# Patient Record
Sex: Male | Born: 1950 | Race: White | Hispanic: No | Smoking: Never smoker
Health system: Southern US, Community
[De-identification: ages and names within clinical notes are randomized; demographics above are authoritative.]

## PROBLEM LIST (undated history)

## (undated) DIAGNOSIS — E119 Type 2 diabetes mellitus without complications: Secondary | ICD-10-CM

## (undated) DIAGNOSIS — F419 Anxiety disorder, unspecified: Secondary | ICD-10-CM

## (undated) HISTORY — DX: Type 2 diabetes mellitus without complications: E11.9

## (undated) HISTORY — DX: Anxiety disorder, unspecified: F41.9

---

## 2000-09-24 ENCOUNTER — Encounter: Payer: Self-pay | Admitting: Nephrology

## 2000-09-24 ENCOUNTER — Encounter: Admission: RE | Admit: 2000-09-24 | Discharge: 2000-09-24 | Payer: Self-pay | Admitting: Nephrology

## 2000-10-01 ENCOUNTER — Encounter: Payer: Self-pay | Admitting: Nephrology

## 2000-10-01 ENCOUNTER — Ambulatory Visit (HOSPITAL_COMMUNITY): Admission: RE | Admit: 2000-10-01 | Discharge: 2000-10-01 | Payer: Self-pay | Admitting: Nephrology

## 2001-08-14 ENCOUNTER — Encounter (HOSPITAL_BASED_OUTPATIENT_CLINIC_OR_DEPARTMENT_OTHER): Payer: Self-pay | Admitting: General Surgery

## 2001-08-19 ENCOUNTER — Ambulatory Visit (HOSPITAL_COMMUNITY): Admission: RE | Admit: 2001-08-19 | Discharge: 2001-08-19 | Payer: Self-pay | Admitting: General Surgery

## 2001-09-13 ENCOUNTER — Encounter: Admission: RE | Admit: 2001-09-13 | Discharge: 2001-09-13 | Payer: Self-pay | Admitting: Nephrology

## 2001-09-13 ENCOUNTER — Encounter: Payer: Self-pay | Admitting: Nephrology

## 2001-10-11 ENCOUNTER — Encounter: Admission: RE | Admit: 2001-10-11 | Discharge: 2001-10-11 | Payer: Self-pay | Admitting: Nephrology

## 2001-10-11 ENCOUNTER — Encounter: Payer: Self-pay | Admitting: Nephrology

## 2011-02-23 ENCOUNTER — Other Ambulatory Visit (HOSPITAL_COMMUNITY): Payer: Self-pay | Admitting: Urology

## 2011-02-23 DIAGNOSIS — N281 Cyst of kidney, acquired: Secondary | ICD-10-CM

## 2011-02-25 ENCOUNTER — Ambulatory Visit (HOSPITAL_COMMUNITY)
Admission: RE | Admit: 2011-02-25 | Discharge: 2011-02-25 | Disposition: A | Discharge status: Home / Self Care | Payer: BC Managed Care – PPO | Source: Ambulatory Visit | Attending: Urology | Admitting: Urology

## 2011-02-25 DIAGNOSIS — K8020 Calculus of gallbladder without cholecystitis without obstruction: Secondary | ICD-10-CM | POA: Insufficient documentation

## 2011-02-25 DIAGNOSIS — Q619 Cystic kidney disease, unspecified: Secondary | ICD-10-CM | POA: Insufficient documentation

## 2011-02-25 DIAGNOSIS — N281 Cyst of kidney, acquired: Secondary | ICD-10-CM

## 2011-02-25 DIAGNOSIS — K7689 Other specified diseases of liver: Secondary | ICD-10-CM | POA: Insufficient documentation

## 2011-02-25 MED ORDER — GADOBENATE DIMEGLUMINE 529 MG/ML IV SOLN
20.0000 mL | Freq: Once | INTRAVENOUS | Status: AC | PRN
  Administered 2011-02-25: 20 mL via INTRAVENOUS

## 2014-03-24 DIAGNOSIS — E785 Hyperlipidemia, unspecified: Secondary | ICD-10-CM | POA: Diagnosis not present

## 2014-03-24 DIAGNOSIS — E119 Type 2 diabetes mellitus without complications: Secondary | ICD-10-CM | POA: Diagnosis not present

## 2014-03-26 DIAGNOSIS — I10 Essential (primary) hypertension: Secondary | ICD-10-CM | POA: Diagnosis not present

## 2014-03-26 DIAGNOSIS — E119 Type 2 diabetes mellitus without complications: Secondary | ICD-10-CM | POA: Diagnosis not present

## 2014-03-26 DIAGNOSIS — E785 Hyperlipidemia, unspecified: Secondary | ICD-10-CM | POA: Diagnosis not present

## 2014-06-23 DIAGNOSIS — Z79899 Other long term (current) drug therapy: Secondary | ICD-10-CM | POA: Diagnosis not present

## 2014-06-23 DIAGNOSIS — E119 Type 2 diabetes mellitus without complications: Secondary | ICD-10-CM | POA: Diagnosis not present

## 2014-06-23 DIAGNOSIS — E785 Hyperlipidemia, unspecified: Secondary | ICD-10-CM | POA: Diagnosis not present

## 2014-06-25 DIAGNOSIS — E119 Type 2 diabetes mellitus without complications: Secondary | ICD-10-CM | POA: Diagnosis not present

## 2014-06-25 DIAGNOSIS — Z23 Encounter for immunization: Secondary | ICD-10-CM | POA: Diagnosis not present

## 2014-06-25 DIAGNOSIS — Z79899 Other long term (current) drug therapy: Secondary | ICD-10-CM | POA: Diagnosis not present

## 2014-06-25 DIAGNOSIS — E782 Mixed hyperlipidemia: Secondary | ICD-10-CM | POA: Diagnosis not present

## 2014-06-25 DIAGNOSIS — I10 Essential (primary) hypertension: Secondary | ICD-10-CM | POA: Diagnosis not present

## 2014-09-03 DIAGNOSIS — Z23 Encounter for immunization: Secondary | ICD-10-CM | POA: Diagnosis not present

## 2014-09-03 DIAGNOSIS — K219 Gastro-esophageal reflux disease without esophagitis: Secondary | ICD-10-CM | POA: Diagnosis not present

## 2014-09-03 DIAGNOSIS — Z125 Encounter for screening for malignant neoplasm of prostate: Secondary | ICD-10-CM | POA: Diagnosis not present

## 2014-09-03 DIAGNOSIS — Z0000 Encounter for general adult medical examination without abnormal findings: Secondary | ICD-10-CM | POA: Diagnosis not present

## 2014-09-03 DIAGNOSIS — Z1211 Encounter for screening for malignant neoplasm of colon: Secondary | ICD-10-CM | POA: Diagnosis not present

## 2014-09-03 DIAGNOSIS — F419 Anxiety disorder, unspecified: Secondary | ICD-10-CM | POA: Diagnosis not present

## 2014-09-14 DIAGNOSIS — Z1211 Encounter for screening for malignant neoplasm of colon: Secondary | ICD-10-CM | POA: Diagnosis not present

## 2014-09-29 DIAGNOSIS — E782 Mixed hyperlipidemia: Secondary | ICD-10-CM | POA: Diagnosis not present

## 2014-09-29 DIAGNOSIS — E119 Type 2 diabetes mellitus without complications: Secondary | ICD-10-CM | POA: Diagnosis not present

## 2014-10-01 DIAGNOSIS — Z79899 Other long term (current) drug therapy: Secondary | ICD-10-CM | POA: Diagnosis not present

## 2014-10-01 DIAGNOSIS — I10 Essential (primary) hypertension: Secondary | ICD-10-CM | POA: Diagnosis not present

## 2014-10-01 DIAGNOSIS — E119 Type 2 diabetes mellitus without complications: Secondary | ICD-10-CM | POA: Diagnosis not present

## 2014-10-01 DIAGNOSIS — E782 Mixed hyperlipidemia: Secondary | ICD-10-CM | POA: Diagnosis not present

## 2014-12-29 DIAGNOSIS — E782 Mixed hyperlipidemia: Secondary | ICD-10-CM | POA: Diagnosis not present

## 2014-12-29 DIAGNOSIS — E119 Type 2 diabetes mellitus without complications: Secondary | ICD-10-CM | POA: Diagnosis not present

## 2015-01-01 DIAGNOSIS — Z79899 Other long term (current) drug therapy: Secondary | ICD-10-CM | POA: Diagnosis not present

## 2015-01-01 DIAGNOSIS — E782 Mixed hyperlipidemia: Secondary | ICD-10-CM | POA: Diagnosis not present

## 2015-01-01 DIAGNOSIS — I10 Essential (primary) hypertension: Secondary | ICD-10-CM | POA: Diagnosis not present

## 2015-01-01 DIAGNOSIS — E119 Type 2 diabetes mellitus without complications: Secondary | ICD-10-CM | POA: Diagnosis not present

## 2015-03-30 DIAGNOSIS — E782 Mixed hyperlipidemia: Secondary | ICD-10-CM | POA: Diagnosis not present

## 2015-03-30 DIAGNOSIS — E119 Type 2 diabetes mellitus without complications: Secondary | ICD-10-CM | POA: Diagnosis not present

## 2015-04-02 DIAGNOSIS — Z79899 Other long term (current) drug therapy: Secondary | ICD-10-CM | POA: Diagnosis not present

## 2015-04-02 DIAGNOSIS — E119 Type 2 diabetes mellitus without complications: Secondary | ICD-10-CM | POA: Diagnosis not present

## 2015-04-02 DIAGNOSIS — I10 Essential (primary) hypertension: Secondary | ICD-10-CM | POA: Diagnosis not present

## 2015-04-02 DIAGNOSIS — E782 Mixed hyperlipidemia: Secondary | ICD-10-CM | POA: Diagnosis not present

## 2015-06-30 DIAGNOSIS — E782 Mixed hyperlipidemia: Secondary | ICD-10-CM | POA: Diagnosis not present

## 2015-06-30 DIAGNOSIS — E119 Type 2 diabetes mellitus without complications: Secondary | ICD-10-CM | POA: Diagnosis not present

## 2015-07-02 DIAGNOSIS — Z23 Encounter for immunization: Secondary | ICD-10-CM | POA: Diagnosis not present

## 2015-07-02 DIAGNOSIS — I10 Essential (primary) hypertension: Secondary | ICD-10-CM | POA: Diagnosis not present

## 2015-07-02 DIAGNOSIS — E119 Type 2 diabetes mellitus without complications: Secondary | ICD-10-CM | POA: Diagnosis not present

## 2015-07-02 DIAGNOSIS — E782 Mixed hyperlipidemia: Secondary | ICD-10-CM | POA: Diagnosis not present

## 2015-07-02 DIAGNOSIS — Z79899 Other long term (current) drug therapy: Secondary | ICD-10-CM | POA: Diagnosis not present

## 2015-09-15 DIAGNOSIS — Z1211 Encounter for screening for malignant neoplasm of colon: Secondary | ICD-10-CM | POA: Diagnosis not present

## 2015-09-15 DIAGNOSIS — Z0000 Encounter for general adult medical examination without abnormal findings: Secondary | ICD-10-CM | POA: Diagnosis not present

## 2015-09-15 DIAGNOSIS — K219 Gastro-esophageal reflux disease without esophagitis: Secondary | ICD-10-CM | POA: Diagnosis not present

## 2015-09-15 DIAGNOSIS — Z125 Encounter for screening for malignant neoplasm of prostate: Secondary | ICD-10-CM | POA: Diagnosis not present

## 2015-09-15 DIAGNOSIS — Z6837 Body mass index (BMI) 37.0-37.9, adult: Secondary | ICD-10-CM | POA: Diagnosis not present

## 2015-09-15 DIAGNOSIS — R109 Unspecified abdominal pain: Secondary | ICD-10-CM | POA: Diagnosis not present

## 2015-09-15 DIAGNOSIS — E669 Obesity, unspecified: Secondary | ICD-10-CM | POA: Diagnosis not present

## 2015-09-15 DIAGNOSIS — F419 Anxiety disorder, unspecified: Secondary | ICD-10-CM | POA: Diagnosis not present

## 2015-09-23 DIAGNOSIS — Z1211 Encounter for screening for malignant neoplasm of colon: Secondary | ICD-10-CM | POA: Diagnosis not present

## 2015-09-28 DIAGNOSIS — E782 Mixed hyperlipidemia: Secondary | ICD-10-CM | POA: Diagnosis not present

## 2015-09-28 DIAGNOSIS — E119 Type 2 diabetes mellitus without complications: Secondary | ICD-10-CM | POA: Diagnosis not present

## 2015-09-30 DIAGNOSIS — I10 Essential (primary) hypertension: Secondary | ICD-10-CM | POA: Diagnosis not present

## 2015-09-30 DIAGNOSIS — E782 Mixed hyperlipidemia: Secondary | ICD-10-CM | POA: Diagnosis not present

## 2015-09-30 DIAGNOSIS — E119 Type 2 diabetes mellitus without complications: Secondary | ICD-10-CM | POA: Diagnosis not present

## 2015-09-30 DIAGNOSIS — Z79899 Other long term (current) drug therapy: Secondary | ICD-10-CM | POA: Diagnosis not present

## 2015-12-28 DIAGNOSIS — Z79899 Other long term (current) drug therapy: Secondary | ICD-10-CM | POA: Diagnosis not present

## 2015-12-28 DIAGNOSIS — E119 Type 2 diabetes mellitus without complications: Secondary | ICD-10-CM | POA: Diagnosis not present

## 2015-12-28 DIAGNOSIS — E782 Mixed hyperlipidemia: Secondary | ICD-10-CM | POA: Diagnosis not present

## 2015-12-30 DIAGNOSIS — E782 Mixed hyperlipidemia: Secondary | ICD-10-CM | POA: Diagnosis not present

## 2015-12-30 DIAGNOSIS — E119 Type 2 diabetes mellitus without complications: Secondary | ICD-10-CM | POA: Diagnosis not present

## 2015-12-30 DIAGNOSIS — I10 Essential (primary) hypertension: Secondary | ICD-10-CM | POA: Diagnosis not present

## 2015-12-30 DIAGNOSIS — Z79899 Other long term (current) drug therapy: Secondary | ICD-10-CM | POA: Diagnosis not present

## 2016-02-03 ENCOUNTER — Ambulatory Visit (INDEPENDENT_AMBULATORY_CARE_PROVIDER_SITE_OTHER): Payer: Medicare Other | Admitting: Podiatry

## 2016-02-03 ENCOUNTER — Encounter: Payer: Self-pay | Admitting: Podiatry

## 2016-02-03 VITALS — BP 135/82 | HR 74 | Resp 12

## 2016-02-03 DIAGNOSIS — L603 Nail dystrophy: Secondary | ICD-10-CM

## 2016-02-03 DIAGNOSIS — B351 Tinea unguium: Secondary | ICD-10-CM | POA: Diagnosis not present

## 2016-02-03 NOTE — Progress Notes (Signed)
   Subjective:    Patient ID: Joe FischerDavid H [name], male    DOB: June 23, 1951, 65 y.o.   MRN: [id number]  HPI: He presents today as a 65 year old white male with a five-year history of diabetes concerned about the discoloration of the hallux nail right. He states that this started approximately 2 years ago with self-inflicted injury to both nail plates bilateral hallux resulting in the nails falling off. He states the left one grew back normal the right one grew back abnormal. He states that it does not hurt however he is concerned about the discoloration. He has tried topicals which seemed to work better on the left nail that has done nothing for the right now. These topicals were over-the-counter.  Review of Systems  Skin: Positive for color change.       Objective:   Physical Exam: Vital signs are stable he is alert and oriented 3. He is in good spirits. Well-nourished. No acute distress. Pulses are strongly palpable. Neurologic sensorium is intact. Deep tendon reflexes are intact. Muscle strength is normal bilateral. Orthopedic evaluation of his resolved joints distal to the ankle level for range of motion without crepitation. Cutaneous evaluation demonstrates supple well-hydrated cutis no erythema edema cellulitis drainage or odor. Hallux nail plate right does demonstrate distal onycholysis with thickening of the nail however the nail itself is irregular there is no surrounding soft tissue involvement such as fungus yeast or mold. Diabetic radiographs were deferred today.        Assessment & Plan:  Assessment: Nail dystrophy hallux right. Diabetes mellitus.  Plan: We took a sample of the skin and nail to be sent for pathologic evaluation I will follow-up with him once his results have returned.

## 2016-02-04 NOTE — Addendum Note (Signed)
Addended by: Alphia Kava'CONNELL, VALERY D on: 02/04/2016 09:22 AM   Modules accepted: Orders

## 2016-03-02 ENCOUNTER — Ambulatory Visit (INDEPENDENT_AMBULATORY_CARE_PROVIDER_SITE_OTHER): Payer: Medicare Other | Admitting: Podiatry

## 2016-03-02 ENCOUNTER — Encounter: Payer: Self-pay | Admitting: Podiatry

## 2016-03-02 DIAGNOSIS — L603 Nail dystrophy: Secondary | ICD-10-CM | POA: Diagnosis not present

## 2016-03-02 NOTE — Patient Instructions (Signed)

## 2016-03-02 NOTE — Progress Notes (Signed)
He presents today for his pathology reports regarding his hallux nail plate right foot.  Objective: Vital signs are stable he is alert and oriented 3. Pathology report does demonstrate mold hallux right.  Assessment: Onychomycosis hallux right.  Plan: At this point he will see do nothing and will consider oral treatment later.

## 2016-03-10 ENCOUNTER — Encounter: Payer: Self-pay | Admitting: Podiatry

## 2016-04-19 DIAGNOSIS — E782 Mixed hyperlipidemia: Secondary | ICD-10-CM | POA: Diagnosis not present

## 2016-04-19 DIAGNOSIS — E119 Type 2 diabetes mellitus without complications: Secondary | ICD-10-CM | POA: Diagnosis not present

## 2016-04-21 DIAGNOSIS — E119 Type 2 diabetes mellitus without complications: Secondary | ICD-10-CM | POA: Diagnosis not present

## 2016-04-21 DIAGNOSIS — I10 Essential (primary) hypertension: Secondary | ICD-10-CM | POA: Diagnosis not present

## 2016-04-21 DIAGNOSIS — Z79899 Other long term (current) drug therapy: Secondary | ICD-10-CM | POA: Diagnosis not present

## 2016-04-21 DIAGNOSIS — E782 Mixed hyperlipidemia: Secondary | ICD-10-CM | POA: Diagnosis not present

## 2016-05-20 DIAGNOSIS — M7711 Lateral epicondylitis, right elbow: Secondary | ICD-10-CM | POA: Diagnosis not present

## 2016-07-25 DIAGNOSIS — E782 Mixed hyperlipidemia: Secondary | ICD-10-CM | POA: Diagnosis not present

## 2016-07-25 DIAGNOSIS — E119 Type 2 diabetes mellitus without complications: Secondary | ICD-10-CM | POA: Diagnosis not present

## 2016-07-27 DIAGNOSIS — Z23 Encounter for immunization: Secondary | ICD-10-CM | POA: Diagnosis not present

## 2016-07-27 DIAGNOSIS — I10 Essential (primary) hypertension: Secondary | ICD-10-CM | POA: Diagnosis not present

## 2016-07-27 DIAGNOSIS — Z79899 Other long term (current) drug therapy: Secondary | ICD-10-CM | POA: Diagnosis not present

## 2016-07-27 DIAGNOSIS — E119 Type 2 diabetes mellitus without complications: Secondary | ICD-10-CM | POA: Diagnosis not present

## 2016-07-27 DIAGNOSIS — E782 Mixed hyperlipidemia: Secondary | ICD-10-CM | POA: Diagnosis not present

## 2016-09-22 DIAGNOSIS — F419 Anxiety disorder, unspecified: Secondary | ICD-10-CM | POA: Diagnosis not present

## 2016-09-22 DIAGNOSIS — Z23 Encounter for immunization: Secondary | ICD-10-CM | POA: Diagnosis not present

## 2016-09-22 DIAGNOSIS — K219 Gastro-esophageal reflux disease without esophagitis: Secondary | ICD-10-CM | POA: Diagnosis not present

## 2016-09-22 DIAGNOSIS — Z0000 Encounter for general adult medical examination without abnormal findings: Secondary | ICD-10-CM | POA: Diagnosis not present

## 2016-09-22 DIAGNOSIS — M2550 Pain in unspecified joint: Secondary | ICD-10-CM | POA: Diagnosis not present

## 2016-09-22 DIAGNOSIS — E669 Obesity, unspecified: Secondary | ICD-10-CM | POA: Diagnosis not present

## 2016-09-22 DIAGNOSIS — Z125 Encounter for screening for malignant neoplasm of prostate: Secondary | ICD-10-CM | POA: Diagnosis not present

## 2016-09-27 DIAGNOSIS — Z1211 Encounter for screening for malignant neoplasm of colon: Secondary | ICD-10-CM | POA: Diagnosis not present

## 2016-09-27 DIAGNOSIS — K5730 Diverticulosis of large intestine without perforation or abscess without bleeding: Secondary | ICD-10-CM | POA: Diagnosis not present

## 2016-10-31 DIAGNOSIS — Z79899 Other long term (current) drug therapy: Secondary | ICD-10-CM | POA: Diagnosis not present

## 2016-10-31 DIAGNOSIS — E782 Mixed hyperlipidemia: Secondary | ICD-10-CM | POA: Diagnosis not present

## 2016-10-31 DIAGNOSIS — E119 Type 2 diabetes mellitus without complications: Secondary | ICD-10-CM | POA: Diagnosis not present

## 2016-11-02 DIAGNOSIS — Z79899 Other long term (current) drug therapy: Secondary | ICD-10-CM | POA: Insufficient documentation

## 2016-11-02 DIAGNOSIS — E119 Type 2 diabetes mellitus without complications: Secondary | ICD-10-CM | POA: Insufficient documentation

## 2016-11-02 DIAGNOSIS — I10 Essential (primary) hypertension: Secondary | ICD-10-CM | POA: Insufficient documentation

## 2016-11-02 DIAGNOSIS — E782 Mixed hyperlipidemia: Secondary | ICD-10-CM | POA: Diagnosis not present

## 2016-11-02 DIAGNOSIS — E669 Obesity, unspecified: Secondary | ICD-10-CM | POA: Diagnosis not present

## 2016-11-02 DIAGNOSIS — E1165 Type 2 diabetes mellitus with hyperglycemia: Secondary | ICD-10-CM | POA: Insufficient documentation

## 2016-11-02 DIAGNOSIS — Z794 Long term (current) use of insulin: Secondary | ICD-10-CM | POA: Diagnosis not present

## 2016-11-02 DIAGNOSIS — Z6839 Body mass index (BMI) 39.0-39.9, adult: Secondary | ICD-10-CM | POA: Diagnosis not present

## 2017-02-06 DIAGNOSIS — E119 Type 2 diabetes mellitus without complications: Secondary | ICD-10-CM | POA: Diagnosis not present

## 2017-02-06 DIAGNOSIS — E782 Mixed hyperlipidemia: Secondary | ICD-10-CM | POA: Diagnosis not present

## 2017-02-08 DIAGNOSIS — E782 Mixed hyperlipidemia: Secondary | ICD-10-CM | POA: Diagnosis not present

## 2017-02-08 DIAGNOSIS — I10 Essential (primary) hypertension: Secondary | ICD-10-CM | POA: Diagnosis not present

## 2017-02-08 DIAGNOSIS — Z79899 Other long term (current) drug therapy: Secondary | ICD-10-CM | POA: Diagnosis not present

## 2017-02-08 DIAGNOSIS — Z6838 Body mass index (BMI) 38.0-38.9, adult: Secondary | ICD-10-CM | POA: Diagnosis not present

## 2017-02-08 DIAGNOSIS — E669 Obesity, unspecified: Secondary | ICD-10-CM | POA: Diagnosis not present

## 2017-02-08 DIAGNOSIS — E119 Type 2 diabetes mellitus without complications: Secondary | ICD-10-CM | POA: Diagnosis not present

## 2017-05-22 DIAGNOSIS — E782 Mixed hyperlipidemia: Secondary | ICD-10-CM | POA: Diagnosis not present

## 2017-05-22 DIAGNOSIS — E119 Type 2 diabetes mellitus without complications: Secondary | ICD-10-CM | POA: Diagnosis not present

## 2017-05-25 DIAGNOSIS — Z79899 Other long term (current) drug therapy: Secondary | ICD-10-CM | POA: Diagnosis not present

## 2017-05-25 DIAGNOSIS — Z6835 Body mass index (BMI) 35.0-35.9, adult: Secondary | ICD-10-CM | POA: Diagnosis not present

## 2017-05-25 DIAGNOSIS — I10 Essential (primary) hypertension: Secondary | ICD-10-CM | POA: Diagnosis not present

## 2017-05-25 DIAGNOSIS — E782 Mixed hyperlipidemia: Secondary | ICD-10-CM | POA: Diagnosis not present

## 2017-05-25 DIAGNOSIS — E119 Type 2 diabetes mellitus without complications: Secondary | ICD-10-CM | POA: Diagnosis not present

## 2017-05-25 DIAGNOSIS — E669 Obesity, unspecified: Secondary | ICD-10-CM | POA: Diagnosis not present

## 2017-05-25 DIAGNOSIS — Z23 Encounter for immunization: Secondary | ICD-10-CM | POA: Diagnosis not present

## 2017-08-23 DIAGNOSIS — E119 Type 2 diabetes mellitus without complications: Secondary | ICD-10-CM | POA: Diagnosis not present

## 2017-08-23 DIAGNOSIS — E782 Mixed hyperlipidemia: Secondary | ICD-10-CM | POA: Diagnosis not present

## 2017-08-31 DIAGNOSIS — E782 Mixed hyperlipidemia: Secondary | ICD-10-CM | POA: Diagnosis not present

## 2017-08-31 DIAGNOSIS — I10 Essential (primary) hypertension: Secondary | ICD-10-CM | POA: Diagnosis not present

## 2017-08-31 DIAGNOSIS — Z79899 Other long term (current) drug therapy: Secondary | ICD-10-CM | POA: Diagnosis not present

## 2017-08-31 DIAGNOSIS — E119 Type 2 diabetes mellitus without complications: Secondary | ICD-10-CM | POA: Diagnosis not present

## 2017-10-12 DIAGNOSIS — Z0000 Encounter for general adult medical examination without abnormal findings: Secondary | ICD-10-CM | POA: Diagnosis not present

## 2017-10-12 DIAGNOSIS — K219 Gastro-esophageal reflux disease without esophagitis: Secondary | ICD-10-CM | POA: Diagnosis not present

## 2017-10-12 DIAGNOSIS — Z1159 Encounter for screening for other viral diseases: Secondary | ICD-10-CM | POA: Diagnosis not present

## 2017-10-12 DIAGNOSIS — E669 Obesity, unspecified: Secondary | ICD-10-CM | POA: Diagnosis not present

## 2017-10-12 DIAGNOSIS — F419 Anxiety disorder, unspecified: Secondary | ICD-10-CM | POA: Diagnosis not present

## 2017-10-12 DIAGNOSIS — Z125 Encounter for screening for malignant neoplasm of prostate: Secondary | ICD-10-CM | POA: Diagnosis not present

## 2017-10-12 DIAGNOSIS — M2550 Pain in unspecified joint: Secondary | ICD-10-CM | POA: Diagnosis not present

## 2017-11-26 DIAGNOSIS — E782 Mixed hyperlipidemia: Secondary | ICD-10-CM | POA: Diagnosis not present

## 2017-11-26 DIAGNOSIS — E119 Type 2 diabetes mellitus without complications: Secondary | ICD-10-CM | POA: Diagnosis not present

## 2017-11-30 DIAGNOSIS — Z79899 Other long term (current) drug therapy: Secondary | ICD-10-CM | POA: Diagnosis not present

## 2017-11-30 DIAGNOSIS — E119 Type 2 diabetes mellitus without complications: Secondary | ICD-10-CM | POA: Diagnosis not present

## 2017-11-30 DIAGNOSIS — E669 Obesity, unspecified: Secondary | ICD-10-CM | POA: Diagnosis not present

## 2017-11-30 DIAGNOSIS — Z7984 Long term (current) use of oral hypoglycemic drugs: Secondary | ICD-10-CM | POA: Diagnosis not present

## 2017-11-30 DIAGNOSIS — I10 Essential (primary) hypertension: Secondary | ICD-10-CM | POA: Diagnosis not present

## 2017-11-30 DIAGNOSIS — E782 Mixed hyperlipidemia: Secondary | ICD-10-CM | POA: Diagnosis not present

## 2017-11-30 DIAGNOSIS — Z6835 Body mass index (BMI) 35.0-35.9, adult: Secondary | ICD-10-CM | POA: Diagnosis not present

## 2018-02-18 DIAGNOSIS — E119 Type 2 diabetes mellitus without complications: Secondary | ICD-10-CM | POA: Diagnosis not present

## 2018-02-18 DIAGNOSIS — E782 Mixed hyperlipidemia: Secondary | ICD-10-CM | POA: Diagnosis not present

## 2018-02-18 DIAGNOSIS — Z79899 Other long term (current) drug therapy: Secondary | ICD-10-CM | POA: Diagnosis not present

## 2018-02-21 DIAGNOSIS — Z7984 Long term (current) use of oral hypoglycemic drugs: Secondary | ICD-10-CM | POA: Diagnosis not present

## 2018-02-21 DIAGNOSIS — I10 Essential (primary) hypertension: Secondary | ICD-10-CM | POA: Diagnosis not present

## 2018-02-21 DIAGNOSIS — E669 Obesity, unspecified: Secondary | ICD-10-CM | POA: Diagnosis not present

## 2018-02-21 DIAGNOSIS — E119 Type 2 diabetes mellitus without complications: Secondary | ICD-10-CM | POA: Diagnosis not present

## 2018-02-21 DIAGNOSIS — E782 Mixed hyperlipidemia: Secondary | ICD-10-CM | POA: Diagnosis not present

## 2018-02-21 DIAGNOSIS — Z79899 Other long term (current) drug therapy: Secondary | ICD-10-CM | POA: Diagnosis not present

## 2018-02-21 DIAGNOSIS — Z6835 Body mass index (BMI) 35.0-35.9, adult: Secondary | ICD-10-CM | POA: Diagnosis not present

## 2018-05-24 DIAGNOSIS — E119 Type 2 diabetes mellitus without complications: Secondary | ICD-10-CM | POA: Diagnosis not present

## 2018-05-24 DIAGNOSIS — E782 Mixed hyperlipidemia: Secondary | ICD-10-CM | POA: Diagnosis not present

## 2018-05-28 DIAGNOSIS — Z23 Encounter for immunization: Secondary | ICD-10-CM | POA: Diagnosis not present

## 2018-05-28 DIAGNOSIS — I10 Essential (primary) hypertension: Secondary | ICD-10-CM | POA: Diagnosis not present

## 2018-05-28 DIAGNOSIS — E782 Mixed hyperlipidemia: Secondary | ICD-10-CM | POA: Diagnosis not present

## 2018-05-28 DIAGNOSIS — Z79899 Other long term (current) drug therapy: Secondary | ICD-10-CM | POA: Diagnosis not present

## 2018-05-28 DIAGNOSIS — E119 Type 2 diabetes mellitus without complications: Secondary | ICD-10-CM | POA: Diagnosis not present

## 2018-08-29 DIAGNOSIS — E119 Type 2 diabetes mellitus without complications: Secondary | ICD-10-CM | POA: Diagnosis not present

## 2018-08-29 DIAGNOSIS — E782 Mixed hyperlipidemia: Secondary | ICD-10-CM | POA: Diagnosis not present

## 2018-09-03 DIAGNOSIS — Z79899 Other long term (current) drug therapy: Secondary | ICD-10-CM | POA: Diagnosis not present

## 2018-09-03 DIAGNOSIS — E119 Type 2 diabetes mellitus without complications: Secondary | ICD-10-CM | POA: Diagnosis not present

## 2018-09-03 DIAGNOSIS — E782 Mixed hyperlipidemia: Secondary | ICD-10-CM | POA: Diagnosis not present

## 2018-09-03 DIAGNOSIS — I10 Essential (primary) hypertension: Secondary | ICD-10-CM | POA: Diagnosis not present

## 2018-10-01 ENCOUNTER — Ambulatory Visit
Admission: RE | Admit: 2018-10-01 | Discharge: 2018-10-01 | Disposition: A | Discharge status: Home / Self Care | Payer: Medicare Other | Source: Ambulatory Visit | Attending: Family Medicine | Admitting: Family Medicine

## 2018-10-01 ENCOUNTER — Other Ambulatory Visit: Payer: Self-pay | Admitting: Family Medicine

## 2018-10-01 DIAGNOSIS — R1013 Epigastric pain: Secondary | ICD-10-CM

## 2018-10-01 DIAGNOSIS — K8020 Calculus of gallbladder without cholecystitis without obstruction: Secondary | ICD-10-CM | POA: Diagnosis not present

## 2018-11-12 DIAGNOSIS — F419 Anxiety disorder, unspecified: Secondary | ICD-10-CM | POA: Diagnosis not present

## 2018-11-12 DIAGNOSIS — M2550 Pain in unspecified joint: Secondary | ICD-10-CM | POA: Diagnosis not present

## 2018-11-12 DIAGNOSIS — E669 Obesity, unspecified: Secondary | ICD-10-CM | POA: Diagnosis not present

## 2018-11-12 DIAGNOSIS — Z0000 Encounter for general adult medical examination without abnormal findings: Secondary | ICD-10-CM | POA: Diagnosis not present

## 2018-11-12 DIAGNOSIS — K219 Gastro-esophageal reflux disease without esophagitis: Secondary | ICD-10-CM | POA: Diagnosis not present

## 2019-02-18 DIAGNOSIS — E782 Mixed hyperlipidemia: Secondary | ICD-10-CM | POA: Diagnosis not present

## 2019-02-18 DIAGNOSIS — E119 Type 2 diabetes mellitus without complications: Secondary | ICD-10-CM | POA: Diagnosis not present

## 2019-02-24 DIAGNOSIS — I10 Essential (primary) hypertension: Secondary | ICD-10-CM | POA: Diagnosis not present

## 2019-02-24 DIAGNOSIS — E782 Mixed hyperlipidemia: Secondary | ICD-10-CM | POA: Diagnosis not present

## 2019-02-24 DIAGNOSIS — E119 Type 2 diabetes mellitus without complications: Secondary | ICD-10-CM | POA: Diagnosis not present

## 2019-02-24 DIAGNOSIS — Z79899 Other long term (current) drug therapy: Secondary | ICD-10-CM | POA: Diagnosis not present

## 2019-05-29 DIAGNOSIS — E119 Type 2 diabetes mellitus without complications: Secondary | ICD-10-CM | POA: Diagnosis not present

## 2019-05-29 DIAGNOSIS — Z79899 Other long term (current) drug therapy: Secondary | ICD-10-CM | POA: Diagnosis not present

## 2019-05-29 DIAGNOSIS — E782 Mixed hyperlipidemia: Secondary | ICD-10-CM | POA: Diagnosis not present

## 2019-06-02 DIAGNOSIS — I10 Essential (primary) hypertension: Secondary | ICD-10-CM | POA: Diagnosis not present

## 2019-06-02 DIAGNOSIS — Z23 Encounter for immunization: Secondary | ICD-10-CM | POA: Diagnosis not present

## 2019-06-02 DIAGNOSIS — E119 Type 2 diabetes mellitus without complications: Secondary | ICD-10-CM | POA: Diagnosis not present

## 2019-06-02 DIAGNOSIS — Z7984 Long term (current) use of oral hypoglycemic drugs: Secondary | ICD-10-CM | POA: Diagnosis not present

## 2019-06-02 DIAGNOSIS — E782 Mixed hyperlipidemia: Secondary | ICD-10-CM | POA: Diagnosis not present

## 2019-07-29 DIAGNOSIS — E119 Type 2 diabetes mellitus without complications: Secondary | ICD-10-CM | POA: Diagnosis not present

## 2019-09-02 DIAGNOSIS — E119 Type 2 diabetes mellitus without complications: Secondary | ICD-10-CM | POA: Diagnosis not present

## 2019-09-02 DIAGNOSIS — E782 Mixed hyperlipidemia: Secondary | ICD-10-CM | POA: Diagnosis not present

## 2019-09-04 DIAGNOSIS — I10 Essential (primary) hypertension: Secondary | ICD-10-CM | POA: Diagnosis not present

## 2019-09-04 DIAGNOSIS — E782 Mixed hyperlipidemia: Secondary | ICD-10-CM | POA: Diagnosis not present

## 2019-09-04 DIAGNOSIS — E119 Type 2 diabetes mellitus without complications: Secondary | ICD-10-CM | POA: Diagnosis not present

## 2019-09-04 DIAGNOSIS — K760 Fatty (change of) liver, not elsewhere classified: Secondary | ICD-10-CM | POA: Insufficient documentation

## 2019-09-04 DIAGNOSIS — Z79899 Other long term (current) drug therapy: Secondary | ICD-10-CM | POA: Diagnosis not present

## 2019-11-19 DIAGNOSIS — F419 Anxiety disorder, unspecified: Secondary | ICD-10-CM | POA: Diagnosis not present

## 2019-11-19 DIAGNOSIS — Z0000 Encounter for general adult medical examination without abnormal findings: Secondary | ICD-10-CM | POA: Diagnosis not present

## 2019-11-19 DIAGNOSIS — K219 Gastro-esophageal reflux disease without esophagitis: Secondary | ICD-10-CM | POA: Diagnosis not present

## 2019-11-19 DIAGNOSIS — E669 Obesity, unspecified: Secondary | ICD-10-CM | POA: Diagnosis not present

## 2019-11-19 DIAGNOSIS — Z125 Encounter for screening for malignant neoplasm of prostate: Secondary | ICD-10-CM | POA: Diagnosis not present

## 2019-11-19 DIAGNOSIS — M2550 Pain in unspecified joint: Secondary | ICD-10-CM | POA: Diagnosis not present

## 2019-12-02 DIAGNOSIS — E119 Type 2 diabetes mellitus without complications: Secondary | ICD-10-CM | POA: Diagnosis not present

## 2019-12-02 DIAGNOSIS — E782 Mixed hyperlipidemia: Secondary | ICD-10-CM | POA: Diagnosis not present

## 2019-12-04 DIAGNOSIS — I10 Essential (primary) hypertension: Secondary | ICD-10-CM | POA: Diagnosis not present

## 2019-12-04 DIAGNOSIS — E782 Mixed hyperlipidemia: Secondary | ICD-10-CM | POA: Diagnosis not present

## 2019-12-04 DIAGNOSIS — Z7984 Long term (current) use of oral hypoglycemic drugs: Secondary | ICD-10-CM | POA: Diagnosis not present

## 2019-12-04 DIAGNOSIS — E119 Type 2 diabetes mellitus without complications: Secondary | ICD-10-CM | POA: Diagnosis not present

## 2019-12-04 DIAGNOSIS — K760 Fatty (change of) liver, not elsewhere classified: Secondary | ICD-10-CM | POA: Diagnosis not present

## 2020-03-04 DIAGNOSIS — Z6837 Body mass index (BMI) 37.0-37.9, adult: Secondary | ICD-10-CM | POA: Diagnosis not present

## 2020-03-04 DIAGNOSIS — Z7984 Long term (current) use of oral hypoglycemic drugs: Secondary | ICD-10-CM | POA: Diagnosis not present

## 2020-03-04 DIAGNOSIS — I10 Essential (primary) hypertension: Secondary | ICD-10-CM | POA: Diagnosis not present

## 2020-03-04 DIAGNOSIS — E119 Type 2 diabetes mellitus without complications: Secondary | ICD-10-CM | POA: Diagnosis not present

## 2020-03-04 DIAGNOSIS — E782 Mixed hyperlipidemia: Secondary | ICD-10-CM | POA: Diagnosis not present

## 2020-03-04 DIAGNOSIS — E669 Obesity, unspecified: Secondary | ICD-10-CM | POA: Diagnosis not present

## 2020-04-22 IMAGING — CR DG ABDOMEN 2V
2 series · 2 of 2 positions shown · non-contrast
Comparison: MRI abdomen 02/25/2011

CLINICAL DATA: Epigastric pain for 4 days. Diarrhea. History of
diabetes.

EXAM:
ABDOMEN - 2 VIEW

[w abdomen upright *]
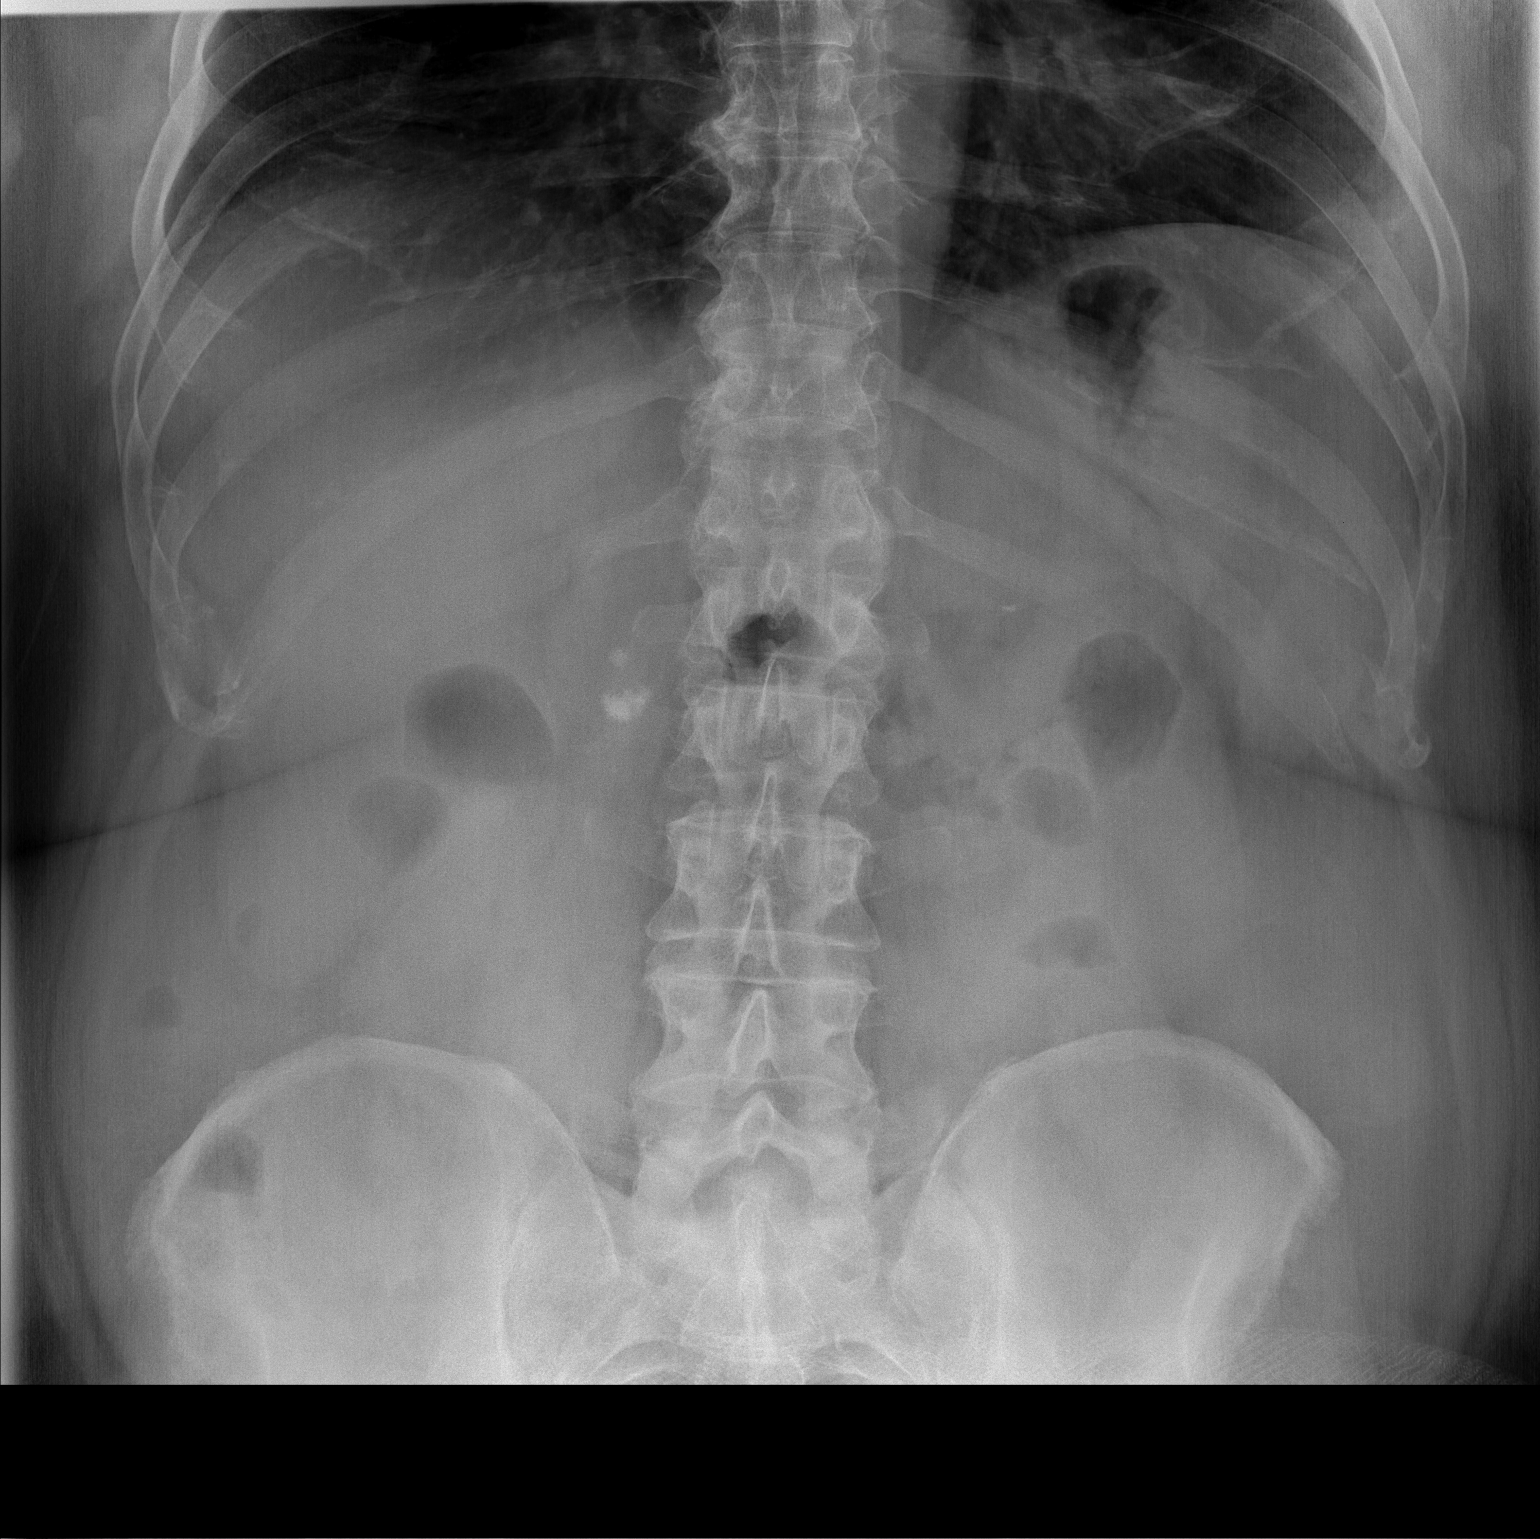

[t abdomen supine]
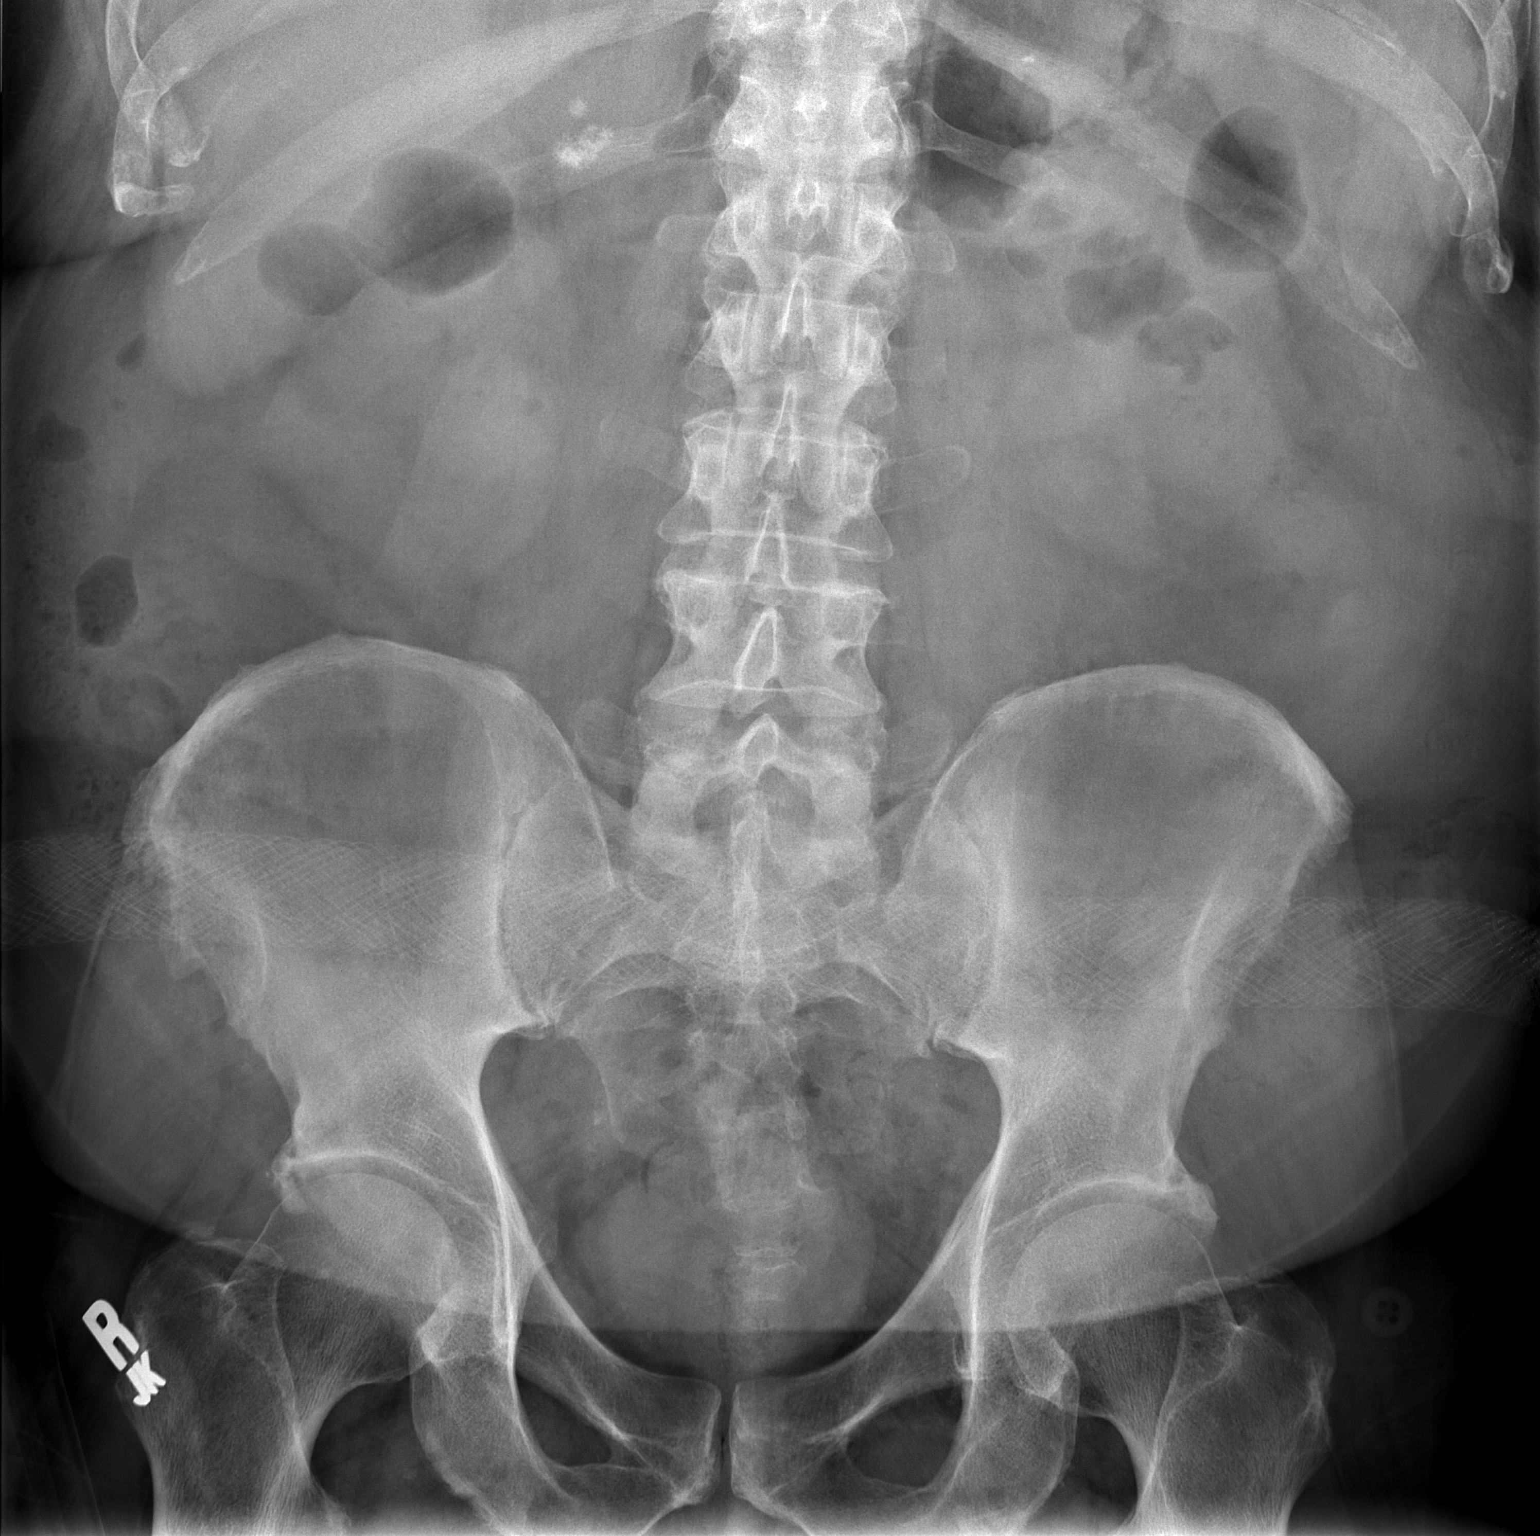

[2 of 2 positions shown; findings below may reference images not displayed]

FINDINGS: Scattered gas and stool throughout the colon. No small or large
bowel distention. No free intra-abdominal air. No abnormal air-fluid
levels. Calcifications in the right upper quadrant likely
representing gallstones. Mild degenerative changes in the spine and
hips. Soft tissue contours appear intact.
IMPRESSION: Nonobstructive bowel gas pattern.  Cholelithiasis.

## 2020-05-22 DIAGNOSIS — Z23 Encounter for immunization: Secondary | ICD-10-CM | POA: Diagnosis not present

## 2020-06-07 DIAGNOSIS — Z79899 Other long term (current) drug therapy: Secondary | ICD-10-CM | POA: Diagnosis not present

## 2020-06-07 DIAGNOSIS — I10 Essential (primary) hypertension: Secondary | ICD-10-CM | POA: Diagnosis not present

## 2020-06-07 DIAGNOSIS — E782 Mixed hyperlipidemia: Secondary | ICD-10-CM | POA: Diagnosis not present

## 2020-06-07 DIAGNOSIS — K760 Fatty (change of) liver, not elsewhere classified: Secondary | ICD-10-CM | POA: Diagnosis not present

## 2020-06-07 DIAGNOSIS — E119 Type 2 diabetes mellitus without complications: Secondary | ICD-10-CM | POA: Diagnosis not present

## 2020-06-09 DIAGNOSIS — E782 Mixed hyperlipidemia: Secondary | ICD-10-CM | POA: Diagnosis not present

## 2020-06-09 DIAGNOSIS — K760 Fatty (change of) liver, not elsewhere classified: Secondary | ICD-10-CM | POA: Diagnosis not present

## 2020-06-09 DIAGNOSIS — E669 Obesity, unspecified: Secondary | ICD-10-CM | POA: Diagnosis not present

## 2020-06-09 DIAGNOSIS — Z7984 Long term (current) use of oral hypoglycemic drugs: Secondary | ICD-10-CM | POA: Diagnosis not present

## 2020-06-09 DIAGNOSIS — I10 Essential (primary) hypertension: Secondary | ICD-10-CM | POA: Diagnosis not present

## 2020-06-09 DIAGNOSIS — E1169 Type 2 diabetes mellitus with other specified complication: Secondary | ICD-10-CM | POA: Diagnosis not present

## 2020-08-03 DIAGNOSIS — E119 Type 2 diabetes mellitus without complications: Secondary | ICD-10-CM | POA: Diagnosis not present

## 2020-09-14 DIAGNOSIS — E782 Mixed hyperlipidemia: Secondary | ICD-10-CM | POA: Diagnosis not present

## 2020-09-14 DIAGNOSIS — E119 Type 2 diabetes mellitus without complications: Secondary | ICD-10-CM | POA: Diagnosis not present

## 2020-09-17 DIAGNOSIS — E119 Type 2 diabetes mellitus without complications: Secondary | ICD-10-CM | POA: Diagnosis not present

## 2020-09-17 DIAGNOSIS — E782 Mixed hyperlipidemia: Secondary | ICD-10-CM | POA: Diagnosis not present

## 2020-09-17 DIAGNOSIS — I10 Essential (primary) hypertension: Secondary | ICD-10-CM | POA: Diagnosis not present

## 2020-09-17 DIAGNOSIS — Z7984 Long term (current) use of oral hypoglycemic drugs: Secondary | ICD-10-CM | POA: Diagnosis not present

## 2020-09-17 DIAGNOSIS — K760 Fatty (change of) liver, not elsewhere classified: Secondary | ICD-10-CM | POA: Diagnosis not present

## 2020-11-19 DIAGNOSIS — I10 Essential (primary) hypertension: Secondary | ICD-10-CM | POA: Diagnosis not present

## 2020-11-19 DIAGNOSIS — M2550 Pain in unspecified joint: Secondary | ICD-10-CM | POA: Diagnosis not present

## 2020-11-19 DIAGNOSIS — F419 Anxiety disorder, unspecified: Secondary | ICD-10-CM | POA: Diagnosis not present

## 2020-11-19 DIAGNOSIS — K219 Gastro-esophageal reflux disease without esophagitis: Secondary | ICD-10-CM | POA: Diagnosis not present

## 2020-11-19 DIAGNOSIS — Z0000 Encounter for general adult medical examination without abnormal findings: Secondary | ICD-10-CM | POA: Diagnosis not present

## 2020-12-20 DIAGNOSIS — E119 Type 2 diabetes mellitus without complications: Secondary | ICD-10-CM | POA: Diagnosis not present

## 2020-12-20 DIAGNOSIS — I10 Essential (primary) hypertension: Secondary | ICD-10-CM | POA: Diagnosis not present

## 2020-12-20 DIAGNOSIS — K760 Fatty (change of) liver, not elsewhere classified: Secondary | ICD-10-CM | POA: Diagnosis not present

## 2020-12-20 DIAGNOSIS — E782 Mixed hyperlipidemia: Secondary | ICD-10-CM | POA: Diagnosis not present

## 2020-12-22 DIAGNOSIS — K760 Fatty (change of) liver, not elsewhere classified: Secondary | ICD-10-CM | POA: Diagnosis not present

## 2020-12-22 DIAGNOSIS — E119 Type 2 diabetes mellitus without complications: Secondary | ICD-10-CM | POA: Diagnosis not present

## 2020-12-22 DIAGNOSIS — I10 Essential (primary) hypertension: Secondary | ICD-10-CM | POA: Diagnosis not present

## 2020-12-22 DIAGNOSIS — E782 Mixed hyperlipidemia: Secondary | ICD-10-CM | POA: Diagnosis not present

## 2020-12-22 DIAGNOSIS — Z7984 Long term (current) use of oral hypoglycemic drugs: Secondary | ICD-10-CM | POA: Diagnosis not present

## 2021-04-21 DIAGNOSIS — I10 Essential (primary) hypertension: Secondary | ICD-10-CM | POA: Diagnosis not present

## 2021-04-21 DIAGNOSIS — E119 Type 2 diabetes mellitus without complications: Secondary | ICD-10-CM | POA: Diagnosis not present

## 2021-04-21 DIAGNOSIS — E782 Mixed hyperlipidemia: Secondary | ICD-10-CM | POA: Diagnosis not present

## 2021-04-21 DIAGNOSIS — K760 Fatty (change of) liver, not elsewhere classified: Secondary | ICD-10-CM | POA: Diagnosis not present

## 2021-04-26 DIAGNOSIS — E1169 Type 2 diabetes mellitus with other specified complication: Secondary | ICD-10-CM | POA: Diagnosis not present

## 2021-04-26 DIAGNOSIS — E669 Obesity, unspecified: Secondary | ICD-10-CM | POA: Diagnosis not present

## 2021-04-26 DIAGNOSIS — E785 Hyperlipidemia, unspecified: Secondary | ICD-10-CM | POA: Diagnosis not present

## 2021-04-26 DIAGNOSIS — Z7984 Long term (current) use of oral hypoglycemic drugs: Secondary | ICD-10-CM | POA: Diagnosis not present

## 2021-04-26 DIAGNOSIS — Z6837 Body mass index (BMI) 37.0-37.9, adult: Secondary | ICD-10-CM | POA: Diagnosis not present

## 2021-04-26 DIAGNOSIS — K760 Fatty (change of) liver, not elsewhere classified: Secondary | ICD-10-CM | POA: Diagnosis not present

## 2021-04-26 DIAGNOSIS — I10 Essential (primary) hypertension: Secondary | ICD-10-CM | POA: Diagnosis not present

## 2021-05-10 DIAGNOSIS — Z23 Encounter for immunization: Secondary | ICD-10-CM | POA: Diagnosis not present

## 2021-07-26 DIAGNOSIS — M545 Low back pain, unspecified: Secondary | ICD-10-CM | POA: Diagnosis not present

## 2021-08-17 DIAGNOSIS — E119 Type 2 diabetes mellitus without complications: Secondary | ICD-10-CM | POA: Diagnosis not present

## 2021-09-07 DIAGNOSIS — Z79899 Other long term (current) drug therapy: Secondary | ICD-10-CM | POA: Diagnosis not present

## 2021-09-07 DIAGNOSIS — K760 Fatty (change of) liver, not elsewhere classified: Secondary | ICD-10-CM | POA: Diagnosis not present

## 2021-09-07 DIAGNOSIS — E1169 Type 2 diabetes mellitus with other specified complication: Secondary | ICD-10-CM | POA: Diagnosis not present

## 2021-09-07 DIAGNOSIS — E785 Hyperlipidemia, unspecified: Secondary | ICD-10-CM | POA: Diagnosis not present

## 2021-09-09 DIAGNOSIS — E669 Obesity, unspecified: Secondary | ICD-10-CM | POA: Diagnosis not present

## 2021-09-09 DIAGNOSIS — E1169 Type 2 diabetes mellitus with other specified complication: Secondary | ICD-10-CM | POA: Diagnosis not present

## 2021-09-09 DIAGNOSIS — Z7985 Long-term (current) use of injectable non-insulin antidiabetic drugs: Secondary | ICD-10-CM | POA: Diagnosis not present

## 2021-09-09 DIAGNOSIS — Z6837 Body mass index (BMI) 37.0-37.9, adult: Secondary | ICD-10-CM | POA: Diagnosis not present

## 2021-09-09 DIAGNOSIS — Z87891 Personal history of nicotine dependence: Secondary | ICD-10-CM | POA: Diagnosis not present

## 2021-09-09 DIAGNOSIS — I152 Hypertension secondary to endocrine disorders: Secondary | ICD-10-CM | POA: Diagnosis not present

## 2021-09-09 DIAGNOSIS — K760 Fatty (change of) liver, not elsewhere classified: Secondary | ICD-10-CM | POA: Diagnosis not present

## 2021-09-09 DIAGNOSIS — E785 Hyperlipidemia, unspecified: Secondary | ICD-10-CM | POA: Diagnosis not present

## 2021-09-09 DIAGNOSIS — Z7984 Long term (current) use of oral hypoglycemic drugs: Secondary | ICD-10-CM | POA: Diagnosis not present

## 2021-09-09 DIAGNOSIS — E1159 Type 2 diabetes mellitus with other circulatory complications: Secondary | ICD-10-CM | POA: Diagnosis not present

## 2021-09-16 ENCOUNTER — Other Ambulatory Visit: Payer: Self-pay | Admitting: Internal Medicine

## 2021-09-16 DIAGNOSIS — E1169 Type 2 diabetes mellitus with other specified complication: Secondary | ICD-10-CM

## 2021-09-16 DIAGNOSIS — K760 Fatty (change of) liver, not elsewhere classified: Secondary | ICD-10-CM

## 2021-09-29 ENCOUNTER — Other Ambulatory Visit: Payer: PRIVATE HEALTH INSURANCE

## 2021-10-05 ENCOUNTER — Other Ambulatory Visit: Payer: Self-pay

## 2021-10-05 ENCOUNTER — Ambulatory Visit
Admission: RE | Admit: 2021-10-05 | Discharge: 2021-10-05 | Disposition: A | Discharge status: Home / Self Care | Payer: Medicare Other | Source: Ambulatory Visit | Attending: Internal Medicine | Admitting: Internal Medicine

## 2021-10-05 DIAGNOSIS — E1169 Type 2 diabetes mellitus with other specified complication: Secondary | ICD-10-CM

## 2021-10-05 DIAGNOSIS — K760 Fatty (change of) liver, not elsewhere classified: Secondary | ICD-10-CM

## 2021-10-05 DIAGNOSIS — K7689 Other specified diseases of liver: Secondary | ICD-10-CM | POA: Diagnosis not present

## 2021-11-15 DIAGNOSIS — Z7985 Long-term (current) use of injectable non-insulin antidiabetic drugs: Secondary | ICD-10-CM | POA: Diagnosis not present

## 2021-11-15 DIAGNOSIS — E1169 Type 2 diabetes mellitus with other specified complication: Secondary | ICD-10-CM | POA: Diagnosis not present

## 2021-11-15 DIAGNOSIS — Z87891 Personal history of nicotine dependence: Secondary | ICD-10-CM | POA: Diagnosis not present

## 2021-11-15 DIAGNOSIS — K760 Fatty (change of) liver, not elsewhere classified: Secondary | ICD-10-CM | POA: Diagnosis not present

## 2021-11-15 DIAGNOSIS — I10 Essential (primary) hypertension: Secondary | ICD-10-CM | POA: Diagnosis not present

## 2021-11-15 DIAGNOSIS — E785 Hyperlipidemia, unspecified: Secondary | ICD-10-CM | POA: Diagnosis not present

## 2021-11-15 DIAGNOSIS — Z7984 Long term (current) use of oral hypoglycemic drugs: Secondary | ICD-10-CM | POA: Diagnosis not present

## 2021-11-15 DIAGNOSIS — E119 Type 2 diabetes mellitus without complications: Secondary | ICD-10-CM | POA: Diagnosis not present

## 2021-11-15 DIAGNOSIS — E669 Obesity, unspecified: Secondary | ICD-10-CM | POA: Diagnosis not present

## 2021-11-15 DIAGNOSIS — F419 Anxiety disorder, unspecified: Secondary | ICD-10-CM | POA: Diagnosis not present

## 2021-11-15 DIAGNOSIS — K219 Gastro-esophageal reflux disease without esophagitis: Secondary | ICD-10-CM | POA: Diagnosis not present

## 2021-11-15 DIAGNOSIS — K7460 Unspecified cirrhosis of liver: Secondary | ICD-10-CM | POA: Diagnosis not present

## 2021-11-15 DIAGNOSIS — R7989 Other specified abnormal findings of blood chemistry: Secondary | ICD-10-CM | POA: Diagnosis not present

## 2021-11-15 DIAGNOSIS — Z6836 Body mass index (BMI) 36.0-36.9, adult: Secondary | ICD-10-CM | POA: Diagnosis not present

## 2021-11-15 DIAGNOSIS — Z7289 Other problems related to lifestyle: Secondary | ICD-10-CM | POA: Diagnosis not present

## 2021-11-15 DIAGNOSIS — Z1159 Encounter for screening for other viral diseases: Secondary | ICD-10-CM | POA: Diagnosis not present

## 2021-11-24 DIAGNOSIS — Z23 Encounter for immunization: Secondary | ICD-10-CM | POA: Diagnosis not present

## 2021-11-24 DIAGNOSIS — I10 Essential (primary) hypertension: Secondary | ICD-10-CM | POA: Diagnosis not present

## 2021-11-24 DIAGNOSIS — M2550 Pain in unspecified joint: Secondary | ICD-10-CM | POA: Diagnosis not present

## 2021-11-24 DIAGNOSIS — Z0000 Encounter for general adult medical examination without abnormal findings: Secondary | ICD-10-CM | POA: Diagnosis not present

## 2021-11-24 DIAGNOSIS — F419 Anxiety disorder, unspecified: Secondary | ICD-10-CM | POA: Diagnosis not present

## 2021-11-24 DIAGNOSIS — K219 Gastro-esophageal reflux disease without esophagitis: Secondary | ICD-10-CM | POA: Diagnosis not present

## 2021-12-20 DIAGNOSIS — I152 Hypertension secondary to endocrine disorders: Secondary | ICD-10-CM | POA: Diagnosis not present

## 2021-12-20 DIAGNOSIS — Z7984 Long term (current) use of oral hypoglycemic drugs: Secondary | ICD-10-CM | POA: Diagnosis not present

## 2021-12-20 DIAGNOSIS — E1169 Type 2 diabetes mellitus with other specified complication: Secondary | ICD-10-CM | POA: Diagnosis not present

## 2021-12-20 DIAGNOSIS — E785 Hyperlipidemia, unspecified: Secondary | ICD-10-CM | POA: Diagnosis not present

## 2021-12-20 DIAGNOSIS — Z7985 Long-term (current) use of injectable non-insulin antidiabetic drugs: Secondary | ICD-10-CM | POA: Diagnosis not present

## 2021-12-20 DIAGNOSIS — Z6836 Body mass index (BMI) 36.0-36.9, adult: Secondary | ICD-10-CM | POA: Diagnosis not present

## 2021-12-20 DIAGNOSIS — K760 Fatty (change of) liver, not elsewhere classified: Secondary | ICD-10-CM | POA: Diagnosis not present

## 2021-12-20 DIAGNOSIS — E1159 Type 2 diabetes mellitus with other circulatory complications: Secondary | ICD-10-CM | POA: Diagnosis not present

## 2021-12-20 DIAGNOSIS — E669 Obesity, unspecified: Secondary | ICD-10-CM | POA: Diagnosis not present

## 2022-01-25 DIAGNOSIS — Z23 Encounter for immunization: Secondary | ICD-10-CM | POA: Diagnosis not present

## 2022-02-21 DIAGNOSIS — E785 Hyperlipidemia, unspecified: Secondary | ICD-10-CM | POA: Diagnosis not present

## 2022-02-21 DIAGNOSIS — K7460 Unspecified cirrhosis of liver: Secondary | ICD-10-CM | POA: Diagnosis not present

## 2022-02-21 DIAGNOSIS — Z79899 Other long term (current) drug therapy: Secondary | ICD-10-CM | POA: Diagnosis not present

## 2022-02-21 DIAGNOSIS — Z7984 Long term (current) use of oral hypoglycemic drugs: Secondary | ICD-10-CM | POA: Diagnosis not present

## 2022-02-21 DIAGNOSIS — E119 Type 2 diabetes mellitus without complications: Secondary | ICD-10-CM | POA: Diagnosis not present

## 2022-02-21 DIAGNOSIS — K219 Gastro-esophageal reflux disease without esophagitis: Secondary | ICD-10-CM | POA: Diagnosis not present

## 2022-02-21 DIAGNOSIS — F419 Anxiety disorder, unspecified: Secondary | ICD-10-CM | POA: Diagnosis not present

## 2022-02-21 DIAGNOSIS — I10 Essential (primary) hypertension: Secondary | ICD-10-CM | POA: Diagnosis not present

## 2022-04-19 DIAGNOSIS — K7460 Unspecified cirrhosis of liver: Secondary | ICD-10-CM | POA: Diagnosis not present

## 2022-05-23 DIAGNOSIS — E785 Hyperlipidemia, unspecified: Secondary | ICD-10-CM | POA: Diagnosis not present

## 2022-05-23 DIAGNOSIS — I152 Hypertension secondary to endocrine disorders: Secondary | ICD-10-CM | POA: Diagnosis not present

## 2022-05-23 DIAGNOSIS — K760 Fatty (change of) liver, not elsewhere classified: Secondary | ICD-10-CM | POA: Diagnosis not present

## 2022-05-23 DIAGNOSIS — E1169 Type 2 diabetes mellitus with other specified complication: Secondary | ICD-10-CM | POA: Diagnosis not present

## 2022-05-23 DIAGNOSIS — E1159 Type 2 diabetes mellitus with other circulatory complications: Secondary | ICD-10-CM | POA: Diagnosis not present

## 2022-05-23 DIAGNOSIS — E669 Obesity, unspecified: Secondary | ICD-10-CM | POA: Diagnosis not present

## 2022-05-26 DIAGNOSIS — Z7985 Long-term (current) use of injectable non-insulin antidiabetic drugs: Secondary | ICD-10-CM | POA: Diagnosis not present

## 2022-05-26 DIAGNOSIS — E1159 Type 2 diabetes mellitus with other circulatory complications: Secondary | ICD-10-CM | POA: Diagnosis not present

## 2022-05-26 DIAGNOSIS — Z6836 Body mass index (BMI) 36.0-36.9, adult: Secondary | ICD-10-CM | POA: Diagnosis not present

## 2022-05-26 DIAGNOSIS — E669 Obesity, unspecified: Secondary | ICD-10-CM | POA: Diagnosis not present

## 2022-05-26 DIAGNOSIS — E785 Hyperlipidemia, unspecified: Secondary | ICD-10-CM | POA: Diagnosis not present

## 2022-05-26 DIAGNOSIS — Z7984 Long term (current) use of oral hypoglycemic drugs: Secondary | ICD-10-CM | POA: Diagnosis not present

## 2022-05-26 DIAGNOSIS — E1169 Type 2 diabetes mellitus with other specified complication: Secondary | ICD-10-CM | POA: Diagnosis not present

## 2022-05-26 DIAGNOSIS — K760 Fatty (change of) liver, not elsewhere classified: Secondary | ICD-10-CM | POA: Diagnosis not present

## 2022-05-26 DIAGNOSIS — I152 Hypertension secondary to endocrine disorders: Secondary | ICD-10-CM | POA: Diagnosis not present

## 2022-05-31 DIAGNOSIS — Z23 Encounter for immunization: Secondary | ICD-10-CM | POA: Diagnosis not present

## 2022-06-01 DIAGNOSIS — Z23 Encounter for immunization: Secondary | ICD-10-CM | POA: Diagnosis not present

## 2022-06-15 DIAGNOSIS — I10 Essential (primary) hypertension: Secondary | ICD-10-CM | POA: Diagnosis not present

## 2022-06-15 DIAGNOSIS — K760 Fatty (change of) liver, not elsewhere classified: Secondary | ICD-10-CM | POA: Diagnosis not present

## 2022-06-15 DIAGNOSIS — Z87891 Personal history of nicotine dependence: Secondary | ICD-10-CM | POA: Diagnosis not present

## 2022-06-15 DIAGNOSIS — E118 Type 2 diabetes mellitus with unspecified complications: Secondary | ICD-10-CM | POA: Diagnosis not present

## 2022-06-15 DIAGNOSIS — K7460 Unspecified cirrhosis of liver: Secondary | ICD-10-CM | POA: Diagnosis not present

## 2022-06-15 DIAGNOSIS — I8500 Esophageal varices without bleeding: Secondary | ICD-10-CM | POA: Diagnosis not present

## 2022-06-15 DIAGNOSIS — K3189 Other diseases of stomach and duodenum: Secondary | ICD-10-CM | POA: Diagnosis not present

## 2022-06-15 DIAGNOSIS — K2270 Barrett's esophagus without dysplasia: Secondary | ICD-10-CM | POA: Diagnosis not present

## 2022-06-15 DIAGNOSIS — Z7984 Long term (current) use of oral hypoglycemic drugs: Secondary | ICD-10-CM | POA: Diagnosis not present

## 2022-06-15 DIAGNOSIS — K766 Portal hypertension: Secondary | ICD-10-CM | POA: Diagnosis not present

## 2022-06-15 DIAGNOSIS — I8510 Secondary esophageal varices without bleeding: Secondary | ICD-10-CM | POA: Diagnosis not present

## 2022-08-23 DIAGNOSIS — E119 Type 2 diabetes mellitus without complications: Secondary | ICD-10-CM | POA: Diagnosis not present

## 2022-09-27 DIAGNOSIS — E669 Obesity, unspecified: Secondary | ICD-10-CM | POA: Diagnosis not present

## 2022-09-27 DIAGNOSIS — Z6836 Body mass index (BMI) 36.0-36.9, adult: Secondary | ICD-10-CM | POA: Diagnosis not present

## 2022-09-27 DIAGNOSIS — K760 Fatty (change of) liver, not elsewhere classified: Secondary | ICD-10-CM | POA: Diagnosis not present

## 2022-09-27 DIAGNOSIS — E785 Hyperlipidemia, unspecified: Secondary | ICD-10-CM | POA: Diagnosis not present

## 2022-09-27 DIAGNOSIS — E1169 Type 2 diabetes mellitus with other specified complication: Secondary | ICD-10-CM | POA: Diagnosis not present

## 2022-09-27 DIAGNOSIS — E1159 Type 2 diabetes mellitus with other circulatory complications: Secondary | ICD-10-CM | POA: Diagnosis not present

## 2022-09-27 DIAGNOSIS — E118 Type 2 diabetes mellitus with unspecified complications: Secondary | ICD-10-CM | POA: Diagnosis not present

## 2022-09-27 DIAGNOSIS — I152 Hypertension secondary to endocrine disorders: Secondary | ICD-10-CM | POA: Diagnosis not present

## 2022-09-29 DIAGNOSIS — E785 Hyperlipidemia, unspecified: Secondary | ICD-10-CM | POA: Diagnosis not present

## 2022-09-29 DIAGNOSIS — Z6837 Body mass index (BMI) 37.0-37.9, adult: Secondary | ICD-10-CM | POA: Diagnosis not present

## 2022-09-29 DIAGNOSIS — Z7985 Long-term (current) use of injectable non-insulin antidiabetic drugs: Secondary | ICD-10-CM | POA: Diagnosis not present

## 2022-09-29 DIAGNOSIS — E1159 Type 2 diabetes mellitus with other circulatory complications: Secondary | ICD-10-CM | POA: Diagnosis not present

## 2022-09-29 DIAGNOSIS — E1169 Type 2 diabetes mellitus with other specified complication: Secondary | ICD-10-CM | POA: Diagnosis not present

## 2022-09-29 DIAGNOSIS — E1142 Type 2 diabetes mellitus with diabetic polyneuropathy: Secondary | ICD-10-CM | POA: Diagnosis not present

## 2022-09-29 DIAGNOSIS — K760 Fatty (change of) liver, not elsewhere classified: Secondary | ICD-10-CM | POA: Diagnosis not present

## 2022-09-29 DIAGNOSIS — I152 Hypertension secondary to endocrine disorders: Secondary | ICD-10-CM | POA: Diagnosis not present

## 2022-09-29 DIAGNOSIS — Z7984 Long term (current) use of oral hypoglycemic drugs: Secondary | ICD-10-CM | POA: Diagnosis not present

## 2022-10-02 DIAGNOSIS — Z6837 Body mass index (BMI) 37.0-37.9, adult: Secondary | ICD-10-CM | POA: Insufficient documentation

## 2022-10-03 DIAGNOSIS — K7460 Unspecified cirrhosis of liver: Secondary | ICD-10-CM | POA: Insufficient documentation

## 2022-10-09 ENCOUNTER — Ambulatory Visit (INDEPENDENT_AMBULATORY_CARE_PROVIDER_SITE_OTHER): Payer: Medicare Other | Admitting: Podiatry

## 2022-10-09 ENCOUNTER — Ambulatory Visit (INDEPENDENT_AMBULATORY_CARE_PROVIDER_SITE_OTHER): Payer: Medicare Other

## 2022-10-09 VITALS — BP 144/81 | HR 88 | Temp 98.0°F

## 2022-10-09 DIAGNOSIS — M2042 Other hammer toe(s) (acquired), left foot: Secondary | ICD-10-CM

## 2022-10-09 DIAGNOSIS — E1149 Type 2 diabetes mellitus with other diabetic neurological complication: Secondary | ICD-10-CM | POA: Diagnosis not present

## 2022-10-09 DIAGNOSIS — E1140 Type 2 diabetes mellitus with diabetic neuropathy, unspecified: Secondary | ICD-10-CM

## 2022-10-09 DIAGNOSIS — M216X9 Other acquired deformities of unspecified foot: Secondary | ICD-10-CM

## 2022-10-09 NOTE — Progress Notes (Signed)
Patient presents to the office today for diabetic shoe and insole measuring.  Patient was measured with brannock device to determine size and width for 1 pair of extra depth shoes and foam casted for 3 pair of insoles.   ABN signed.   Documentation of medical necessity will be sent to patient's treating diabetic doctor to verify and sign.   Patient's diabetic provider: Lanney Gins  [id number]   Shoes and insoles will be ordered at that time and patient will be notified for an appointment for fitting when they arrive.   Brannock measurement: 15 W  Patient shoe selection-   1st   Shoe choice:   [id number] NEW BALANCE  Shoe size ordered: 9 W

## 2022-10-09 NOTE — Progress Notes (Signed)
Subjective:   Patient ID: Joe [name], male   DOB: 72 y.o.   MRN: [id number]   HPI Patient presents stating that he does have diabetes tries to keep it under good control with moderate obesity and does need diabetic shoes he said digital deformities and has nail disease that he is concerned about.  Patient does not smoke tries to be active   Review of Systems  All other systems reviewed and are negative.       Objective:  Physical Exam Vitals and nursing note reviewed.  Constitutional:      Appearance: He is well-developed.  Pulmonary:     Effort: Pulmonary effort is normal.  Musculoskeletal:        General: Normal range of motion.  Skin:    General: Skin is warm.  Neurological:     Mental Status: He is alert.     Vascular status was found to be intact neurological mild diminishment sharp dull vibratory with patient noted to have moderate cavus foot structure bilateral.  Pronation was digital deformity fifth digit left over right rotation to regrow distal lateral irritation slight redness noted     Assessment:  Long-term diabetic with digital deformity and a preulcerative area of redness with moderate neurological deficit     Plan:  H&P diabetic education rendered discussed daily inspections of his feet and at this point went ahead and we will get him casted for diabetic shoes to get approval for the use to try to prevent him from future problems.  Educated him on this all questions answered today

## 2022-11-15 ENCOUNTER — Ambulatory Visit: Payer: Medicare Other | Admitting: Podiatry

## 2022-11-15 DIAGNOSIS — M2042 Other hammer toe(s) (acquired), left foot: Secondary | ICD-10-CM | POA: Diagnosis not present

## 2022-11-15 DIAGNOSIS — E1140 Type 2 diabetes mellitus with diabetic neuropathy, unspecified: Secondary | ICD-10-CM

## 2022-11-15 DIAGNOSIS — M216X9 Other acquired deformities of unspecified foot: Secondary | ICD-10-CM

## 2022-11-15 DIAGNOSIS — M216X1 Other acquired deformities of right foot: Secondary | ICD-10-CM | POA: Diagnosis not present

## 2022-11-27 DIAGNOSIS — I10 Essential (primary) hypertension: Secondary | ICD-10-CM | POA: Diagnosis not present

## 2022-11-27 DIAGNOSIS — M2550 Pain in unspecified joint: Secondary | ICD-10-CM | POA: Diagnosis not present

## 2022-11-27 DIAGNOSIS — Z0000 Encounter for general adult medical examination without abnormal findings: Secondary | ICD-10-CM | POA: Diagnosis not present

## 2022-11-27 DIAGNOSIS — F419 Anxiety disorder, unspecified: Secondary | ICD-10-CM | POA: Diagnosis not present

## 2022-11-27 DIAGNOSIS — K219 Gastro-esophageal reflux disease without esophagitis: Secondary | ICD-10-CM | POA: Diagnosis not present

## 2023-01-31 DIAGNOSIS — E1169 Type 2 diabetes mellitus with other specified complication: Secondary | ICD-10-CM | POA: Diagnosis not present

## 2023-01-31 DIAGNOSIS — E785 Hyperlipidemia, unspecified: Secondary | ICD-10-CM | POA: Diagnosis not present

## 2023-01-31 DIAGNOSIS — E118 Type 2 diabetes mellitus with unspecified complications: Secondary | ICD-10-CM | POA: Diagnosis not present

## 2023-01-31 DIAGNOSIS — E1159 Type 2 diabetes mellitus with other circulatory complications: Secondary | ICD-10-CM | POA: Diagnosis not present

## 2023-01-31 DIAGNOSIS — K760 Fatty (change of) liver, not elsewhere classified: Secondary | ICD-10-CM | POA: Diagnosis not present

## 2023-01-31 DIAGNOSIS — E669 Obesity, unspecified: Secondary | ICD-10-CM | POA: Diagnosis not present

## 2023-01-31 DIAGNOSIS — I152 Hypertension secondary to endocrine disorders: Secondary | ICD-10-CM | POA: Diagnosis not present

## 2023-01-31 DIAGNOSIS — E1165 Type 2 diabetes mellitus with hyperglycemia: Secondary | ICD-10-CM | POA: Diagnosis not present

## 2023-01-31 DIAGNOSIS — E1142 Type 2 diabetes mellitus with diabetic polyneuropathy: Secondary | ICD-10-CM | POA: Diagnosis not present

## 2023-02-02 DIAGNOSIS — E669 Obesity, unspecified: Secondary | ICD-10-CM | POA: Diagnosis not present

## 2023-02-02 DIAGNOSIS — E1159 Type 2 diabetes mellitus with other circulatory complications: Secondary | ICD-10-CM | POA: Diagnosis not present

## 2023-02-02 DIAGNOSIS — E1169 Type 2 diabetes mellitus with other specified complication: Secondary | ICD-10-CM | POA: Diagnosis not present

## 2023-02-02 DIAGNOSIS — K7581 Nonalcoholic steatohepatitis (NASH): Secondary | ICD-10-CM | POA: Diagnosis not present

## 2023-02-02 DIAGNOSIS — K7460 Unspecified cirrhosis of liver: Secondary | ICD-10-CM | POA: Diagnosis not present

## 2023-02-02 DIAGNOSIS — I152 Hypertension secondary to endocrine disorders: Secondary | ICD-10-CM | POA: Diagnosis not present

## 2023-02-02 DIAGNOSIS — E785 Hyperlipidemia, unspecified: Secondary | ICD-10-CM | POA: Diagnosis not present

## 2023-02-02 DIAGNOSIS — E1165 Type 2 diabetes mellitus with hyperglycemia: Secondary | ICD-10-CM | POA: Diagnosis not present

## 2023-02-02 DIAGNOSIS — E118 Type 2 diabetes mellitus with unspecified complications: Secondary | ICD-10-CM | POA: Diagnosis not present

## 2023-02-02 DIAGNOSIS — E1142 Type 2 diabetes mellitus with diabetic polyneuropathy: Secondary | ICD-10-CM | POA: Diagnosis not present

## 2023-02-02 DIAGNOSIS — Z6835 Body mass index (BMI) 35.0-35.9, adult: Secondary | ICD-10-CM | POA: Diagnosis not present

## 2023-02-04 DIAGNOSIS — E1142 Type 2 diabetes mellitus with diabetic polyneuropathy: Secondary | ICD-10-CM | POA: Insufficient documentation

## 2023-04-13 DIAGNOSIS — N529 Male erectile dysfunction, unspecified: Secondary | ICD-10-CM | POA: Diagnosis not present

## 2023-04-24 NOTE — Progress Notes (Signed)
Patient picked up diabetic shoes  Seen by casting, chart has documented instructions patient signed regarding break-in

## 2023-04-27 IMAGING — US US LIVER ELASTOGRAPHY
1 series · 12 of 25 positions shown · non-contrast
Comparison: None.

CLINICAL DATA: Steatosis

EXAM:
US LIVER ELASTOGRAPHY
TECHNIQUE: Sonography of the liver was performed. In addition, ultrasound
elastography evaluation of the liver was performed. A region of
interest was placed within the right lobe of the liver. Following
application of a compressive sonographic pulse, tissue
compressibility was assessed. Multiple assessments were performed at
the selected site. Median tissue compressibility was determined.
Previously, hepatic stiffness was assessed by shear wave velocity.
Based on recently published Society of Radiologists in Ultrasound
consensus article, reporting is now recommended to be performed in
the SI units of pressure (kiloPascals) representing hepatic
stiffness/elasticity. The obtained result is compared to the
published reference standards. (cACLD = compensated Advanced Chronic
Liver Disease)

[Series 1: us liver elastography · 0.22mm/px · 12 of 34 slices shown]
[im 2/34]
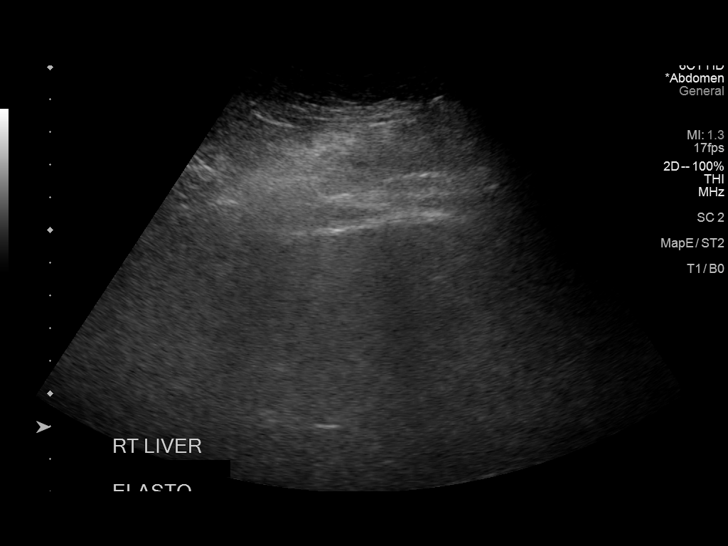
[im 5/34]
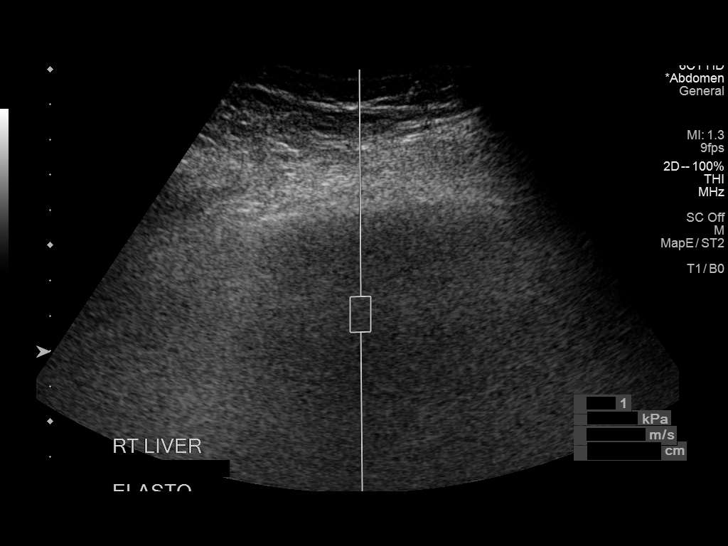
[im 7/34]
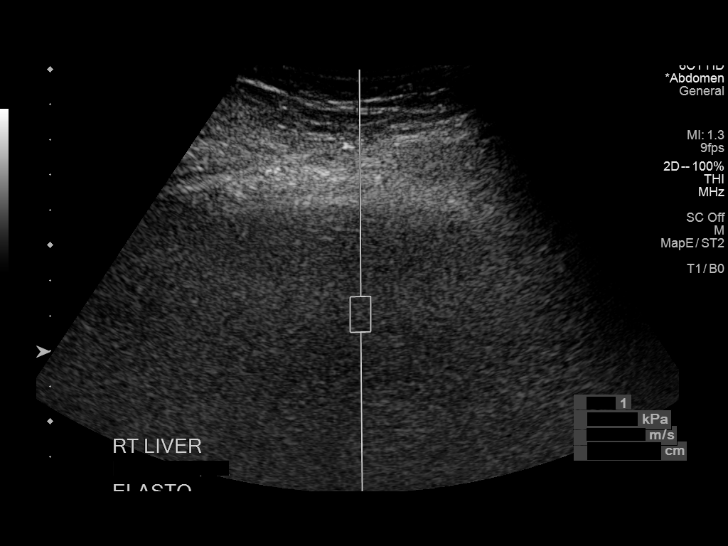
[im 10/34]
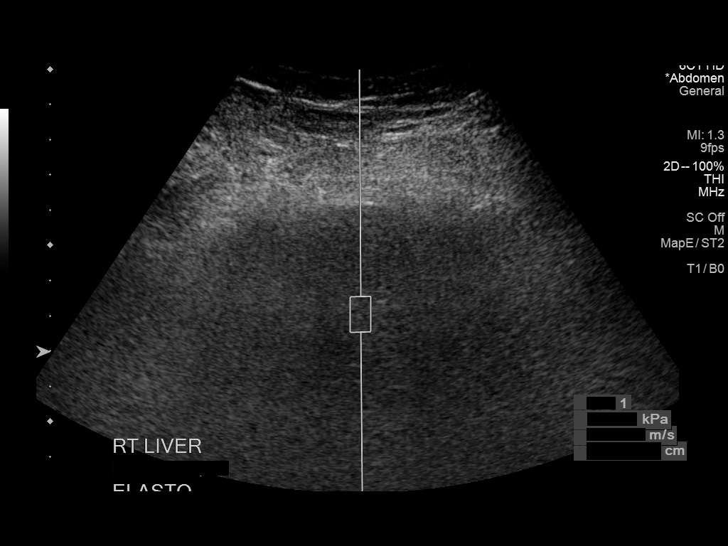
[im 13/34]
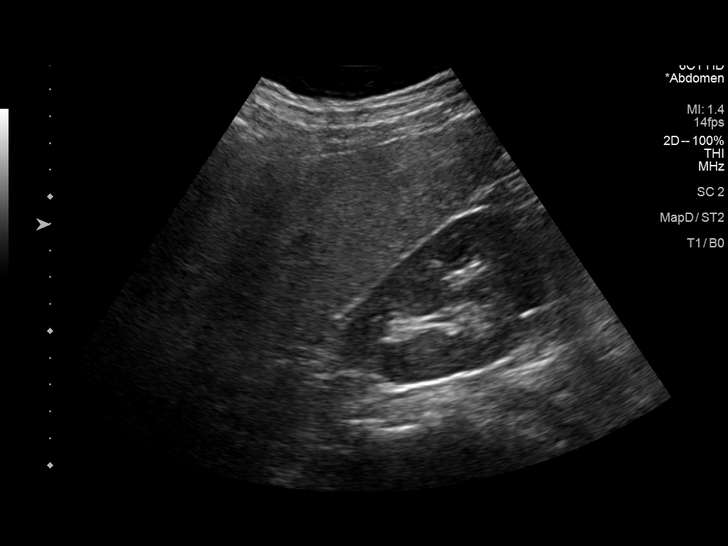
[im 16/34]
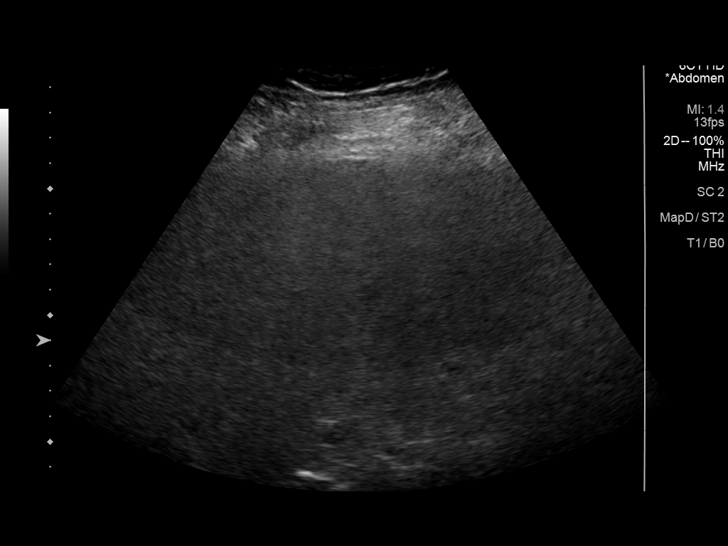
[im 18/34]
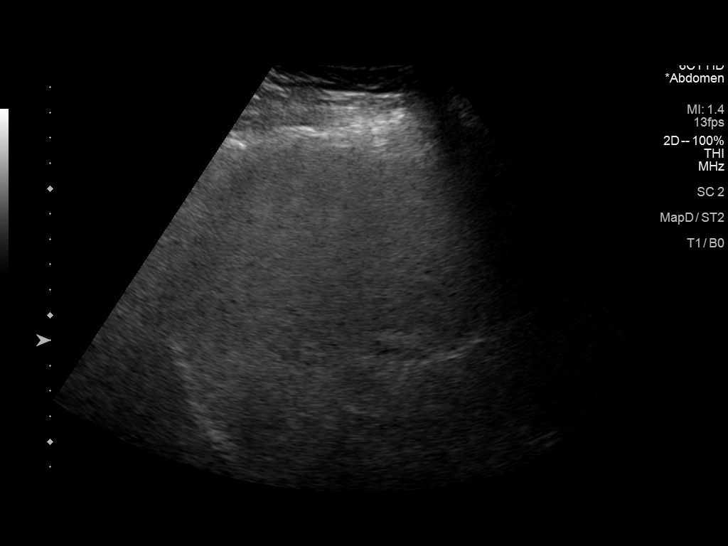
[im 21/34]
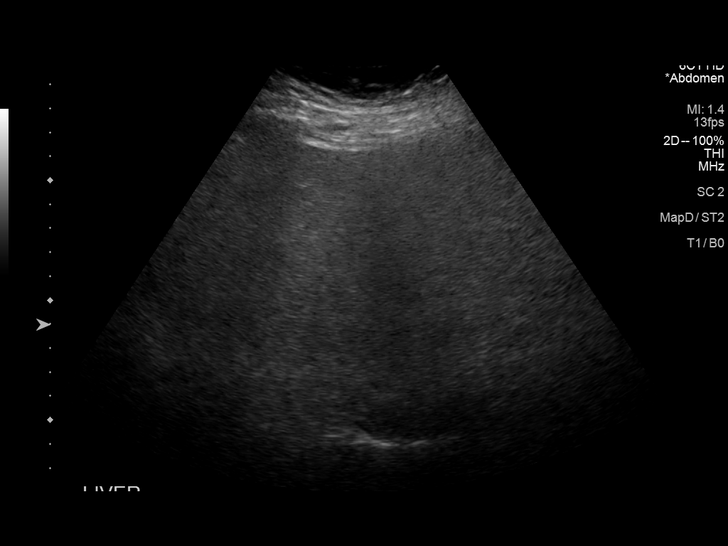
[im 24/34]
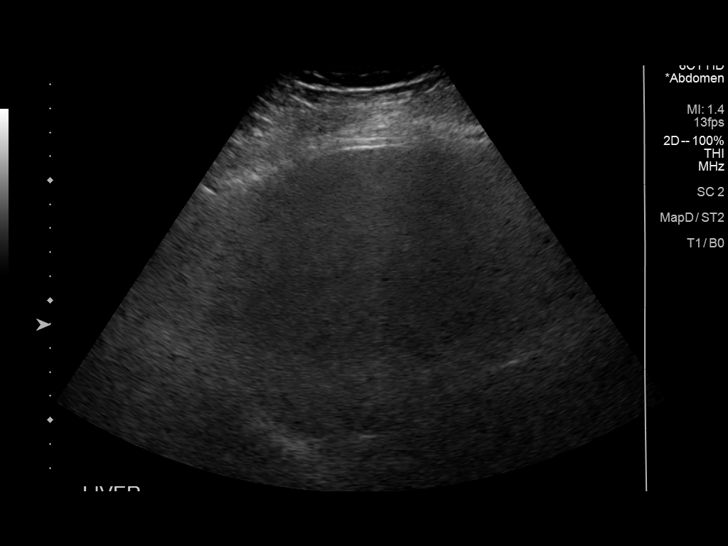
[im 27/34]
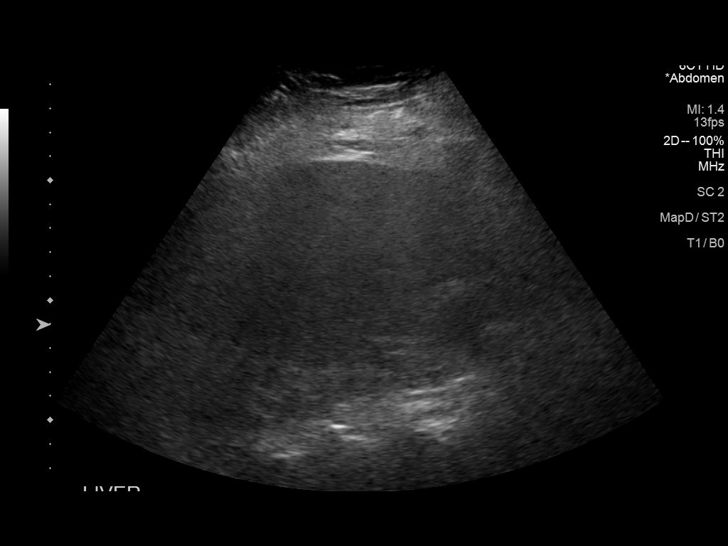
[im 29/34]
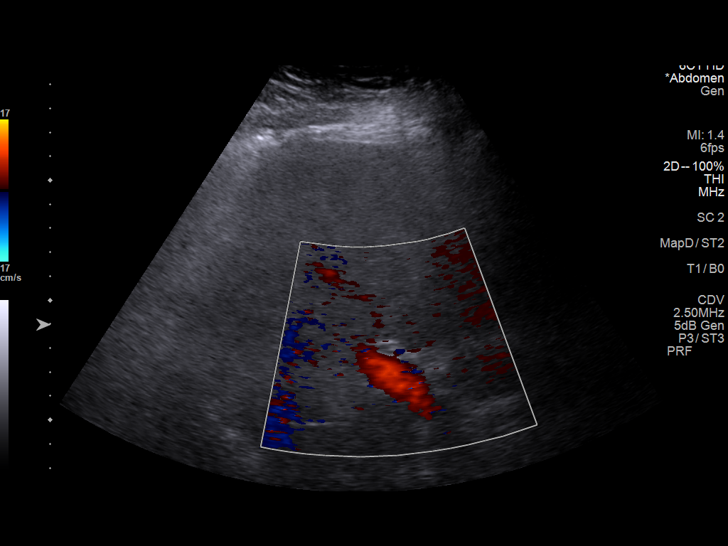
[im 32/34]
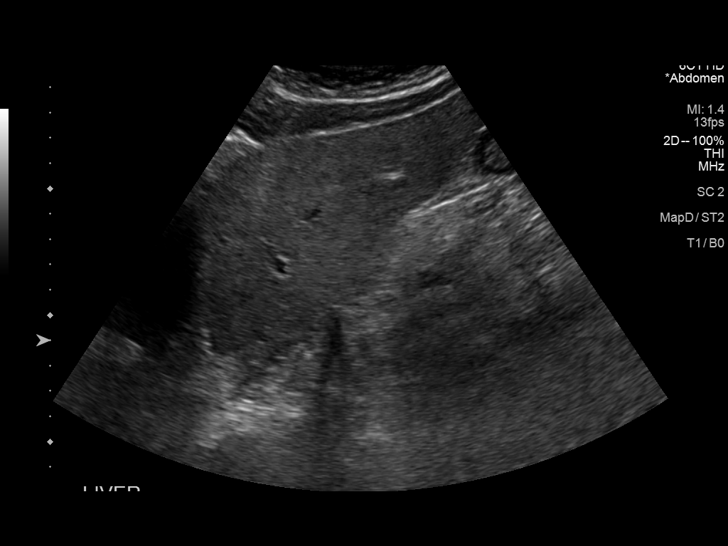

[12 of 25 positions shown; findings below may reference images not displayed]

FINDINGS: Liver: No focal lesion identified. Diffusely increased parenchymal
echogenicity with coarsened echotexture and decreased acoustic
penetration. Portal vein is patent on color Doppler imaging with
normal direction of blood flow towards the liver.

ULTRASOUND HEPATIC ELASTOGRAPHY

Device: Siemens Helix VTQ

Patient position: Oblique

Transducer: [id number]

Number of measurements: 10

Hepatic segment:  8

Median kPa:

IQR:

IQR/Median kPa ratio:

Data quality: IQR/Median kPa ratio of 0.3 or greater indicates
reduced accuracy

Diagnostic category: > or =17 kPa: highly suggestive of cACLD with
an increased probability of clinically significant portal
hypertension

The use of hepatic elastography is applicable to patients with viral
hepatitis and non-alcoholic fatty liver disease. At this time, there
is insufficient data for the referenced cut-off values and use in
other causes of liver disease, including alcoholic liver disease.
Patients, however, may be assessed by elastography and serve as
their own reference standard/baseline.

In patients with non-alcoholic liver disease, the values suggesting
compensated advanced chronic liver disease (cACLD) may be lower, and
patients may need additional testing with elasticity results of [DATE]
kPa.

Please note that abnormal hepatic elasticity and shear wave
velocities may also be identified in clinical settings other than
with hepatic fibrosis, such as: acute hepatitis, elevated right
heart and central venous pressures including use of beta blockers,
Julienne disease ([place]), infiltrative processes such as
mastocytosis/amyloidosis/infiltrative tumor/lymphoma, extrahepatic
cholestasis, with hyperemia in the post-prandial state, and with
liver transplantation. Correlation with patient history, laboratory
data, and clinical condition recommended.

Diagnostic Categories:

< or =5 kPa: high probability of being normal

< or =9 kPa: in the absence of other known clinical signs, rules [DATE] kPa and ?13 kPa: suggestive of cACLD, but needs further testing

>13 kPa: highly suggestive of cACLD

> or =17 kPa: highly suggestive of cACLD with an increased
probability of clinically significant portal hypertension
IMPRESSION: ULTRASOUND LIVER:

Increased parenchymal echogenicity with coarsened hepatic
echotexture, nonspecific but likely reflecting hepatic steatosis
with cirrhosis. No discrete hepatic lesion identified.

ULTRASOUND HEPATIC ELASTOGRAPHY:

Median kPa:

Diagnostic category: > or =17 kPa: highly suggestive of cACLD with
an increased probability of clinically significant portal
hypertension

## 2023-05-04 DIAGNOSIS — E1159 Type 2 diabetes mellitus with other circulatory complications: Secondary | ICD-10-CM | POA: Diagnosis not present

## 2023-05-04 DIAGNOSIS — E118 Type 2 diabetes mellitus with unspecified complications: Secondary | ICD-10-CM | POA: Diagnosis not present

## 2023-05-04 DIAGNOSIS — E1165 Type 2 diabetes mellitus with hyperglycemia: Secondary | ICD-10-CM | POA: Diagnosis not present

## 2023-05-04 DIAGNOSIS — E1142 Type 2 diabetes mellitus with diabetic polyneuropathy: Secondary | ICD-10-CM | POA: Diagnosis not present

## 2023-05-04 DIAGNOSIS — E1169 Type 2 diabetes mellitus with other specified complication: Secondary | ICD-10-CM | POA: Diagnosis not present

## 2023-05-04 DIAGNOSIS — K7460 Unspecified cirrhosis of liver: Secondary | ICD-10-CM | POA: Diagnosis not present

## 2023-05-04 DIAGNOSIS — I152 Hypertension secondary to endocrine disorders: Secondary | ICD-10-CM | POA: Diagnosis not present

## 2023-05-04 DIAGNOSIS — E785 Hyperlipidemia, unspecified: Secondary | ICD-10-CM | POA: Diagnosis not present

## 2023-05-04 DIAGNOSIS — E669 Obesity, unspecified: Secondary | ICD-10-CM | POA: Diagnosis not present

## 2023-05-04 DIAGNOSIS — K7581 Nonalcoholic steatohepatitis (NASH): Secondary | ICD-10-CM | POA: Diagnosis not present

## 2023-05-08 DIAGNOSIS — Z7984 Long term (current) use of oral hypoglycemic drugs: Secondary | ICD-10-CM | POA: Diagnosis not present

## 2023-05-08 DIAGNOSIS — E118 Type 2 diabetes mellitus with unspecified complications: Secondary | ICD-10-CM | POA: Diagnosis not present

## 2023-05-08 DIAGNOSIS — E1142 Type 2 diabetes mellitus with diabetic polyneuropathy: Secondary | ICD-10-CM | POA: Diagnosis not present

## 2023-05-08 DIAGNOSIS — E785 Hyperlipidemia, unspecified: Secondary | ICD-10-CM | POA: Diagnosis not present

## 2023-05-08 DIAGNOSIS — E669 Obesity, unspecified: Secondary | ICD-10-CM | POA: Diagnosis not present

## 2023-05-08 DIAGNOSIS — K7581 Nonalcoholic steatohepatitis (NASH): Secondary | ICD-10-CM | POA: Diagnosis not present

## 2023-05-08 DIAGNOSIS — Z6832 Body mass index (BMI) 32.0-32.9, adult: Secondary | ICD-10-CM | POA: Diagnosis not present

## 2023-05-08 DIAGNOSIS — Z7985 Long-term (current) use of injectable non-insulin antidiabetic drugs: Secondary | ICD-10-CM | POA: Diagnosis not present

## 2023-05-08 DIAGNOSIS — E1169 Type 2 diabetes mellitus with other specified complication: Secondary | ICD-10-CM | POA: Diagnosis not present

## 2023-05-08 DIAGNOSIS — I152 Hypertension secondary to endocrine disorders: Secondary | ICD-10-CM | POA: Diagnosis not present

## 2023-05-08 DIAGNOSIS — E1159 Type 2 diabetes mellitus with other circulatory complications: Secondary | ICD-10-CM | POA: Diagnosis not present

## 2023-06-15 ENCOUNTER — Encounter (HOSPITAL_BASED_OUTPATIENT_CLINIC_OR_DEPARTMENT_OTHER): Payer: Self-pay | Admitting: Emergency Medicine

## 2023-06-15 ENCOUNTER — Emergency Department (HOSPITAL_BASED_OUTPATIENT_CLINIC_OR_DEPARTMENT_OTHER): Payer: Medicare Other | Admitting: Radiology

## 2023-06-15 ENCOUNTER — Emergency Department (HOSPITAL_BASED_OUTPATIENT_CLINIC_OR_DEPARTMENT_OTHER)
Admission: EM | Admit: 2023-06-15 | Discharge: 2023-06-16 | Disposition: A | Discharge status: Home / Self Care | Payer: Medicare Other | Attending: Emergency Medicine | Admitting: Emergency Medicine

## 2023-06-15 DIAGNOSIS — Z7984 Long term (current) use of oral hypoglycemic drugs: Secondary | ICD-10-CM | POA: Insufficient documentation

## 2023-06-15 DIAGNOSIS — S61412A Laceration without foreign body of left hand, initial encounter: Secondary | ICD-10-CM | POA: Insufficient documentation

## 2023-06-15 DIAGNOSIS — E119 Type 2 diabetes mellitus without complications: Secondary | ICD-10-CM | POA: Diagnosis not present

## 2023-06-15 DIAGNOSIS — S6992XA Unspecified injury of left wrist, hand and finger(s), initial encounter: Secondary | ICD-10-CM | POA: Diagnosis not present

## 2023-06-15 DIAGNOSIS — S61012A Laceration without foreign body of left thumb without damage to nail, initial encounter: Secondary | ICD-10-CM | POA: Diagnosis not present

## 2023-06-15 DIAGNOSIS — W010XXA Fall on same level from slipping, tripping and stumbling without subsequent striking against object, initial encounter: Secondary | ICD-10-CM | POA: Diagnosis not present

## 2023-06-15 MED ORDER — LIDOCAINE-EPINEPHRINE (PF) 2 %-1:200000 IJ SOLN
10.0000 mL | Freq: Once | INTRAMUSCULAR | Status: AC
  Administered 2023-06-15: 10 mL via INTRADERMAL
  Filled 2023-06-15: qty 20

## 2023-06-15 NOTE — Discharge Instructions (Addendum)
You have 6 stitches on your left arm which need to be removed by a doctor in about 7 days.  You should keep the area clean and dry and change the dressing daily.  If you develop bleeding, worsening redness, severe swelling, severe pain or fever you should return to the ED.

## 2023-06-15 NOTE — ED Provider Notes (Signed)
[place] EMERGENCY DEPARTMENT AT [facility] Provider Note   CSN: [id number] Arrival date & time: 06/15/23  1909     History {Add pertinent medical, surgical, social history, OB history to HPI:1} Chief Complaint  Patient presents with   Fall   Hand Injury    Joe [name] is a 72 y.o. male.   Fall  Hand Injury 72 year old male history of diabetes presenting for fall.  Patient was walking today when he tripped and fell.  His left hand caught on a [employer].  He has 2 small lacerations over the volar aspect of the left thumb.  No numbness or tingling.  Not on anticoagulation per did not hit his head.  No headache or neck pain or chest pain or back pain or abdominal pain.  No dizziness prior to falling.  Last tetanus was about a year ago.     Home Medications Prior to Admission medications   Medication Sig Start Date End Date Taking? Authorizing Provider  BYDUREON 2 MG PEN  12/27/15   [provider]  diazepam (VALIUM) 2 MG tablet  12/13/15   [provider]  esomeprazole (NEXIUM) 40 MG capsule  11/26/15   [provider]  losartan (COZAAR) 100 MG tablet  12/28/15   [provider]  metFORMIN (GLUCOPHAGE-XR) 500 MG 24 hr tablet  11/26/15   [provider]  [facility] VERIO test strip  12/26/15   [provider]  rosuvastatin (CRESTOR) 10 MG tablet  02/01/16   [provider]      Allergies    Aspirin and Ibuprofen    Review of Systems   Review of Systems Review of systems completed and notable as per HPI.  ROS otherwise negative.   Physical Exam Updated Vital Signs BP (!) 136/96 (BP Location: Right Arm)   Pulse 60   Temp 98 F (36.7 C)   Resp 16   SpO2 95%  Physical Exam Vitals and nursing note reviewed.  Constitutional:      General: He is not in acute distress.    Appearance: He is well-developed.  HENT:     Head: Normocephalic and atraumatic.  Eyes:     Conjunctiva/sclera: Conjunctivae  normal.  Cardiovascular:     Rate and Rhythm: Normal rate and regular rhythm.     Pulses: Normal pulses.     Heart sounds: Normal heart sounds. No murmur heard. Pulmonary:     Effort: Pulmonary effort is normal. No respiratory distress.     Breath sounds: Normal breath sounds.  Abdominal:     Palpations: Abdomen is soft.     Tenderness: There is no abdominal tenderness.  Musculoskeletal:        General: No swelling.     Cervical back: Normal range of motion and neck supple. No rigidity or tenderness.     Comments: 2 small superficial lacerations over the volar aspect of the left thumb over the proximal distal phalanx.  Hemostatic.  Small skin flap.  No exposed bone.  Full pain with flexion and extension at MCP, PIP, DIP of all fingers.  Normal capillary refill.  2+ radial pulse.  Normal strength and sensation throughout.  Skin:    General: Skin is warm and dry.     Capillary Refill: Capillary refill takes less than 2 seconds.  Neurological:     General: No focal deficit present.     Mental Status: He is alert and oriented to person, place, and time. Mental status is at baseline.  Psychiatric:        Mood and Affect: Mood normal.     ED Results / Procedures / Treatments   Labs (all labs ordered are listed, but only abnormal results are displayed) Labs Reviewed - No data to display  EKG None  Radiology No results found.  Procedures Procedures  {Document cardiac monitor, telemetry assessment procedure when appropriate:1}  Medications Ordered in ED Medications - No data to display  ED Course/ Medical Decision Making/ A&P   {   Click here for ABCD2, HEART and other calculatorsREFRESH Note before signing :1}                              Medical Decision Making Amount and/or Complexity of Data Reviewed Radiology: ordered.   Medical Decision Making:   Joe [name] is a 72 y.o. male who presented to the ED today with laceration of left hand after mechanical fall.  Vital  signs reviewed.  No other signs of trauma.  2 small lacerations, no exposed bone or obvious foreign body.  Wound washed out.  Obtain x-ray to rule out fracture.  Tetanus is up-to-date.   {crccomplexity:27900} Reviewed and confirmed nursing documentation for past medical history, family history, social history.  Initial Study Results:   Laboratory  All laboratory results reviewed.  Labs notable for ***  ***EKG EKG was reviewed independently. Rate, rhythm, axis, intervals all examined and without medically relevant abnormality. ST segments without concerns for elevations.    Radiology:  All images reviewed independently. ***Agree with radiology report at this time.      Consults: Case discussed with ***.   Reassessment and Plan:   ***    Patient's presentation is most consistent with {EM COPA:27473}     {Document critical care time when appropriate:1} {Document review of labs and clinical decision tools ie heart score, Chads2Vasc2 etc:1}  {Document your independent review of radiology images, and any outside records:1} {Document your discussion with family members, caretakers, and with consultants:1} {Document social determinants of health affecting pt's care:1} {Document your decision making why or why not admission, treatments were needed:1} Final Clinical Impression(s) / ED Diagnoses Final diagnoses:  None    Rx / DC Orders ED Discharge Orders     None

## 2023-06-15 NOTE — ED Triage Notes (Signed)
Trip fall hit left hand/ thumb on trailer hitch Happened around 5pm

## 2023-06-22 DIAGNOSIS — S61012D Laceration without foreign body of left thumb without damage to nail, subsequent encounter: Secondary | ICD-10-CM | POA: Diagnosis not present

## 2023-06-27 DIAGNOSIS — S61112D Laceration without foreign body of left thumb with damage to nail, subsequent encounter: Secondary | ICD-10-CM | POA: Diagnosis not present

## 2023-06-27 DIAGNOSIS — Z4889 Encounter for other specified surgical aftercare: Secondary | ICD-10-CM | POA: Diagnosis not present

## 2023-10-23 DIAGNOSIS — K7460 Unspecified cirrhosis of liver: Secondary | ICD-10-CM | POA: Diagnosis not present

## 2023-10-23 DIAGNOSIS — K7581 Nonalcoholic steatohepatitis (NASH): Secondary | ICD-10-CM | POA: Diagnosis not present

## 2023-10-30 DIAGNOSIS — K7460 Unspecified cirrhosis of liver: Secondary | ICD-10-CM | POA: Diagnosis not present

## 2023-10-30 DIAGNOSIS — K7581 Nonalcoholic steatohepatitis (NASH): Secondary | ICD-10-CM | POA: Diagnosis not present

## 2023-10-30 DIAGNOSIS — Z1289 Encounter for screening for malignant neoplasm of other sites: Secondary | ICD-10-CM | POA: Diagnosis not present

## 2023-11-13 DIAGNOSIS — D225 Melanocytic nevi of trunk: Secondary | ICD-10-CM | POA: Diagnosis not present

## 2023-11-13 DIAGNOSIS — L578 Other skin changes due to chronic exposure to nonionizing radiation: Secondary | ICD-10-CM | POA: Diagnosis not present

## 2023-11-13 DIAGNOSIS — L820 Inflamed seborrheic keratosis: Secondary | ICD-10-CM | POA: Diagnosis not present

## 2023-11-13 DIAGNOSIS — L821 Other seborrheic keratosis: Secondary | ICD-10-CM | POA: Diagnosis not present

## 2023-11-13 DIAGNOSIS — D485 Neoplasm of uncertain behavior of skin: Secondary | ICD-10-CM | POA: Diagnosis not present

## 2023-11-13 DIAGNOSIS — D044 Carcinoma in situ of skin of scalp and neck: Secondary | ICD-10-CM | POA: Diagnosis not present

## 2023-11-27 DIAGNOSIS — E119 Type 2 diabetes mellitus without complications: Secondary | ICD-10-CM | POA: Diagnosis not present

## 2023-12-04 DIAGNOSIS — C4442 Squamous cell carcinoma of skin of scalp and neck: Secondary | ICD-10-CM | POA: Diagnosis not present

## 2023-12-06 DIAGNOSIS — Z13220 Encounter for screening for lipoid disorders: Secondary | ICD-10-CM | POA: Diagnosis not present

## 2023-12-06 DIAGNOSIS — E1169 Type 2 diabetes mellitus with other specified complication: Secondary | ICD-10-CM | POA: Diagnosis not present

## 2023-12-06 DIAGNOSIS — Z136 Encounter for screening for cardiovascular disorders: Secondary | ICD-10-CM | POA: Diagnosis not present

## 2023-12-10 DIAGNOSIS — K219 Gastro-esophageal reflux disease without esophagitis: Secondary | ICD-10-CM | POA: Diagnosis not present

## 2023-12-10 DIAGNOSIS — Z136 Encounter for screening for cardiovascular disorders: Secondary | ICD-10-CM | POA: Diagnosis not present

## 2023-12-10 DIAGNOSIS — Z23 Encounter for immunization: Secondary | ICD-10-CM | POA: Diagnosis not present

## 2023-12-10 DIAGNOSIS — N529 Male erectile dysfunction, unspecified: Secondary | ICD-10-CM | POA: Diagnosis not present

## 2023-12-10 DIAGNOSIS — K7581 Nonalcoholic steatohepatitis (NASH): Secondary | ICD-10-CM | POA: Diagnosis not present

## 2023-12-10 DIAGNOSIS — E785 Hyperlipidemia, unspecified: Secondary | ICD-10-CM | POA: Diagnosis not present

## 2023-12-10 DIAGNOSIS — I10 Essential (primary) hypertension: Secondary | ICD-10-CM | POA: Diagnosis not present

## 2023-12-10 DIAGNOSIS — Z0000 Encounter for general adult medical examination without abnormal findings: Secondary | ICD-10-CM | POA: Diagnosis not present

## 2023-12-10 DIAGNOSIS — E1169 Type 2 diabetes mellitus with other specified complication: Secondary | ICD-10-CM | POA: Diagnosis not present

## 2023-12-10 DIAGNOSIS — F419 Anxiety disorder, unspecified: Secondary | ICD-10-CM | POA: Diagnosis not present

## 2023-12-10 DIAGNOSIS — Z13220 Encounter for screening for lipoid disorders: Secondary | ICD-10-CM | POA: Diagnosis not present

## 2023-12-20 ENCOUNTER — Telehealth: Payer: Self-pay | Admitting: Podiatry

## 2023-12-20 NOTE — Telephone Encounter (Signed)
 Called patient to cancel shoe appointment. He has not been seen by us  since 11/15/22. Made an appt for him to see one of our doctors first so can get the referral over to [place].

## 2023-12-26 ENCOUNTER — Other Ambulatory Visit

## 2023-12-27 ENCOUNTER — Encounter: Payer: Self-pay | Admitting: Podiatry

## 2023-12-27 ENCOUNTER — Ambulatory Visit (INDEPENDENT_AMBULATORY_CARE_PROVIDER_SITE_OTHER): Admitting: Podiatry

## 2023-12-27 DIAGNOSIS — E1140 Type 2 diabetes mellitus with diabetic neuropathy, unspecified: Secondary | ICD-10-CM | POA: Diagnosis not present

## 2023-12-27 DIAGNOSIS — M2042 Other hammer toe(s) (acquired), left foot: Secondary | ICD-10-CM

## 2023-12-27 DIAGNOSIS — C4442 Squamous cell carcinoma of skin of scalp and neck: Secondary | ICD-10-CM | POA: Insufficient documentation

## 2023-12-27 DIAGNOSIS — M216X9 Other acquired deformities of unspecified foot: Secondary | ICD-10-CM

## 2023-12-27 NOTE — Progress Notes (Signed)
 Subjective:  Patient ID: Joe [name], male    DOB: [birth year],  MRN: [id number] HPI Chief Complaint  Patient presents with   Diabetes    Patient needs evaluation for diabetic shoes and insoles-gave him information for [facility]   He has had diabetic shoes in the past.  He states that they worked really well for him his A1c is under good control at this point.  He does have a history of diabetic peripheral neuropathy and also mild foot drop on the left side.  73 y.o. male presents with the above complaint.   ROS: Denies fever chills nausea vomit muscle aches pains calf pain back pain chest pain shortness of breath.  Past Medical History:  Diagnosis Date   Anxiety    Diabetes mellitus without complication (HCC)    No past surgical history on file.  Current Outpatient Medications:    carvedilol (COREG) 6.25 MG tablet, Take 6.25 mg by mouth., Disp: , Rfl:    famotidine (PEPCID) 20 MG tablet, Take 20 mg by mouth daily., Disp: , Rfl:    JARDIANCE 25 MG TABS tablet, TAKE 1 TABLET DAILY BEFORE BREAKFAST FOR DIABETES, Disp: , Rfl:    OZEMPIC, 2 MG/DOSE, 8 MG/3ML SOPN, INJECT 2 MG UNDER THE SKIN WEEKLY, Disp: , Rfl:    BYDUREON 2 MG PEN, , Disp: , Rfl:    diazepam (VALIUM) 2 MG tablet, , Disp: , Rfl:    esomeprazole (NEXIUM) 40 MG capsule, , Disp: , Rfl:    losartan (COZAAR) 100 MG tablet, , Disp: , Rfl:    metFORMIN (GLUCOPHAGE-XR) 500 MG 24 hr tablet, , Disp: , Rfl:    ONETOUCH VERIO test strip, , Disp: , Rfl:    rosuvastatin (CRESTOR) 10 MG tablet, , Disp: , Rfl:    sildenafil (VIAGRA) 100 MG tablet, Take 100 mg by mouth as needed., Disp: , Rfl:   Allergies  Allergen Reactions   Aspirin Hives    and boils   Ibuprofen Hives    and boils   Review of Systems Objective:  There were no vitals filed for this visit.  General: Well developed, nourished, in no acute distress, alert and oriented x3   Dermatological: Skin is warm, dry and supple bilateral. Nails x 10 are well  maintained; remaining integument appears unremarkable at this time. There are no open sores, no preulcerative lesions, no rash or signs of infection present.  Vascular: Dorsalis Pedis artery and Posterior Tibial artery pedal pulses are 2/4 bilateral with immedate capillary fill time. Pedal hair growth present. No varicosities and no lower extremity edema present bilateral.   Neruologic: Grossly intact via light touch bilateral. Vibratory intact via tuning fork bilateral. Protective threshold with Semmes Wienstein monofilament intact but slightly diminished to certain areas.  To all pedal sites bilateral. Patellar and Achilles deep tendon reflexes 2+ bilateral. No Babinski or clonus noted bilateral.   Musculoskeletal: No gross boney pedal deformities bilateral. No pain, crepitus, or limitation noted with foot and ankle range of motion bilateral. Muscular strength 5/5 in all groups tested bilateral.  Left anterior group particular tibialis anterior tendon is slightly erythematous.  Very tight gastrosoleus complex.  Mild flexible hammertoe deformities bilateral.  No preulcerative lesions.  Gait: Unassisted, Nonantalgic.    Radiographs:  None taken  Assessment & Plan:   Assessment: Hammertoe deformity.  Weak anterior extenders left.  Diabetic peripheral neuropathy.  Plan: Mobic at her request diabetic shoes at this point to help prevent any breakdown due to the digital deformities  and early neuropathy.  This should help with his weakness in his left foot and tight gastrosoleus complex.     [name] T. [place], [place]

## 2024-01-10 DIAGNOSIS — Z87891 Personal history of nicotine dependence: Secondary | ICD-10-CM | POA: Diagnosis not present

## 2024-01-10 DIAGNOSIS — Z136 Encounter for screening for cardiovascular disorders: Secondary | ICD-10-CM | POA: Diagnosis not present

## 2024-01-10 DIAGNOSIS — Z23 Encounter for immunization: Secondary | ICD-10-CM | POA: Diagnosis not present

## 2024-01-10 DIAGNOSIS — K7460 Unspecified cirrhosis of liver: Secondary | ICD-10-CM | POA: Diagnosis not present

## 2024-02-01 DIAGNOSIS — E669 Obesity, unspecified: Secondary | ICD-10-CM | POA: Diagnosis not present

## 2024-02-01 DIAGNOSIS — E1169 Type 2 diabetes mellitus with other specified complication: Secondary | ICD-10-CM | POA: Diagnosis not present

## 2024-02-06 DIAGNOSIS — R200 Anesthesia of skin: Secondary | ICD-10-CM | POA: Diagnosis not present

## 2024-02-06 DIAGNOSIS — E1159 Type 2 diabetes mellitus with other circulatory complications: Secondary | ICD-10-CM | POA: Diagnosis not present

## 2024-02-06 DIAGNOSIS — E1169 Type 2 diabetes mellitus with other specified complication: Secondary | ICD-10-CM | POA: Diagnosis not present

## 2024-02-06 DIAGNOSIS — E785 Hyperlipidemia, unspecified: Secondary | ICD-10-CM | POA: Diagnosis not present

## 2024-02-06 DIAGNOSIS — E1165 Type 2 diabetes mellitus with hyperglycemia: Secondary | ICD-10-CM | POA: Diagnosis not present

## 2024-02-06 DIAGNOSIS — I152 Hypertension secondary to endocrine disorders: Secondary | ICD-10-CM | POA: Diagnosis not present

## 2024-02-06 DIAGNOSIS — E7800 Pure hypercholesterolemia, unspecified: Secondary | ICD-10-CM | POA: Diagnosis not present

## 2024-02-06 DIAGNOSIS — R202 Paresthesia of skin: Secondary | ICD-10-CM | POA: Diagnosis not present

## 2024-02-06 DIAGNOSIS — R5383 Other fatigue: Secondary | ICD-10-CM | POA: Diagnosis not present

## 2024-03-04 DIAGNOSIS — M21372 Foot drop, left foot: Secondary | ICD-10-CM | POA: Diagnosis not present

## 2024-03-04 DIAGNOSIS — R0789 Other chest pain: Secondary | ICD-10-CM | POA: Diagnosis not present

## 2024-03-24 ENCOUNTER — Ambulatory Visit
Admission: RE | Admit: 2024-03-24 | Discharge: 2024-03-24 | Disposition: A | Discharge status: Home / Self Care | Source: Ambulatory Visit | Attending: Physician Assistant | Admitting: Physician Assistant

## 2024-03-24 ENCOUNTER — Other Ambulatory Visit: Payer: Self-pay | Admitting: Physician Assistant

## 2024-03-24 DIAGNOSIS — R918 Other nonspecific abnormal finding of lung field: Secondary | ICD-10-CM | POA: Diagnosis not present

## 2024-03-24 DIAGNOSIS — R079 Chest pain, unspecified: Secondary | ICD-10-CM

## 2024-03-24 DIAGNOSIS — I10 Essential (primary) hypertension: Secondary | ICD-10-CM | POA: Diagnosis not present

## 2024-03-24 DIAGNOSIS — K7581 Nonalcoholic steatohepatitis (NASH): Secondary | ICD-10-CM | POA: Diagnosis not present

## 2024-03-24 DIAGNOSIS — E1169 Type 2 diabetes mellitus with other specified complication: Secondary | ICD-10-CM | POA: Diagnosis not present

## 2024-03-24 DIAGNOSIS — K7460 Unspecified cirrhosis of liver: Secondary | ICD-10-CM | POA: Diagnosis not present

## 2024-03-24 DIAGNOSIS — K219 Gastro-esophageal reflux disease without esophagitis: Secondary | ICD-10-CM | POA: Diagnosis not present

## 2024-03-24 DIAGNOSIS — E785 Hyperlipidemia, unspecified: Secondary | ICD-10-CM | POA: Diagnosis not present

## 2024-03-26 ENCOUNTER — Ambulatory Visit
Admission: RE | Admit: 2024-03-26 | Discharge: 2024-03-26 | Disposition: A | Discharge status: Home / Self Care | Source: Ambulatory Visit | Attending: Physician Assistant | Admitting: Physician Assistant

## 2024-03-26 ENCOUNTER — Other Ambulatory Visit: Payer: Self-pay | Admitting: Physician Assistant

## 2024-03-26 DIAGNOSIS — R079 Chest pain, unspecified: Secondary | ICD-10-CM | POA: Diagnosis not present

## 2024-03-26 DIAGNOSIS — M898X8 Other specified disorders of bone, other site: Secondary | ICD-10-CM | POA: Diagnosis not present

## 2024-03-26 DIAGNOSIS — R9389 Abnormal findings on diagnostic imaging of other specified body structures: Secondary | ICD-10-CM

## 2024-03-26 MED ORDER — IOPAMIDOL (ISOVUE-300) INJECTION 61%
100.0000 mL | Freq: Once | INTRAVENOUS | Status: AC | PRN
  Administered 2024-03-26: 100 mL via INTRAVENOUS

## 2024-03-27 ENCOUNTER — Other Ambulatory Visit (HOSPITAL_COMMUNITY): Payer: Self-pay | Admitting: Family Medicine

## 2024-03-27 DIAGNOSIS — C7951 Secondary malignant neoplasm of bone: Secondary | ICD-10-CM

## 2024-03-28 ENCOUNTER — Encounter (HOSPITAL_COMMUNITY): Payer: Self-pay

## 2024-03-28 ENCOUNTER — Encounter: Payer: Self-pay | Admitting: Medical Oncology

## 2024-03-28 ENCOUNTER — Ambulatory Visit (HOSPITAL_COMMUNITY)
Admission: RE | Admit: 2024-03-28 | Discharge: 2024-03-28 | Disposition: A | Discharge status: Home / Self Care | Source: Ambulatory Visit | Attending: Family Medicine | Admitting: Family Medicine

## 2024-03-28 ENCOUNTER — Other Ambulatory Visit: Payer: Self-pay

## 2024-03-28 ENCOUNTER — Inpatient Hospital Stay (HOSPITAL_COMMUNITY)
Admission: EM | Admit: 2024-03-28 | Discharge: 2024-03-31 | DRG: 841 | Disposition: A | Discharge status: Home / Self Care | Attending: Family Medicine | Admitting: Family Medicine

## 2024-03-28 DIAGNOSIS — M47812 Spondylosis without myelopathy or radiculopathy, cervical region: Secondary | ICD-10-CM | POA: Diagnosis not present

## 2024-03-28 DIAGNOSIS — N281 Cyst of kidney, acquired: Secondary | ICD-10-CM | POA: Diagnosis not present

## 2024-03-28 DIAGNOSIS — Z604 Social exclusion and rejection: Secondary | ICD-10-CM | POA: Diagnosis not present

## 2024-03-28 DIAGNOSIS — C801 Malignant (primary) neoplasm, unspecified: Secondary | ICD-10-CM | POA: Diagnosis not present

## 2024-03-28 DIAGNOSIS — M21372 Foot drop, left foot: Secondary | ICD-10-CM | POA: Diagnosis present

## 2024-03-28 DIAGNOSIS — M898X9 Other specified disorders of bone, unspecified site: Secondary | ICD-10-CM | POA: Diagnosis not present

## 2024-03-28 DIAGNOSIS — Z66 Do not resuscitate: Secondary | ICD-10-CM | POA: Diagnosis present

## 2024-03-28 DIAGNOSIS — I10 Essential (primary) hypertension: Secondary | ICD-10-CM | POA: Diagnosis not present

## 2024-03-28 DIAGNOSIS — R634 Abnormal weight loss: Secondary | ICD-10-CM | POA: Diagnosis not present

## 2024-03-28 DIAGNOSIS — G893 Neoplasm related pain (acute) (chronic): Secondary | ICD-10-CM | POA: Diagnosis present

## 2024-03-28 DIAGNOSIS — K7460 Unspecified cirrhosis of liver: Secondary | ICD-10-CM | POA: Diagnosis not present

## 2024-03-28 DIAGNOSIS — C9030 Solitary plasmacytoma not having achieved remission: Principal | ICD-10-CM | POA: Diagnosis present

## 2024-03-28 DIAGNOSIS — Z79899 Other long term (current) drug therapy: Secondary | ICD-10-CM

## 2024-03-28 DIAGNOSIS — E869 Volume depletion, unspecified: Secondary | ICD-10-CM | POA: Diagnosis present

## 2024-03-28 DIAGNOSIS — E870 Hyperosmolality and hypernatremia: Secondary | ICD-10-CM | POA: Diagnosis not present

## 2024-03-28 DIAGNOSIS — K7581 Nonalcoholic steatohepatitis (NASH): Secondary | ICD-10-CM | POA: Diagnosis present

## 2024-03-28 DIAGNOSIS — M4802 Spinal stenosis, cervical region: Secondary | ICD-10-CM | POA: Diagnosis present

## 2024-03-28 DIAGNOSIS — E1140 Type 2 diabetes mellitus with diabetic neuropathy, unspecified: Secondary | ICD-10-CM | POA: Diagnosis present

## 2024-03-28 DIAGNOSIS — Z7984 Long term (current) use of oral hypoglycemic drugs: Secondary | ICD-10-CM

## 2024-03-28 DIAGNOSIS — N179 Acute kidney failure, unspecified: Secondary | ICD-10-CM | POA: Diagnosis present

## 2024-03-28 DIAGNOSIS — K8020 Calculus of gallbladder without cholecystitis without obstruction: Secondary | ICD-10-CM | POA: Diagnosis not present

## 2024-03-28 DIAGNOSIS — Z6825 Body mass index (BMI) 25.0-25.9, adult: Secondary | ICD-10-CM | POA: Diagnosis not present

## 2024-03-28 DIAGNOSIS — K219 Gastro-esophageal reflux disease without esophagitis: Secondary | ICD-10-CM | POA: Diagnosis not present

## 2024-03-28 DIAGNOSIS — E785 Hyperlipidemia, unspecified: Secondary | ICD-10-CM | POA: Diagnosis present

## 2024-03-28 DIAGNOSIS — C7951 Secondary malignant neoplasm of bone: Secondary | ICD-10-CM | POA: Diagnosis not present

## 2024-03-28 DIAGNOSIS — E119 Type 2 diabetes mellitus without complications: Secondary | ICD-10-CM | POA: Diagnosis not present

## 2024-03-28 DIAGNOSIS — D759 Disease of blood and blood-forming organs, unspecified: Secondary | ICD-10-CM | POA: Diagnosis not present

## 2024-03-28 DIAGNOSIS — F419 Anxiety disorder, unspecified: Secondary | ICD-10-CM | POA: Diagnosis present

## 2024-03-28 DIAGNOSIS — F39 Unspecified mood [affective] disorder: Secondary | ICD-10-CM | POA: Diagnosis present

## 2024-03-28 DIAGNOSIS — M899 Disorder of bone, unspecified: Secondary | ICD-10-CM | POA: Diagnosis not present

## 2024-03-28 DIAGNOSIS — R000 Tachycardia, unspecified: Secondary | ICD-10-CM | POA: Diagnosis not present

## 2024-03-28 LAB — CBC WITH DIFFERENTIAL/PLATELET
Abs Immature Granulocytes: 0.02 K/uL (ref 0.00–0.07)
Basophils Absolute: 0.1 K/uL (ref 0.0–0.1)
Basophils Relative: 1 %
Eosinophils Absolute: 0.2 K/uL (ref 0.0–0.5)
Eosinophils Relative: 2 %
HCT: 46.3 % (ref 39.0–52.0)
Hemoglobin: 14.7 g/dL (ref 13.0–17.0)
Immature Granulocytes: 0 %
Lymphocytes Relative: 30 %
Lymphs Abs: 2.9 K/uL (ref 0.7–4.0)
MCH: 33 pg (ref 26.0–34.0)
MCHC: 31.7 g/dL (ref 30.0–36.0)
MCV: 103.8 fL — ABNORMAL HIGH (ref 80.0–100.0)
Monocytes Absolute: 0.6 K/uL (ref 0.1–1.0)
Monocytes Relative: 6 %
Neutro Abs: 5.9 K/uL (ref 1.7–7.7)
Neutrophils Relative %: 61 %
Platelets: 186 K/uL (ref 150–400)
RBC: 4.46 MIL/uL (ref 4.22–5.81)
RDW: 12.8 % (ref 11.5–15.5)
WBC: 9.7 K/uL (ref 4.0–10.5)
nRBC: 0 % (ref 0.0–0.2)

## 2024-03-28 LAB — URINALYSIS, ROUTINE W REFLEX MICROSCOPIC
Bilirubin Urine: NEGATIVE
Glucose, UA: >=500 mg/dL — AB
Hgb urine dipstick: NEGATIVE
Ketones, ur: NEGATIVE mg/dL
Nitrite: NEGATIVE
Protein, ur: NEGATIVE mg/dL
Specific Gravity, Urine: 1.007 (ref 1.005–1.030)
pH: 7 (ref 5.0–8.0)

## 2024-03-28 LAB — COMPREHENSIVE METABOLIC PANEL WITH GFR
ALT: 13 U/L (ref 0–44)
AST: 27 U/L (ref 15–41)
Albumin: 3.5 g/dL (ref 3.5–5.0)
Alkaline Phosphatase: 125 U/L (ref 38–126)
Anion gap: 13 (ref 5–15)
BUN: 29 mg/dL — ABNORMAL HIGH (ref 8–23)
CO2: 28 mmol/L (ref 22–32)
Calcium: >15 mg/dL (ref 8.9–10.3)
Chloride: 100 mmol/L (ref 98–111)
Creatinine, Ser: 1.28 mg/dL — ABNORMAL HIGH (ref 0.61–1.24)
GFR, Estimated: 58 mL/min — ABNORMAL LOW (ref >60)
Glucose, Bld: 98 mg/dL (ref 70–99)
Potassium: 4 mmol/L (ref 3.5–5.1)
Sodium: 141 mmol/L (ref 135–145)
Total Bilirubin: 1 mg/dL (ref 0.0–1.2)
Total Protein: 8.3 g/dL — ABNORMAL HIGH (ref 6.5–8.1)

## 2024-03-28 LAB — PHOSPHORUS: Phosphorus: 3.6 mg/dL (ref 2.5–4.6)

## 2024-03-28 LAB — CALCIUM: Calcium: 15 mg/dL (ref 8.9–10.3)

## 2024-03-28 LAB — MRSA NEXT GEN BY PCR, NASAL: MRSA by PCR Next Gen: NOT DETECTED

## 2024-03-28 LAB — MAGNESIUM: Magnesium: 2.3 mg/dL (ref 1.7–2.4)

## 2024-03-28 LAB — LIPASE, BLOOD: Lipase: 23 U/L (ref 11–51)

## 2024-03-28 LAB — VITAMIN D 25 HYDROXY (VIT D DEFICIENCY, FRACTURES): Vit D, 25-Hydroxy: 62.77 ng/mL (ref 30–100)

## 2024-03-28 MED ORDER — ORAL CARE MOUTH RINSE: 15.0000 mL | OROMUCOSAL | Status: DC | PRN

## 2024-03-28 MED ORDER — SODIUM CHLORIDE 0.9 % IV BOLUS
1000.0000 mL | Freq: Once | INTRAVENOUS | Status: AC
  Administered 2024-03-28: 1000 mL via INTRAVENOUS

## 2024-03-28 MED ORDER — CALCITONIN (SALMON) 200 UNIT/ML IJ SOLN
280.0000 [IU] | Freq: Two times a day (BID) | INTRAMUSCULAR | Status: DC
  Filled 2024-03-28: qty 1.4

## 2024-03-28 MED ORDER — GADOBUTROL 1 MMOL/ML IV SOLN
7.0000 mL | Freq: Once | INTRAVENOUS | Status: AC | PRN
  Administered 2024-03-28: 7 mL via INTRAVENOUS

## 2024-03-28 MED ORDER — ALBUTEROL SULFATE (2.5 MG/3ML) 0.083% IN NEBU: 2.5000 mg | INHALATION_SOLUTION | RESPIRATORY_TRACT | Status: DC | PRN

## 2024-03-28 MED ORDER — ACETAMINOPHEN 500 MG PO TABS: 1000.0000 mg | ORAL_TABLET | Freq: Four times a day (QID) | ORAL | Status: DC | PRN

## 2024-03-28 MED ORDER — PANTOPRAZOLE SODIUM 40 MG PO TBEC
40.0000 mg | DELAYED_RELEASE_TABLET | Freq: Every day | ORAL | Status: DC
  Administered 2024-03-29 – 2024-03-31 (×3): 40 mg via ORAL
  Filled 2024-03-28 (×3): qty 1

## 2024-03-28 MED ORDER — ONDANSETRON HCL 4 MG/2ML IJ SOLN: 4.0000 mg | Freq: Four times a day (QID) | INTRAMUSCULAR | Status: DC | PRN

## 2024-03-28 MED ORDER — SODIUM CHLORIDE 0.9% FLUSH
3.0000 mL | Freq: Two times a day (BID) | INTRAVENOUS | Status: DC
  Administered 2024-03-28 – 2024-03-30 (×5): 3 mL via INTRAVENOUS

## 2024-03-28 MED ORDER — ZOLEDRONIC ACID 4 MG/100ML IV SOLN
4.0000 mg | Freq: Once | INTRAVENOUS | Status: AC
  Administered 2024-03-28: 4 mg via INTRAVENOUS
  Filled 2024-03-28: qty 100

## 2024-03-28 MED ORDER — OXYCODONE HCL 5 MG PO TABS
5.0000 mg | ORAL_TABLET | ORAL | Status: DC | PRN
  Administered 2024-03-30 – 2024-03-31 (×3): 5 mg via ORAL
  Filled 2024-03-28 (×3): qty 1

## 2024-03-28 MED ORDER — CHLORHEXIDINE GLUCONATE CLOTH 2 % EX PADS
6.0000 | MEDICATED_PAD | Freq: Every day | CUTANEOUS | Status: DC
  Administered 2024-03-28 – 2024-03-30 (×3): 6 via TOPICAL

## 2024-03-28 MED ORDER — ROSUVASTATIN CALCIUM 10 MG PO TABS
10.0000 mg | ORAL_TABLET | Freq: Every day | ORAL | Status: DC
  Administered 2024-03-29 – 2024-03-31 (×3): 10 mg via ORAL
  Filled 2024-03-28 (×3): qty 1

## 2024-03-28 MED ORDER — CALCITONIN (SALMON) 200 UNIT/ML IJ SOLN
280.0000 [IU] | Freq: Two times a day (BID) | INTRAMUSCULAR | Status: DC
  Administered 2024-03-28: 280 [IU] via INTRAMUSCULAR
  Filled 2024-03-28 (×2): qty 1.4

## 2024-03-28 MED ORDER — CARVEDILOL 6.25 MG PO TABS
6.2500 mg | ORAL_TABLET | Freq: Two times a day (BID) | ORAL | Status: DC
  Administered 2024-03-28 – 2024-03-31 (×6): 6.25 mg via ORAL
  Filled 2024-03-28 (×6): qty 1

## 2024-03-28 MED ORDER — OXYCODONE HCL 5 MG PO TABS: 2.5000 mg | ORAL_TABLET | ORAL | Status: DC | PRN

## 2024-03-28 MED ORDER — SODIUM CHLORIDE 0.9 % IV SOLN: INTRAVENOUS | Status: AC

## 2024-03-28 MED ORDER — HYDROMORPHONE HCL 1 MG/ML IJ SOLN
0.5000 mg | INTRAMUSCULAR | Status: DC | PRN
  Administered 2024-03-29 – 2024-03-30 (×2): 0.5 mg via INTRAVENOUS
  Filled 2024-03-28 (×2): qty 1

## 2024-03-28 MED ORDER — FAMOTIDINE 20 MG PO TABS
20.0000 mg | ORAL_TABLET | Freq: Every day | ORAL | Status: DC
  Administered 2024-03-29 – 2024-03-31 (×3): 20 mg via ORAL
  Filled 2024-03-28 (×3): qty 1

## 2024-03-28 MED ORDER — POLYETHYLENE GLYCOL 3350 17 G PO PACK: 17.0000 g | PACK | Freq: Every day | ORAL | Status: DC | PRN

## 2024-03-28 MED ORDER — MELATONIN 3 MG PO TABS
6.0000 mg | ORAL_TABLET | Freq: Every evening | ORAL | Status: DC | PRN
  Administered 2024-03-30: 6 mg via ORAL
  Filled 2024-03-28 (×2): qty 2

## 2024-03-28 MED ORDER — SODIUM CHLORIDE (PF) 0.9 % IJ SOLN
INTRAMUSCULAR | Status: AC
  Administered 2024-03-28: 10 mL
  Filled 2024-03-28: qty 10

## 2024-03-28 MED ORDER — DIAZEPAM 2 MG PO TABS
2.0000 mg | ORAL_TABLET | Freq: Two times a day (BID) | ORAL | Status: DC | PRN
  Administered 2024-03-29: 2 mg via ORAL
  Filled 2024-03-28: qty 1

## 2024-03-28 NOTE — H&P (Addendum)
 History and Physical    Joe [name] FMW:[id number] DOB: 1950/12/01 DOA: 03/28/2024  PCP: Merilee Rush   Patient coming from: Home   Chief Complaint:  Chief Complaint  Patient presents with   abnormal labs    HPI:  Joe [name] is a 73 y.o. male with hx of diabetes type 2, hypertension, hyperlipidemia, [name] cirrhosis, neuropathy, GERD, who was recently evaluated by primary care for complaints of chest pain and had chest imaging demonstrating lytic lesions which has been followed by CT and now MRI, referred to the emergency department for abnormal labs with severe range Hypercalcemia.  His recent history notable for about 5 to 6 weeks of bony pain in his lower ribs, back which is worse with movement.  Has had difficulty transferring and mobilizing due to the severity of pain.  Reports associated generalized weakness as well.  Otherwise reports recent onset of paresthesia in the first 4 digits of his hand bilaterally on the left it also extends into the forearm.  No focal weakness.  Denies any neck pain.  Denies any other site of bony pain.  No history of hypercalcemia in the past.  No recent confusion or mental status changes.  Remote history of kidney stones, none recently.  No abdominal pain or GI symptoms.  Does acknowledge significant weight loss about 80+ pounds over the past year, no lymphadenopathy, no night sweats.   Review of Systems:  ROS complete and negative except as marked above   No Known Allergies  Prior to Admission medications   Medication Sig Start Date End Date Taking? Authorizing Provider  carvedilol (COREG) 6.25 MG tablet Take 6.25 mg by mouth 2 (two) times daily with a meal. 10/19/22  Yes [provider]  diazepam (VALIUM) 2 MG tablet Take 2 mg by mouth in the morning and at bedtime. 12/13/15  Yes [provider]  esomeprazole (NEXIUM) 40 MG capsule Take 40 mg by mouth daily before breakfast. 11/26/15  Yes [provider]  famotidine  (PEPCID) 20 MG tablet Take 20 mg by mouth at bedtime. 11/02/23  Yes [provider]  FLONASE ALLERGY RELIEF 50 MCG/ACT nasal spray Place 1-2 sprays into both nostrils in the morning and at bedtime.   Yes [provider]  JARDIANCE 10 MG TABS tablet Take 10 mg by mouth daily.   Yes [provider]  losartan (COZAAR) 100 MG tablet Take 50 mg by mouth daily. 12/28/15  Yes [provider]  Multiple Vitamin (MULTIVITAMIN) tablet Take 1 tablet by mouth daily with breakfast.   Yes [provider]  OZEMPIC, 2 MG/DOSE, 8 MG/3ML SOPN Inject 2 mg into the skin every Saturday. 08/04/22  Yes [provider]  rosuvastatin (CRESTOR) 10 MG tablet Take 10 mg by mouth daily. 02/01/16  Yes [provider]  sildenafil (VIAGRA) 100 MG tablet Take 100 mg by mouth daily as needed (for E.D.).   Yes [provider]  [facility] VERIO test strip  12/26/15   [provider]    Past Medical History:  Diagnosis Date   Anxiety    Diabetes mellitus without complication (HCC)     History reviewed. No pertinent surgical history.   reports that he has never smoked. He does not have any smokeless tobacco history on file. He reports that he does not drink alcohol and does not use drugs.  History reviewed. No pertinent family history.   Physical Exam: Vitals:   03/28/24 1747 03/28/24 2121 03/28/24 2212  BP: 139/65 ROLLEN)  169/60 (!) 164/53  Pulse: (!) 56 65 68  Resp: 18 15 17   Temp: 97.8 F (36.6 C) 98.7 F (37.1 C)   TempSrc: Oral Oral   SpO2: 99% 98% 92%  Weight: 70.8 kg 72.7 kg   Height: 5' 7 (1.702 m)      Gen: Awake, alert, NAD   Neck no lymphadenopathy appreciated CV: Regular, normal S1, S2, no murmurs  Resp: Normal WOB, CTAB  Abd: Flat, normoactive, nontender MSK: Along the left lower ribs near the midclavicular line there is a palpable mass which is tender to palpation.  Lower extremities symmetric, no edema Skin: No rashes or lesions  to exposed skin  Neuro: Alert and interactive, strength is 5 out of 5 and equal in upper extremities, sensation is intact and equal to fine touch throughout.  Median, ulnar, radial distributions intact in the hand.  No sensory change with compression of the carpal tunnel Psych: euthymic, appropriate    Data review:   Labs reviewed, notable for:   Calcium greater than 15 Creatinine 1.28 (baseline 0.45-0.7) Macrocytosis without anemi  Micro:  Results for orders placed or performed during the hospital encounter of 03/28/24  MRSA Next Gen by PCR, Nasal     Status: None   Collection Time: 03/28/24  5:43 PM   Specimen: Nasal Mucosa; Nasal Swab  Result Value Ref Range Status   MRSA by PCR Next Gen NOT DETECTED NOT DETECTED Final    Comment: (NOTE) The GeneXpert MRSA Assay (FDA approved for NASAL specimens only), is one component of a comprehensive MRSA colonization surveillance program. It is not intended to diagnose MRSA infection nor to guide or monitor treatment for MRSA infections. Test performance is not FDA approved in patients less than 40 years old. Performed at [facility], [address], [place], [place] [address]     Imaging reviewed:  MR ABDOMEN WWO CONTRAST Result Date: 03/28/2024 CLINICAL DATA:  New diagnosis of multiple bone metastases. Screening for primary neoplastic process. EXAM: MRI ABDOMEN AND PELVIS WITHOUT AND WITH CONTRAST TECHNIQUE: Multiplanar multisequence MR imaging of the abdomen and pelvis was performed both before and after the administration of intravenous contrast. CONTRAST:  7mL GADAVIST GADOBUTROL 1 MMOL/ML IV SOLN COMPARISON:  None Available. FINDINGS: COMBINED FINDINGS FOR BOTH MR ABDOMEN AND PELVIS Lower chest: Unremarkable MR appearance to the lung bases. No pleural effusion. No pericardial effusion. Normal heart size. Hepatobiliary: The liver is normal in size and configuration. No intrahepatic or extrahepatic bile duct dilatation.  No choledocholithiasis. Small volume dependent gallstones noted without imaging signs of acute cholecystitis. Pancreas: No mass, inflammatory changes or other parenchymal abnormality identified. No main pancreatic duct dilation. Spleen:  Within normal limits in size and appearance. No focal mass. Adrenals/Urinary Tract: Unremarkable adrenal glands. No hydroureteronephrosis. Simple cyst noted in the right kidney lower pole measuring 1.5 x 1.6 cm. There is a smaller simple cyst in the left kidney upper pole, laterally. Stomach/Bowel: Visualized portions within the abdomen are unremarkable. No disproportionate dilation of bowel loops. Multiple colonic diverticula noted without diverticulitis. Vascular/Lymphatic: No pathologically enlarged lymph nodes identified. No abdominal aortic aneurysm demonstrated. No ascites. Reproductive: The prostate is not well evaluated on this exam. However, there is heterogeneous transitional zone, especially near the apex. Correlation with serum PSA is recommended to determine the need for dedicated pelvic MRI as per prostate protocol. Other:  None. Musculoskeletal: There are extensive mixed T2 hypointense and hyperintense lesions throughout the imaged bones compatible with metastases. There are several rib  lesions with associated soft tissue component, as seen on the coronal images, better evaluated on the recent chest CT scan. IMPRESSION: 1. Extensive osseous metastases. There are several rib lesions with associated soft tissue component, better evaluated on the recent chest CT scan. 2. No primary neoplastic process identified in the abdomen or pelvis. 3. Heterogeneous prostate transitional zone, especially near the apex. Correlation with serum PSA is recommended to determine the need for dedicated pelvic MRI as per prostate protocol. 4. Cholelithiasis without imaging signs of acute cholecystitis. 5. Multiple other nonacute observations, as described above. Electronically Signed   By:  Ree Molt M.D.   On: 03/28/2024 18:53   MR PELVIS W WO CONTRAST Result Date: 03/28/2024 CLINICAL DATA:  New diagnosis of multiple bone metastases. Screening for primary neoplastic process. EXAM: MRI ABDOMEN AND PELVIS WITHOUT AND WITH CONTRAST TECHNIQUE: Multiplanar multisequence MR imaging of the abdomen and pelvis was performed both before and after the administration of intravenous contrast. CONTRAST:  7mL GADAVIST GADOBUTROL 1 MMOL/ML IV SOLN COMPARISON:  None Available. FINDINGS: COMBINED FINDINGS FOR BOTH MR ABDOMEN AND PELVIS Lower chest: Unremarkable MR appearance to the lung bases. No pleural effusion. No pericardial effusion. Normal heart size. Hepatobiliary: The liver is normal in size and configuration. No intrahepatic or extrahepatic bile duct dilatation. No choledocholithiasis. Small volume dependent gallstones noted without imaging signs of acute cholecystitis. Pancreas: No mass, inflammatory changes or other parenchymal abnormality identified. No main pancreatic duct dilation. Spleen:  Within normal limits in size and appearance. No focal mass. Adrenals/Urinary Tract: Unremarkable adrenal glands. No hydroureteronephrosis. Simple cyst noted in the right kidney lower pole measuring 1.5 x 1.6 cm. There is a smaller simple cyst in the left kidney upper pole, laterally. Stomach/Bowel: Visualized portions within the abdomen are unremarkable. No disproportionate dilation of bowel loops. Multiple colonic diverticula noted without diverticulitis. Vascular/Lymphatic: No pathologically enlarged lymph nodes identified. No abdominal aortic aneurysm demonstrated. No ascites. Reproductive: The prostate is not well evaluated on this exam. However, there is heterogeneous transitional zone, especially near the apex. Correlation with serum PSA is recommended to determine the need for dedicated pelvic MRI as per prostate protocol. Other:  None. Musculoskeletal: There are extensive mixed T2 hypointense and  hyperintense lesions throughout the imaged bones compatible with metastases. There are several rib lesions with associated soft tissue component, as seen on the coronal images, better evaluated on the recent chest CT scan. IMPRESSION: 1. Extensive osseous metastases. There are several rib lesions with associated soft tissue component, better evaluated on the recent chest CT scan. 2. No primary neoplastic process identified in the abdomen or pelvis. 3. Heterogeneous prostate transitional zone, especially near the apex. Correlation with serum PSA is recommended to determine the need for dedicated pelvic MRI as per prostate protocol. 4. Cholelithiasis without imaging signs of acute cholecystitis. 5. Multiple other nonacute observations, as described above. Electronically Signed   By: Ree Molt M.D.   On: 03/28/2024 18:53    EKG: Personally reviewed First-degree AV block, no QT shortening  ED Course:  Treated with 1 L NS   Assessment/Plan:  73 y.o. male with hx diabetes type 2, hypertension, hyperlipidemia, Nash cirrhosis, neuropathy, GERD, who was recently evaluated by primary care for complaints of chest pain and had chest imaging demonstrating lytic lesions which has been followed by CT and now MRI, referred to the emergency department for abnormal labs with severe range Hypercalcemia of malignancy.   Severe range hypercalcemia of malignancy  No hx prior hyperCa, abrupt onset with  Ca > 15 on admission, verified on repeat. Relatively minimal symptoms, although does have associated AKI stage II. His imaging demonstrates diffuse lytic lesions, likely hyperCa of malignancy although unclear why such abrupt onset.  - S/p 1 L NS, continue 200 cc an hour for now - Calcitonin 4 u/kg twice daily - Zolendronate 4 mg x 1 - Follow BMP every 6 hours - PTH, PTHRP, vitamin D 25 OH and 1-25 OH, SPEP for evaluation  Mixed lytic lesions Reports symptomatic with bony chest wall pain x 5 to 6 weeks. Seen by  PCP who ordered CXR initially showing lesions on 7/28; there was question of pleural mass as well but not seen on F/u imaging. CT Chest 7/30 demonstrates extensive pulm lytic lesions throughout imaged bones compatible with metastases, also destruction and expansile lytic lesions with soft tissue and several ribs largest anterior lateral right fourth rib measuring up to 2 x 2.9 cm. MRI abdomen and pelvis demonstrating extensive osseous mets, several rib lesions with associated soft tissue component,  No primary neoplastic process in the abdomen or pelvis,  Heterogenous prostate near apex, recommend correlation with PSA and possible dedicated pelvic MRI with prostate protocol.  He was planned to see  Oncology on 8/4.  - Routine oncology consult in the morning, not contacted overnight - Ordered for IR image guided bone biopsy for tissue diagnosis  Acute kidney injury stage 2 Baseline creatinine approximately 0.45-0.7, elevated to 1.28 on admission.  Likely prerenal in setting of severe range hypercalcemia - IV fluids for above - Check PVR - Hold home losartan, Jardiance  Heterogenous prostate on imaging  -Will send for PSA   ? Bilateral carpal tunnel  ? Cervical radiculopathy  Reports symptoms in this distribution 1st 4 fingers with paresthesias, although extends to forearm on the left. No weakness or neck pain.  -Routine MRI C-spine to evaluate for compressive lesion given his osseous mets  Malignancy related pain -Pain control: Tylenol as needed mild, oxycodone 2.5/5 mg for moderate/severe, Dilaudid 0.5 mg IV every 4 hours as needed for breakthrough  Debility - PT OT consult  Chronic medical problems: Diabetes type 2: Would discontinue his Ozempic considering his significant weight loss related to malignancy.  Holding his Jardiance in the setting of volume depletion Hypertension: Continue osm Coreg.  Holding losartan Hyperlipidemia: Continues home rosuvastatin NASH cirrhosis compensated:  Outpatient hepatology follow-up Neuropathy: Noted, not on medication  GERD: Stable home PPI Mood disorder: Continue home diazepam twice daily as needed    Body mass index is 25.1 kg/m.    DVT prophylaxis:  SCDs Code Status:  DNR/DNI(Do NOT Intubate); confirmed with patient  Diet:  Diet Orders (From admission, onward)     Start     Ordered   03/29/24 0001  Diet NPO time specified  Diet effective midnight        03/28/24 2014   03/28/24 1959  Diet regular Room service appropriate? Yes; Fluid consistency: Thin  Diet effective now       Question Answer Comment  Room service appropriate? Yes   Fluid consistency: Thin      03/28/24 2004           Family Communication: None Consults:  None   Admission status:   Inpatient, Step Down Unit  Severity of Illness: The appropriate patient status for this patient is INPATIENT. Inpatient status is judged to be reasonable and necessary in order to provide the required intensity of service to ensure the patient's safety. The patient's presenting symptoms,  physical exam findings, and initial radiographic and laboratory data in the context of their chronic comorbidities is felt to place them at high risk for further clinical deterioration. Furthermore, it is not anticipated that the patient will be medically stable for discharge from the hospital within 2 midnights of admission.   * I certify that at the point of admission it is my clinical judgment that the patient will require inpatient hospital care spanning beyond 2 midnights from the point of admission due to high intensity of service, high risk for further deterioration and high frequency of surveillance required.*   Dorn Dawson, MD [facility]  How to contact the [facility] Attending or Consulting provider 7A - 7P or covering provider during after hours 7P -7A, for this patient.  Check the care team in [facility] and look for a) attending/consulting [facility] provider listed and b) the [facility]  team listed Log into [email or web address] and use [facility] universal password to access. If you do not have the password, please contact the hospital operator. Locate the [facility] provider you are looking for under [facility] and page to a number that you can be directly reached. If you still have difficulty reaching the provider, please page the [facility] (Director on Call) for the Hospitalists listed on amion for assistance.  03/28/2024, 10:57 PM

## 2024-03-28 NOTE — ED Provider Notes (Signed)
 [name] AT [facility] Provider Note   CSN: [id number] Arrival date & time: 03/28/24  1738     Patient presents with: abnormal labs   Joe [name] is a 73 y.o. male.  With past history significant for type 2 diabetes, hypertension, liver cirrhosis secondary to [name] presents to the emergency department with concerns of abnormal labs.  Patient reportedly had MR imaging that was being performed today for concerns of a lump in my chest.  He reports that he is also being evaluated for abnormal CT imaging of his back as there were spots on it.  He denies any history of hypercalcemia.  Denies any dysuria, hematuria, new or worsening pain in his back, abdominal pain, nausea, vomiting, or epigastric discomfort.  No reported concerns for dehydration as he states he has been eating and drinking at baseline.  He does report some increased constipation recently though but denies any vomiting or diarrhea.  HPI     Prior to Admission medications   Medication Sig Start Date End Date Taking? Authorizing Provider  carvedilol (COREG) 6.25 MG tablet Take 6.25 mg by mouth 2 (two) times daily with a meal. 10/19/22  Yes [provider]  diazepam (VALIUM) 2 MG tablet Take 2 mg by mouth in the morning and at bedtime. 12/13/15  Yes [provider]  esomeprazole (NEXIUM) 40 MG capsule Take 40 mg by mouth daily before breakfast. 11/26/15  Yes [provider]  famotidine (PEPCID) 20 MG tablet Take 20 mg by mouth at bedtime. 11/02/23  Yes [provider]  FLONASE ALLERGY RELIEF 50 MCG/ACT nasal spray Place 1-2 sprays into both nostrils in the morning and at bedtime.   Yes [provider]  JARDIANCE 10 MG TABS tablet Take 10 mg by mouth daily.   Yes [provider]  losartan (COZAAR) 100 MG tablet Take 50 mg by mouth daily. 12/28/15  Yes [provider]  Multiple Vitamin (MULTIVITAMIN) tablet Take 1 tablet by mouth daily with  breakfast.   Yes [provider]  OZEMPIC, 2 MG/DOSE, 8 MG/3ML SOPN Inject 2 mg into the skin every Saturday. 08/04/22  Yes [provider]  rosuvastatin (CRESTOR) 10 MG tablet Take 10 mg by mouth daily. 02/01/16  Yes [provider]  sildenafil (VIAGRA) 100 MG tablet Take 100 mg by mouth daily as needed (for E.D.).   Yes [provider]  [facility] VERIO test strip  12/26/15   [provider]    Allergies: Patient has no known allergies.    Review of Systems  Constitutional:        Abnormal labs  All other systems reviewed and are negative.   Updated Vital Signs BP (!) 169/60 (BP Location: Left Arm)   Pulse 65   Temp 98.7 F (37.1 C) (Oral)   Resp 15   Ht 5' 7 (1.702 m)   Wt 72.7 kg   SpO2 98%   BMI 25.10 kg/m   Physical Exam Vitals and nursing note reviewed.  Constitutional:      General: He is not in acute distress.    Appearance: He is well-developed.  HENT:     Head: Normocephalic and atraumatic.  Eyes:     Conjunctiva/sclera: Conjunctivae normal.  Cardiovascular:     Rate and Rhythm: Normal rate and regular rhythm.     Heart sounds: No murmur heard. Pulmonary:     Effort: Pulmonary effort is normal. No respiratory distress.     Breath sounds:  Normal breath sounds.  Abdominal:     General: Abdomen is flat. Bowel sounds are normal. There is no distension.     Palpations: Abdomen is soft. There is no mass.     Tenderness: There is no abdominal tenderness. There is no guarding.  Musculoskeletal:        General: No swelling.     Cervical back: Neck supple.  Skin:    General: Skin is warm and dry.     Capillary Refill: Capillary refill takes less than 2 seconds.  Neurological:     Mental Status: He is alert.  Psychiatric:        Mood and Affect: Mood normal.     (all labs ordered are listed, but only abnormal results are displayed) Labs Reviewed  CBC WITH DIFFERENTIAL/PLATELET - Abnormal; Notable for the following  components:      Result Value   MCV 103.8 (*)    All other components within normal limits  COMPREHENSIVE METABOLIC PANEL WITH GFR - Abnormal; Notable for the following components:   BUN 29 (*)    Creatinine, Ser 1.28 (*)    Calcium >15.0 (*)    Total Protein 8.3 (*)    GFR, Estimated 58 (*)    All other components within normal limits  URINALYSIS, ROUTINE W REFLEX MICROSCOPIC - Abnormal; Notable for the following components:   APPearance CLOUDY (*)    Glucose, UA >=500 (*)    Leukocytes,Ua TRACE (*)    Bacteria, UA RARE (*)    All other components within normal limits  MRSA NEXT GEN BY PCR, NASAL  BASIC METABOLIC PANEL WITH GFR  BASIC METABOLIC PANEL WITH GFR  CBC  MAGNESIUM  PHOSPHORUS  TSH  VITAMIN B12  FOLATE  CALCIUM, IONIZED  LIPASE, BLOOD  PARATHYROID HORMONE, INTACT (NO CA)  MAGNESIUM  PHOSPHORUS  VITAMIN D 25 HYDROXY (VIT D DEFICIENCY, FRACTURES)  CALCITRIOL (1,25 DI-OH VIT D)  PTH-RELATED PEPTIDE  CALCIUM  PROTEIN ELECTROPHORESIS, SERUM    EKG: None  Radiology: MR ABDOMEN WWO CONTRAST Result Date: 03/28/2024 CLINICAL DATA:  New diagnosis of multiple bone metastases. Screening for primary neoplastic process. EXAM: MRI ABDOMEN AND PELVIS WITHOUT AND WITH CONTRAST TECHNIQUE: Multiplanar multisequence MR imaging of the abdomen and pelvis was performed both before and after the administration of intravenous contrast. CONTRAST:  7mL GADAVIST GADOBUTROL 1 MMOL/ML IV SOLN COMPARISON:  None Available. FINDINGS: COMBINED FINDINGS FOR BOTH MR ABDOMEN AND PELVIS Lower chest: Unremarkable MR appearance to the lung bases. No pleural effusion. No pericardial effusion. Normal heart size. Hepatobiliary: The liver is normal in size and configuration. No intrahepatic or extrahepatic bile duct dilatation. No choledocholithiasis. Small volume dependent gallstones noted without imaging signs of acute cholecystitis. Pancreas: No mass, inflammatory changes or other parenchymal abnormality  identified. No main pancreatic duct dilation. Spleen:  Within normal limits in size and appearance. No focal mass. Adrenals/Urinary Tract: Unremarkable adrenal glands. No hydroureteronephrosis. Simple cyst noted in the right kidney lower pole measuring 1.5 x 1.6 cm. There is a smaller simple cyst in the left kidney upper pole, laterally. Stomach/Bowel: Visualized portions within the abdomen are unremarkable. No disproportionate dilation of bowel loops. Multiple colonic diverticula noted without diverticulitis. Vascular/Lymphatic: No pathologically enlarged lymph nodes identified. No abdominal aortic aneurysm demonstrated. No ascites. Reproductive: The prostate is not well evaluated on this exam. However, there is heterogeneous transitional zone, especially near the apex. Correlation with serum PSA is recommended to determine the need for dedicated pelvic MRI as per prostate protocol.  Other:  None. Musculoskeletal: There are extensive mixed T2 hypointense and hyperintense lesions throughout the imaged bones compatible with metastases. There are several rib lesions with associated soft tissue component, as seen on the coronal images, better evaluated on the recent chest CT scan. IMPRESSION: 1. Extensive osseous metastases. There are several rib lesions with associated soft tissue component, better evaluated on the recent chest CT scan. 2. No primary neoplastic process identified in the abdomen or pelvis. 3. Heterogeneous prostate transitional zone, especially near the apex. Correlation with serum PSA is recommended to determine the need for dedicated pelvic MRI as per prostate protocol. 4. Cholelithiasis without imaging signs of acute cholecystitis. 5. Multiple other nonacute observations, as described above. Electronically Signed   By: Ree Molt M.D.   On: 03/28/2024 18:53   MR PELVIS W WO CONTRAST Result Date: 03/28/2024 CLINICAL DATA:  New diagnosis of multiple bone metastases. Screening for primary  neoplastic process. EXAM: MRI ABDOMEN AND PELVIS WITHOUT AND WITH CONTRAST TECHNIQUE: Multiplanar multisequence MR imaging of the abdomen and pelvis was performed both before and after the administration of intravenous contrast. CONTRAST:  7mL GADAVIST GADOBUTROL 1 MMOL/ML IV SOLN COMPARISON:  None Available. FINDINGS: COMBINED FINDINGS FOR BOTH MR ABDOMEN AND PELVIS Lower chest: Unremarkable MR appearance to the lung bases. No pleural effusion. No pericardial effusion. Normal heart size. Hepatobiliary: The liver is normal in size and configuration. No intrahepatic or extrahepatic bile duct dilatation. No choledocholithiasis. Small volume dependent gallstones noted without imaging signs of acute cholecystitis. Pancreas: No mass, inflammatory changes or other parenchymal abnormality identified. No main pancreatic duct dilation. Spleen:  Within normal limits in size and appearance. No focal mass. Adrenals/Urinary Tract: Unremarkable adrenal glands. No hydroureteronephrosis. Simple cyst noted in the right kidney lower pole measuring 1.5 x 1.6 cm. There is a smaller simple cyst in the left kidney upper pole, laterally. Stomach/Bowel: Visualized portions within the abdomen are unremarkable. No disproportionate dilation of bowel loops. Multiple colonic diverticula noted without diverticulitis. Vascular/Lymphatic: No pathologically enlarged lymph nodes identified. No abdominal aortic aneurysm demonstrated. No ascites. Reproductive: The prostate is not well evaluated on this exam. However, there is heterogeneous transitional zone, especially near the apex. Correlation with serum PSA is recommended to determine the need for dedicated pelvic MRI as per prostate protocol. Other:  None. Musculoskeletal: There are extensive mixed T2 hypointense and hyperintense lesions throughout the imaged bones compatible with metastases. There are several rib lesions with associated soft tissue component, as seen on the coronal images, better  evaluated on the recent chest CT scan. IMPRESSION: 1. Extensive osseous metastases. There are several rib lesions with associated soft tissue component, better evaluated on the recent chest CT scan. 2. No primary neoplastic process identified in the abdomen or pelvis. 3. Heterogeneous prostate transitional zone, especially near the apex. Correlation with serum PSA is recommended to determine the need for dedicated pelvic MRI as per prostate protocol. 4. Cholelithiasis without imaging signs of acute cholecystitis. 5. Multiple other nonacute observations, as described above. Electronically Signed   By: Ree Molt M.D.   On: 03/28/2024 18:53     .Critical Care  Performed by: Kaysey Berndt A, PA-C Authorized by: Gitty Osterlund A, PA-C   Critical care provider statement:    Critical care time (minutes):  40   Critical care start time:  03/28/2024 7:00 PM   Critical care end time:  03/28/2024 7:40 PM   Critical care time was exclusive of:  Separately billable procedures and treating other patients  Critical care was necessary to treat or prevent imminent or life-threatening deterioration of the following conditions:  Metabolic crisis   Critical care was time spent personally by me on the following activities:  Development of treatment plan with patient or surrogate, discussions with consultants, examination of patient, ordering and review of laboratory studies and re-evaluation of patient's condition   I assumed direction of critical care for this patient from another provider in my specialty: no     Care discussed with: admitting provider      Medications Ordered in the ED  0.9 %  sodium chloride infusion ( Intravenous New Bag/Given 03/28/24 2127)  sodium chloride flush (NS) 0.9 % injection 3 mL (has no administration in time range)  acetaminophen (TYLENOL) tablet 1,000 mg (has no administration in time range)  albuterol (PROVENTIL) (2.5 MG/3ML) 0.083% nebulizer solution 2.5 mg (has no administration  in time range)  melatonin tablet 6 mg (has no administration in time range)  ondansetron (ZOFRAN) injection 4 mg (has no administration in time range)  polyethylene glycol (MIRALAX / GLYCOLAX) packet 17 g (has no administration in time range)  oxyCODONE (Oxy IR/ROXICODONE) immediate release tablet 2.5 mg (has no administration in time range)    Or  oxyCODONE (Oxy IR/ROXICODONE) immediate release tablet 5 mg (has no administration in time range)  Zoledronic Acid (ZOMETA) IVPB 4 mg (has no administration in time range)  calcitonin (MIACALCIN) injection 280 Units (has no administration in time range)  carvedilol (COREG) tablet 6.25 mg (has no administration in time range)  rosuvastatin (CRESTOR) tablet 10 mg (has no administration in time range)  diazepam (VALIUM) tablet 2 mg (has no administration in time range)  pantoprazole (PROTONIX) EC tablet 40 mg (has no administration in time range)  famotidine (PEPCID) tablet 20 mg (has no administration in time range)  Chlorhexidine Gluconate Cloth 2 % PADS 6 each (6 each Topical Given 03/28/24 2113)  Oral care mouth rinse (has no administration in time range)  sodium chloride 0.9 % bolus 1,000 mL (0 mLs Intravenous Stopped 03/28/24 2128)                                    Medical Decision Making Amount and/or Complexity of Data Reviewed Labs: ordered.  Risk Decision regarding hospitalization.   This patient presents to the ED for concern of abnormal labs, this involves an extensive number of treatment options, and is a complaint that carries with it a high risk of complications and morbidity.  The differential diagnosis includes hypercalcemia, malignancy, dehydration, AKI   Co morbidities that complicate the patient evaluation  Hypertension, type 2 diabetes, liver cirrhosis secondary to [place]   Additional history obtained:  Additional history obtained from outpatient imaging including MRI of the chest, abdomen, and pelvis from  03/28/2024   Lab Tests:  I Ordered, and personally interpreted labs.  The pertinent results include: CBC unremarkable, CMP with mild evidence of dehydration with significant hypercalcemia with calcium greater than 15   Cardiac Monitoring: / EKG:  The patient was maintained on a cardiac monitor.  I personally viewed and interpreted the cardiac monitored which showed an underlying rhythm of: Sinus rhythm with slight prolong of the PR interval   Consultations Obtained:  I requested consultation with the hospitalist,  and discussed lab and imaging findings as well as pertinent plan - they recommend: Spoke with Dr. Segars, hospitalist, will be admitting patient.   Problem List /  ED Course / Critical interventions / Medication management  Patient presents emergency department with concerns of abnormal labs.  Reportedly, had labs performed prior to MR imaging today for assessment of abnormal CT and chest x-ray findings of chest.  He was called that his calcium level was greater than 15.  Denies any acute symptoms at this time such as urinary difficulty, significant constipation, increased pain or joint tenderness, or any cardiac abnormalities. On exam, patient is well-appearing.  No acute focal tenderness in the abdomen, chest or pelvis.  He does have some pain with movement of his upper back.  Will proceed with basic labs for evaluation of hypercalcemia if this result is accurate or not.  Based on imaging results seen from outpatient imaging, MRI is not currently available but CT imaging is concerning for possible lytic lesions.  This may be hypercalcemia of malignancy. CBC normal but CMP shows significant hypercalcemia at greater than 15.  Slight dehydration creatinine 1.28 and GFR 58.  Will consult hospitalist for admission.  Fluid bolus initiated. Spoke with Dr. Segars, hospitalist, who admitting patient. I ordered medication including fluids for hypercalcemia  Reevaluation of the patient after  these medicines showed that the patient stayed the same I have reviewed the patients home medicines and have made adjustments as needed   Social Determinants of Health:  None   Test / Admission - Considered:  Requiring inpatient admission.  Final diagnoses:  Hypercalcemia of malignancy    ED Discharge Orders     None          Cecily Legrand LABOR, PA-C 03/28/24 2135    Neysa Caron PARAS, DO 03/28/24 2332

## 2024-03-28 NOTE — ED Triage Notes (Signed)
 Pt reports that his provider told him that he had too much calcium in him. Pt was getting an MRI and had labs drawn.

## 2024-03-29 ENCOUNTER — Inpatient Hospital Stay (HOSPITAL_COMMUNITY)

## 2024-03-29 DIAGNOSIS — C801 Malignant (primary) neoplasm, unspecified: Secondary | ICD-10-CM

## 2024-03-29 DIAGNOSIS — C7951 Secondary malignant neoplasm of bone: Secondary | ICD-10-CM

## 2024-03-29 DIAGNOSIS — I10 Essential (primary) hypertension: Secondary | ICD-10-CM

## 2024-03-29 DIAGNOSIS — R634 Abnormal weight loss: Secondary | ICD-10-CM

## 2024-03-29 DIAGNOSIS — E119 Type 2 diabetes mellitus without complications: Secondary | ICD-10-CM

## 2024-03-29 LAB — CBC
HCT: 42.1 % (ref 39.0–52.0)
Hemoglobin: 13.3 g/dL (ref 13.0–17.0)
MCH: 32.8 pg (ref 26.0–34.0)
MCHC: 31.6 g/dL (ref 30.0–36.0)
MCV: 104 fL — ABNORMAL HIGH (ref 80.0–100.0)
Platelets: 222 K/uL (ref 150–400)
RBC: 4.05 MIL/uL — ABNORMAL LOW (ref 4.22–5.81)
RDW: 12.7 % (ref 11.5–15.5)
WBC: 7.2 K/uL (ref 4.0–10.5)
nRBC: 0 % (ref 0.0–0.2)

## 2024-03-29 LAB — BASIC METABOLIC PANEL WITH GFR
Anion gap: 7 (ref 5–15)
Anion gap: 9 (ref 5–15)
Anion gap: 7 (ref 5–15)
BUN: 26 mg/dL — ABNORMAL HIGH (ref 8–23)
BUN: 25 mg/dL — ABNORMAL HIGH (ref 8–23)
BUN: 27 mg/dL — ABNORMAL HIGH (ref 8–23)
CO2: 32 mmol/L (ref 22–32)
CO2: 29 mmol/L (ref 22–32)
CO2: 30 mmol/L (ref 22–32)
Calcium: 13.4 mg/dL (ref 8.9–10.3)
Calcium: 12 mg/dL — ABNORMAL HIGH (ref 8.9–10.3)
Calcium: 11.8 mg/dL — ABNORMAL HIGH (ref 8.9–10.3)
Chloride: 107 mmol/L (ref 98–111)
Chloride: 110 mmol/L (ref 98–111)
Chloride: 104 mmol/L (ref 98–111)
Creatinine, Ser: 1.13 mg/dL (ref 0.61–1.24)
Creatinine, Ser: 1.08 mg/dL (ref 0.61–1.24)
Creatinine, Ser: 1.08 mg/dL (ref 0.61–1.24)
GFR, Estimated: >60 mL/min (ref >60)
GFR, Estimated: >60 mL/min (ref >60)
GFR, Estimated: >60 mL/min (ref >60)
Glucose, Bld: 105 mg/dL — ABNORMAL HIGH (ref 70–99)
Glucose, Bld: 119 mg/dL — ABNORMAL HIGH (ref 70–99)
Glucose, Bld: 114 mg/dL — ABNORMAL HIGH (ref 70–99)
Potassium: 3.6 mmol/L (ref 3.5–5.1)
Potassium: 4.2 mmol/L (ref 3.5–5.1)
Potassium: 3 mmol/L — ABNORMAL LOW (ref 3.5–5.1)
Sodium: 146 mmol/L — ABNORMAL HIGH (ref 135–145)
Sodium: 148 mmol/L — ABNORMAL HIGH (ref 135–145)
Sodium: 141 mmol/L (ref 135–145)

## 2024-03-29 LAB — PHOSPHORUS: Phosphorus: 3.5 mg/dL (ref 2.5–4.6)

## 2024-03-29 LAB — FOLATE: Folate: 21.6 ng/mL (ref >5.9)

## 2024-03-29 LAB — TSH: TSH: 0.818 u[IU]/mL (ref 0.350–4.500)

## 2024-03-29 LAB — PSA: Prostatic Specific Antigen: 2.41 ng/mL (ref 0.00–4.00)

## 2024-03-29 LAB — MAGNESIUM: Magnesium: 2.1 mg/dL (ref 1.7–2.4)

## 2024-03-29 LAB — LACTATE DEHYDROGENASE: LDH: 130 U/L (ref 98–192)

## 2024-03-29 LAB — VITAMIN B12: Vitamin B-12: 1067 pg/mL — ABNORMAL HIGH (ref 180–914)

## 2024-03-29 LAB — SODIUM: Sodium: 146 mmol/L — ABNORMAL HIGH (ref 135–145)

## 2024-03-29 MED ORDER — HYDRALAZINE HCL 20 MG/ML IJ SOLN: 5.0000 mg | Freq: Four times a day (QID) | INTRAMUSCULAR | Status: DC | PRN

## 2024-03-29 MED ORDER — SODIUM CHLORIDE 0.9 % IV SOLN: INTRAVENOUS | Status: AC

## 2024-03-29 NOTE — Evaluation (Signed)
 Physical Therapy Evaluation Patient Details Name: Joe [name] MRN: [id number] DOB: [birth year] Today's Date: 03/29/2024  History of Present Illness  Joe [name] is a 73 y.o. 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 No  tumor encroachment upon the cervical spinal canal or neural  foramina. PMH: type 2 diabetes, hypertension, liver cirrhosis secondary to [facility]  Clinical Impression  Pt admitted with above diagnosis.  Pt currently with functional limitations due to the deficits listed below (see PT Problem List). Pt will benefit from acute skilled PT to increase their independence and safety with mobility to allow discharge.     The patient presents with mild imbalance when ambulating. Patient has a quad cane  but  generally held it up.  Patient ambulated x 220'. Patient reports independent, driving PTA. Patient's niece present and confirms family support at DC.  Recommend HHPT.      If plan is discharge home, recommend the following: A little help with walking and/or transfers;A little help with bathing/dressing/bathroom;Assistance with cooking/housework;Assist for transportation;Help with stairs or ramp for entrance   Can travel by private vehicle        Equipment Recommendations Rolling walker (2 wheels)  Recommendations for Other Services       Functional Status Assessment Patient has had a recent decline in their functional status and demonstrates the ability to make significant improvements in function in a reasonable and predictable amount of time.     Precautions / Restrictions Precautions Precautions: Fall Restrictions Weight Bearing Restrictions Per Provider Order: No      Mobility  Bed Mobility Overal bed mobility: Needs  Assistance Bed Mobility: Supine to Sit     Supine to sit: Supervision     General bed mobility comments: for safety    Transfers Overall transfer level: Needs assistance Equipment used: Quad cane Transfers: Sit to/from Stand Sit to Stand: Supervision                Ambulation/Gait Ambulation/Gait assistance: Min assist Gait Distance (Feet): 220 Feet Assistive device: Quad cane Gait Pattern/deviations: Step-through pattern, Drifts right/left Gait velocity: decr     General Gait Details: intermittent use of q cane, switched between hands several times, gait unsteady requiring  min support for balance intermittently  Stairs            Wheelchair Mobility     Tilt Bed    Modified Rankin (Stroke Patients Only)       Balance Overall balance assessment: Needs assistance Sitting-balance support: No upper extremity supported, Feet supported Sitting balance-Leahy Scale: Good     Standing balance support: During functional activity, No upper extremity supported Standing balance-Leahy Scale: Poor Standing balance comment: static good, dynamic poor                             Pertinent Vitals/Pain Pain Assessment Pain Assessment: Faces Faces Pain Scale: Hurts a little bit Pain Location: left lower quadrant Pain Descriptors / Indicators: Discomfort Pain Intervention(s): Monitored during session    Home Living Family/patient expects to be discharged to:: Private residence Living Arrangements: Children;Other relatives (neice) Available Help at Discharge: Family Type of Home: House Home Access: Ramped entrance     Alternate Level Stairs-Number of Steps: has  an Engineer, structural Home Layout: Two level Home Equipment: Cane - quad      Prior Function Prior Level of Function : Independent/Modified Independent;Driving                     Extremity/Trunk Assessment        Lower Extremity Assessment Lower Extremity Assessment: Generalized  weakness    Cervical / Trunk Assessment Cervical / Trunk Assessment: Normal  Communication   Communication Communication: No apparent difficulties    Cognition Arousal: Alert Behavior During Therapy: WFL for tasks assessed/performed   PT - Cognitive impairments: Orientation   Orientation impairments: Time, Place                   PT - Cognition Comments: initially stated [facility], then corrected to [facility]. Following commands: Intact       Cueing       General Comments      Exercises     Assessment/Plan    PT Assessment Patient needs continued PT services  PT Problem List Decreased activity tolerance;Decreased mobility;Decreased cognition;Decreased safety awareness;Decreased balance;Pain;Decreased knowledge of precautions       PT Treatment Interventions DME instruction;Therapeutic activities;Cognitive remediation;Gait training;Functional mobility training;Therapeutic exercise;Patient/family education    PT Goals (Current goals can be found in the Care Plan section)  Acute Rehab PT Goals Patient Stated Goal: go hhome PT Goal Formulation: With patient/family Time For Goal Achievement: 04/12/24 Potential to Achieve Goals: Good    Frequency Min 3X/week     Co-evaluation               AM-PAC PT 6 Clicks Mobility  Outcome Measure Help needed turning from your back to your side while in a flat bed without using bedrails?: None Help needed moving from lying on your back to sitting on the side of a flat bed without using bedrails?: None Help needed moving to and from a bed to a chair (including a wheelchair)?: A Little Help needed standing up from a chair using your arms (e.g., wheelchair or bedside chair)?: A Little Help needed to walk in hospital room?: A Little Help needed climbing 3-5 steps with a railing? : A Lot 6 Click Score: 19    End of Session Equipment Utilized During Treatment: Gait belt Activity Tolerance: Patient tolerated treatment  well Patient left: in chair;with call bell/phone within reach;with chair alarm set Nurse Communication: Mobility status PT Visit Diagnosis: Unsteadiness on feet (R26.81);Difficulty in walking, not elsewhere classified (R26.2)    Time: 8547-8496 PT Time Calculation (min) (ACUTE ONLY): 11 min   Charges:   PT Evaluation $PT Eval Low Complexity: 1 Low   PT General Charges $$ ACUTE PT VISIT: 1 Visit         Darice Potters PT Acute Rehabilitation Services Office [phone]   Potters Darice Norris 03/29/2024, 3:49 PM

## 2024-03-29 NOTE — Progress Notes (Signed)
 IR aware of biopsy request. Imaging reviewed and amenable to bone lesion biopsy. Will plan for Monday. IR team will see and discuss with pt.  Franky Rusk PA-C Interventional Radiology 03/29/2024 1:28 PM

## 2024-03-29 NOTE — Progress Notes (Addendum)
 PROGRESS NOTE    Joe [name]  FMW:[id number] DOB: 1951-01-30 DOA: 03/28/2024 PCP: Merilee Rush   Brief Narrative: Joe [name] is a 73 y.o. male with a history of diabetes mellitus type 2, hypertension, hyperlipidemia, NASH cirrhosis, neuropathy, GERD.  Patient presented secondary to abnormal labs and imaging with evidence of hypercalcemia and lytic bone lesions concerning for metastatic disease. Patient started on IV fluids and given Zometa  and calcitonin. Medical oncology consulted.   Assessment and Plan:  Hypercalcemia Presumed secondary to malignancy. Serum calcium  measured at >15 on admission. Ionized calcium  level not obtained; albumin within normal range. Patient started on IV fluids and calcitonin. Given one dose of zoledronic  acid. 25-vitamin D  normal. Calcium  down to 13.4 this morning -Decrease IV fluids rate secondary to developing hypernatremia -Continue calcitonin -Daily BMP -Follow-up PTH, PTHrP, SPEP  Mixed lytic lesions Noted incidentally. Patient with imaging evidence of extensive osseous metastasis; specifically, several rib lesions with associated soft tissue component. IR consulted for bone biopsy. -Medical oncology consultation: multiple myeloma workup -IR biopsy  AKI stage 2 Previous baseline creatinine of 0.71 from June 2025. Creatinine of 1.28 on admission. -Management with IV fluids  Heterogenous prostate Noted on imaging. PSA obtained and is 2.41.  Left hand paresthesia Extension to forearm. Patient also reports similar symptoms of right hand. Prior history of neuropathy. MRI cervical spine obtained  Hypernatremia Secondary to NS IV fluids. -Decrease IV fluid rate and recheck sodium  Diabetes mellitus type 2 Well controlled based on last hemoglobin A1C of 5.3% from June 2025. Patient is on Jardiance and Ozempic as an outpatient, which were held on admission. -Check hemoglobin A1C  Primary hypertension Patient is on Coreg  and losartan as an  outpatient. -Continue Coreg  and losartan  Hyperlipidemia -Continue Crestor   NASH cirrhosis Noted. Compensated.  Neuropathy Noted. Not on outpatient medication management.  GERD -Continue famotidine  and Protonix  (substituted for outpatient Nexium)  Anxiety -Continue diazepam   Left foot drop -Follow-up cervical spine MRI    DVT prophylaxis: SCDs Code Status:   Code Status: Limited: Do not attempt resuscitation (DNR) -DNR-LIMITED -Do Not Intubate/DNI  Family Communication: Niece at bedside Disposition Plan: Discharge likely home once calcium  improved and stable, in addition to specialist recommendations   Consultants:  Medical oncology  Procedures:  None  Antimicrobials: None    Subjective: Patient reports no specific concerns today.  Objective: BP (!) 158/70   Pulse 63   Temp 98.5 F (36.9 C) (Oral)   Resp 16   Ht 5' 7 (1.702 m)   Wt 72.7 kg   SpO2 97%   BMI 25.10 kg/m   Examination:  General exam: Appears calm and comfortable Respiratory system: Clear to auscultation. Respiratory effort normal. Cardiovascular system: S1 & S2 heard, RRR. No murmurs, rubs, gallops or clicks. Gastrointestinal system: Abdomen is nondistended, soft and nontender. Normal bowel sounds heard. Central nervous system: Alert and oriented. Left foot drop. 5/5 strength. 0 left achilles reflex, compared to 2+ on right. 2+ bilateral patellar reflexes Psychiatry: Judgement and insight appear normal. Mood & affect appropriate.    Data Reviewed: I have personally reviewed following labs and imaging studies  CBC Lab Results  Component Value Date   WBC 7.2 03/29/2024   RBC 4.05 (L) 03/29/2024   HGB 13.3 03/29/2024   HCT 42.1 03/29/2024   MCV 104.0 (H) 03/29/2024   MCH 32.8 03/29/2024   PLT 222 03/29/2024   MCHC 31.6 03/29/2024   RDW 12.7 03/29/2024   LYMPHSABS 2.9 03/28/2024   MONOABS  0.6 03/28/2024   EOSABS 0.2 03/28/2024   BASOSABS 0.1 03/28/2024     Last metabolic  panel Lab Results  Component Value Date   NA 146 (H) 03/29/2024   K 3.6 03/29/2024   CL 107 03/29/2024   CO2 32 03/29/2024   BUN 26 (H) 03/29/2024   CREATININE 1.13 03/29/2024   GLUCOSE 105 (H) 03/29/2024   GFRNONAA >60 03/29/2024   CALCIUM  13.4 (HH) 03/29/2024   PHOS 3.5 03/29/2024   PROT 8.3 (H) 03/28/2024   ALBUMIN 3.5 03/28/2024   BILITOT 1.0 03/28/2024   ALKPHOS 125 03/28/2024   AST 27 03/28/2024   ALT 13 03/28/2024   ANIONGAP 7 03/29/2024    GFR: Estimated Creatinine Clearance: 52.8 mL/min (by C-G formula based on SCr of 1.13 mg/dL).  Recent Results (from the past 240 hours)  MRSA Next Gen by PCR, Nasal     Status: None   Collection Time: 03/28/24  5:43 PM   Specimen: Nasal Mucosa; Nasal Swab  Result Value Ref Range Status   MRSA by PCR Next Gen NOT DETECTED NOT DETECTED Final    Comment: (NOTE) The GeneXpert MRSA Assay (FDA approved for NASAL specimens only), is one component of a comprehensive MRSA colonization surveillance program. It is not intended to diagnose MRSA infection nor to guide or monitor treatment for MRSA infections. Test performance is not FDA approved in patients less than 94 years old. Performed at [facility], [address] [place], [place] [address]       Radiology Studies: MR ABDOMEN WWO CONTRAST Result Date: 03/28/2024 CLINICAL DATA:  New diagnosis of multiple bone metastases. Screening for primary neoplastic process. EXAM: MRI ABDOMEN AND PELVIS WITHOUT AND WITH CONTRAST TECHNIQUE: Multiplanar multisequence MR imaging of the abdomen and pelvis was performed both before and after the administration of intravenous contrast. CONTRAST:  7mL GADAVIST  GADOBUTROL  1 MMOL/ML IV SOLN COMPARISON:  None Available. FINDINGS: COMBINED FINDINGS FOR BOTH MR ABDOMEN AND PELVIS Lower chest: Unremarkable MR appearance to the lung bases. No pleural effusion. No pericardial effusion. Normal heart size. Hepatobiliary: The liver is normal in  size and configuration. No intrahepatic or extrahepatic bile duct dilatation. No choledocholithiasis. Small volume dependent gallstones noted without imaging signs of acute cholecystitis. Pancreas: No mass, inflammatory changes or other parenchymal abnormality identified. No main pancreatic duct dilation. Spleen:  Within normal limits in size and appearance. No focal mass. Adrenals/Urinary Tract: Unremarkable adrenal glands. No hydroureteronephrosis. Simple cyst noted in the right kidney lower pole measuring 1.5 x 1.6 cm. There is a smaller simple cyst in the left kidney upper pole, laterally. Stomach/Bowel: Visualized portions within the abdomen are unremarkable. No disproportionate dilation of bowel loops. Multiple colonic diverticula noted without diverticulitis. Vascular/Lymphatic: No pathologically enlarged lymph nodes identified. No abdominal aortic aneurysm demonstrated. No ascites. Reproductive: The prostate is not well evaluated on this exam. However, there is heterogeneous transitional zone, especially near the apex. Correlation with serum PSA is recommended to determine the need for dedicated pelvic MRI as per prostate protocol. Other:  None. Musculoskeletal: There are extensive mixed T2 hypointense and hyperintense lesions throughout the imaged bones compatible with metastases. There are several rib lesions with associated soft tissue component, as seen on the coronal images, better evaluated on the recent chest CT scan. IMPRESSION: 1. Extensive osseous metastases. There are several rib lesions with associated soft tissue component, better evaluated on the recent chest CT scan. 2. No primary neoplastic process identified in the abdomen or pelvis. 3. Heterogeneous prostate transitional  zone, especially near the apex. Correlation with serum PSA is recommended to determine the need for dedicated pelvic MRI as per prostate protocol. 4. Cholelithiasis without imaging signs of acute cholecystitis. 5. Multiple  other nonacute observations, as described above. Electronically Signed   By: Ree Molt M.D.   On: 03/28/2024 18:53   MR PELVIS W WO CONTRAST Result Date: 03/28/2024 CLINICAL DATA:  New diagnosis of multiple bone metastases. Screening for primary neoplastic process. EXAM: MRI ABDOMEN AND PELVIS WITHOUT AND WITH CONTRAST TECHNIQUE: Multiplanar multisequence MR imaging of the abdomen and pelvis was performed both before and after the administration of intravenous contrast. CONTRAST:  7mL GADAVIST  GADOBUTROL  1 MMOL/ML IV SOLN COMPARISON:  None Available. FINDINGS: COMBINED FINDINGS FOR BOTH MR ABDOMEN AND PELVIS Lower chest: Unremarkable MR appearance to the lung bases. No pleural effusion. No pericardial effusion. Normal heart size. Hepatobiliary: The liver is normal in size and configuration. No intrahepatic or extrahepatic bile duct dilatation. No choledocholithiasis. Small volume dependent gallstones noted without imaging signs of acute cholecystitis. Pancreas: No mass, inflammatory changes or other parenchymal abnormality identified. No main pancreatic duct dilation. Spleen:  Within normal limits in size and appearance. No focal mass. Adrenals/Urinary Tract: Unremarkable adrenal glands. No hydroureteronephrosis. Simple cyst noted in the right kidney lower pole measuring 1.5 x 1.6 cm. There is a smaller simple cyst in the left kidney upper pole, laterally. Stomach/Bowel: Visualized portions within the abdomen are unremarkable. No disproportionate dilation of bowel loops. Multiple colonic diverticula noted without diverticulitis. Vascular/Lymphatic: No pathologically enlarged lymph nodes identified. No abdominal aortic aneurysm demonstrated. No ascites. Reproductive: The prostate is not well evaluated on this exam. However, there is heterogeneous transitional zone, especially near the apex. Correlation with serum PSA is recommended to determine the need for dedicated pelvic MRI as per prostate protocol. Other:   None. Musculoskeletal: There are extensive mixed T2 hypointense and hyperintense lesions throughout the imaged bones compatible with metastases. There are several rib lesions with associated soft tissue component, as seen on the coronal images, better evaluated on the recent chest CT scan. IMPRESSION: 1. Extensive osseous metastases. There are several rib lesions with associated soft tissue component, better evaluated on the recent chest CT scan. 2. No primary neoplastic process identified in the abdomen or pelvis. 3. Heterogeneous prostate transitional zone, especially near the apex. Correlation with serum PSA is recommended to determine the need for dedicated pelvic MRI as per prostate protocol. 4. Cholelithiasis without imaging signs of acute cholecystitis. 5. Multiple other nonacute observations, as described above. Electronically Signed   By: Ree Molt M.D.   On: 03/28/2024 18:53      LOS: 1 day    Elgin Lam, MD [facility] 03/29/2024, 7:34 AM   If 7PM-7AM, please contact night-coverage [email or web address]

## 2024-03-29 NOTE — Plan of Care (Signed)
  Problem: Health Behavior/Discharge Planning: Goal: Ability to manage health-related needs will improve Outcome: Progressing   Problem: Clinical Measurements: Goal: Will remain free from infection Outcome: Progressing Goal: Respiratory complications will improve Outcome: Progressing Goal: Cardiovascular complication will be avoided Outcome: Progressing   Problem: Nutrition: Goal: Adequate nutrition will be maintained Outcome: Progressing   Problem: Coping: Goal: Level of anxiety will decrease Outcome: Progressing   Problem: Elimination: Goal: Will not experience complications related to urinary retention Outcome: Progressing   Problem: Pain Managment: Goal: General experience of comfort will improve and/or be controlled Outcome: Progressing   Problem: Safety: Goal: Ability to remain free from injury will improve Outcome: Progressing   Problem: Skin Integrity: Goal: Risk for impaired skin integrity will decrease Outcome: Progressing

## 2024-03-29 NOTE — TOC Initial Note (Signed)
 Transition of Care [facility]) - Initial/Assessment Note    Patient Details  Name: Joe [name] MRN: [id number] Date of Birth: 08/10/1951  [facility] CM/SW Contact:    Sheri ONEIDA Sharps, LCSW Phone Number: 03/29/2024, 1:28 PM  Clinical Narrative:                 Pt from home w/ adult children. Pt continues medical workup. TOC following for dc needs.    Barriers to Discharge: Continued Medical Work up   Patient Goals and CMS Choice Patient states their goals for this hospitalization and ongoing recovery are:: return home   Choice offered to / list presented to : NA      Expected Discharge Plan and Services In-house Referral: NA Discharge Planning Services: NA   Living arrangements for the past 2 months: Single Family Home                 DME Arranged: N/A DME Agency: NA       HH Arranged: NA HH Agency: NA        Prior Living Arrangements/Services Living arrangements for the past 2 months: Single Family Home Lives with:: Adult Children Patient language and need for interpreter reviewed:: Yes Do you feel safe going back to the place where you live?: Yes      Need for Family Participation in Patient Care: Yes (Comment) Care giver support system in place?: Yes (comment)   Criminal Activity/Legal Involvement Pertinent to Current Situation/Hospitalization: No - Comment as needed  Activities of Daily Living   ADL Screening (condition at time of admission) Independently performs ADLs?: Yes (appropriate for developmental age) Is the patient deaf or have difficulty hearing?: No Does the patient have difficulty seeing, even when wearing glasses/contacts?: No Does the patient have difficulty concentrating, remembering, or making decisions?: No  Permission Sought/Granted                  Emotional Assessment Appearance:: Appears stated age Attitude/Demeanor/Rapport: Engaged Affect (typically observed): Accepting Orientation: : Oriented to Situation,  Oriented to  Time, Oriented to Place, Oriented to Self Alcohol / Substance Use: Not Applicable Psych Involvement: No (comment)  Admission diagnosis:  Hypercalcemia of malignancy [E83.52] Patient Active Problem List   Diagnosis Date Noted   Hypercalcemia of malignancy 03/28/2024   Lytic lesion of bone on x-ray 03/28/2024   Cancer related pain 03/28/2024   Squamous cell carcinoma of scalp 12/27/2023   Type 2 diabetes mellitus with peripheral neuropathy (HCC) 02/04/2023   Liver cirrhosis secondary to NASH (HCC) 10/03/2022   Class 2 severe obesity with serious comorbidity and body mass index (BMI) of 37.0 to 37.9 in adult [facility]) 10/02/2022   Hepatic steatosis 09/04/2019   Encounter for long-term (current) use of medications 11/02/2016   Hypertension, essential 11/02/2016   Type 2 diabetes mellitus with hyperglycemia, without long-term current use of insulin (HCC) 11/02/2016   Type 2 diabetes mellitus without complication, without long-term current use of insulin (HCC) 11/02/2016   PCP:  Merilee Rush Pharmacy:   [facility] [phone] [address], [place] - [address] [place] [address] Phone: [phone] Fax: [phone]     Social Drivers of Health (SDOH) Social History: SDOH Screenings   Food Insecurity: No Food Insecurity (03/28/2024)  Housing: Low Risk  (03/28/2024)  Transportation Needs: No Transportation Needs (03/28/2024)  Utilities: Not At Risk (03/28/2024)  Social Connections: Socially Isolated (03/28/2024)  Tobacco Use: Unknown (03/28/2024)   SDOH Interventions:  Readmission Risk Interventions    03/29/2024    1:27 PM  Readmission Risk Prevention Plan  Post Dischage Appt Complete  Medication Screening Complete  Transportation Screening Complete

## 2024-03-29 NOTE — Plan of Care (Signed)
  Problem: Clinical Measurements: Goal: Diagnostic test results will improve Outcome: Not Progressing   Problem: Nutrition: Goal: Adequate nutrition will be maintained Outcome: Not Progressing   Problem: Elimination: Goal: Will not experience complications related to bowel motility Outcome: Not Progressing Goal: Will not experience complications related to urinary retention Outcome: Not Progressing   Problem: Pain Managment: Goal: General experience of comfort will improve and/or be controlled Outcome: Not Progressing

## 2024-03-29 NOTE — Hospital Course (Signed)
 Joe [name] is a 73 y.o. male with a history of diabetes mellitus type 2, hypertension, hyperlipidemia, NASH cirrhosis, neuropathy, GERD.  Patient presented secondary to abnormal labs and imaging with evidence of hypercalcemia and lytic bone lesions concerning for metastatic disease. Patient started on IV fluids and given Zometa  and calcitonin. Medical oncology consulted.

## 2024-03-29 NOTE — Consult Note (Signed)
 [facility]  [phone]   HEMATOLOGY ONCOLOGY INPATIENT CONSULTATION   Joe [name]  DOB: 04-19-1951  MR#: [id number]  CSN#: [id number]    Requesting Physician: [facility]  Patient Care Team: Merilee Rush as PCP - General (Family Medicine)  Reason for consult: diffuse bone lesions concerning for malignancy  History of present illness:   This is a 73 year old gentleman with past medical history of hypertension, diabetes, [name] liver cirrhosis, presents with intermittent chest and low back pain for the past month, and significant weight loss.  He reports frequently urination, especially at night, and constipation with increased frequency of bowel movement.  He denies any hematochezia, abdominal pain or nausea.  He was referred to emergency room by his primary care physician due to significant hypercalcemia and diffuse bone lesions.  He was initially referred to our cancer center last week and is scheduled to see our PA next week.  I was called to evaluate him for presumed metastatic malignancy.  Abdominal and pelvic MRI showed extensive ostial metastasis, no primary tumor identified on CT and MRI.  There is a heterogeneous prostate transition zone, PSA was normal at 2.1.  MEDICAL HISTORY:  Past Medical History:  Diagnosis Date   Anxiety    Diabetes mellitus without complication (HCC)     SURGICAL HISTORY: History reviewed. No pertinent surgical history.  SOCIAL HISTORY: Social History   Socioeconomic History   Marital status: Married    Spouse name: Not on file   Number of children: Not on file   Years of education: Not on file   Highest education level: Not on file  Occupational History   Not on file  Tobacco Use   Smoking status: Never   Smokeless tobacco: Not on file  Substance and Sexual Activity   Alcohol use: No    Alcohol/week: 0.0 standard drinks of alcohol   Drug use: No   Sexual activity: Not on file  Other Topics Concern    Not on file  Social History Narrative   Not on file   Social Drivers of Health   Financial Resource Strain: Not on file  Food Insecurity: No Food Insecurity (03/28/2024)   Hunger Vital Sign    Worried About Running Out of Food in the Last Year: Never true    Ran Out of Food in the Last Year: Never true  Transportation Needs: No Transportation Needs (03/28/2024)   PRAPARE - Administrator, Civil Service (Medical): No    Lack of Transportation (Non-Medical): No  Physical Activity: Not on file  Stress: Not on file  Social Connections: Socially Isolated (03/28/2024)   Social Connection and Isolation Panel    Frequency of Communication with Friends and Family: More than three times a week    Frequency of Social Gatherings with Friends and Family: More than three times a week    Attends Religious Services: Never    Database administrator or Organizations: No    Attends Banker Meetings: Patient unable to answer    Marital Status: Widowed  Intimate Partner Violence: Not At Risk (03/28/2024)   Humiliation, Afraid, Rape, and Kick questionnaire    Fear of Current or Ex-Partner: No    Emotionally Abused: No    Physically Abused: No    Sexually Abused: No    FAMILY HISTORY: History reviewed. No pertinent family history.  ALLERGIES:  has no known allergies.  MEDICATIONS:  Current Facility-Administered Medications  Medication Dose Route Frequency Provider  Last Rate Last Admin   0.9 %  sodium chloride  infusion   Intravenous Continuous Briana Elgin LABOR, MD 100 mL/hr at 03/29/24 0843 Rate Change at 03/29/24 0843   acetaminophen  (TYLENOL ) tablet 1,000 mg  1,000 mg Oral Q6H PRN Keturah Carrier, MD       albuterol  (PROVENTIL ) (2.5 MG/3ML) 0.083% nebulizer solution 2.5 mg  2.5 mg Nebulization Q4H PRN Keturah Carrier, MD       carvedilol  (COREG ) tablet 6.25 mg  6.25 mg Oral BID Segars, Jonathan, MD   6.25 mg at 03/29/24 9047   Chlorhexidine  Gluconate Cloth 2 % PADS 6 each   6 each Topical QHS Keturah Carrier, MD   6 each at 03/28/24 2113   diazepam  (VALIUM ) tablet 2 mg  2 mg Oral BID PRN Segars, Jonathan, MD       famotidine  (PEPCID ) tablet 20 mg  20 mg Oral Daily Segars, Jonathan, MD   20 mg at 03/29/24 9047   hydrALAZINE  (APRESOLINE ) injection 5 mg  5 mg Intravenous Q6H PRN Chavez, Abigail, NP       HYDROmorphone  (DILAUDID ) injection 0.5 mg  0.5 mg Intravenous Q4H PRN Keturah Carrier, MD   0.5 mg at 03/29/24 1001   melatonin tablet 6 mg  6 mg Oral QHS PRN Keturah Carrier, MD       ondansetron  (ZOFRAN ) injection 4 mg  4 mg Intravenous Q6H PRN Keturah Carrier, MD       Oral care mouth rinse  15 mL Mouth Rinse PRN Segars, Carrier, MD       oxyCODONE  (Oxy IR/ROXICODONE ) immediate release tablet 2.5 mg  2.5 mg Oral Q4H PRN Keturah Carrier, MD       Or   oxyCODONE  (Oxy IR/ROXICODONE ) immediate release tablet 5 mg  5 mg Oral Q4H PRN Keturah Carrier, MD       pantoprazole  (PROTONIX ) EC tablet 40 mg  40 mg Oral Daily Segars, Jonathan, MD   40 mg at 03/29/24 9046   polyethylene glycol (MIRALAX  / GLYCOLAX ) packet 17 g  17 g Oral Daily PRN Keturah Carrier, MD       rosuvastatin  (CRESTOR ) tablet 10 mg  10 mg Oral Daily Segars, Jonathan, MD   10 mg at 03/29/24 9046   sodium chloride  flush (NS) 0.9 % injection 3 mL  3 mL Intravenous Q12H Keturah Carrier, MD   3 mL at 03/29/24 1002    REVIEW OF SYSTEMS:   Constitutional: Denies fevers, chills or abnormal night sweats, (+) weight loss  Eyes: Denies blurriness of vision, double vision or watery eyes Ears, nose, mouth, throat, and face: Denies mucositis or sore throat Respiratory: Denies cough, dyspnea or wheezes Cardiovascular: Denies palpitation, (+) chest pain, no lower extremity swelling Gastrointestinal:  Denies nausea, heartburn or change in bowel habits Skin: Denies abnormal skin rashes Lymphatics: Denies new lymphadenopathy or easy bruising Neurological:Denies numbness, tingling or new  weaknesses Behavioral/Psych: Mood is stable, no new changes  All other systems were reviewed with the patient and are negative.  PHYSICAL EXAMINATION: ECOG PERFORMANCE STATUS: 1 - Symptomatic but completely ambulatory  Vitals:   03/29/24 1200 03/29/24 1350  BP: 134/89   Pulse: (!) 59   Resp: 17   Temp:  98.2 F (36.8 C)  SpO2: 99%    Filed Weights   03/28/24 1747 03/28/24 2121 03/29/24 0400  Weight: 156 lb (70.8 kg) 160 lb 4.4 oz (72.7 kg) 160 lb 4.4 oz (72.7 kg)    GENERAL:alert, no distress and comfortable SKIN: skin color, texture, turgor  are normal, no rashes or significant lesions EYES: normal, conjunctiva are pink and non-injected, sclera clear OROPHARYNX:no exudate, no erythema and lips, buccal mucosa, and tongue normal  NECK: supple, thyroid  normal size, non-tender, without nodularity LYMPH:  no palpable lymphadenopathy in the cervical, axillary or inguinal LUNGS: clear to auscultation and percussion with normal breathing effort HEART: regular rate & rhythm and no murmurs and no lower extremity edema ABDOMEN:abdomen soft, non-tender and normal bowel sounds Musculoskeletal:no cyanosis of digits and no clubbing  PSYCH: alert & oriented x 3 with fluent speech NEURO: no focal motor/sensory deficits  LABORATORY DATA:  I have reviewed the data as listed Lab Results  Component Value Date   WBC 7.2 03/29/2024   HGB 13.3 03/29/2024   HCT 42.1 03/29/2024   MCV 104.0 (H) 03/29/2024   PLT 222 03/29/2024   Recent Labs    03/28/24 1820 03/28/24 2119 03/29/24 0242 03/29/24 0749 03/29/24 1123  NA 141  --  146* 148* 146*  K 4.0  --  3.6 4.2  --   CL 100  --  107 110  --   CO2 28  --  32 29  --   GLUCOSE 98  --  105* 119*  --   BUN 29*  --  26* 25*  --   CREATININE 1.28*  --  1.13 1.08  --   CALCIUM  >15.0* 15.0* 13.4* 12.0*  --   GFRNONAA 58*  --  >60 >60  --   PROT 8.3*  --   --   --   --   ALBUMIN 3.5  --   --   --   --   AST 27  --   --   --   --   ALT 13  --    --   --   --   ALKPHOS 125  --   --   --   --   BILITOT 1.0  --   --   --   --     RADIOGRAPHIC STUDIES: I have personally reviewed the radiological images as listed and agreed with the findings in the report. MR ABDOMEN WWO CONTRAST Result Date: 03/28/2024 CLINICAL DATA:  New diagnosis of multiple bone metastases. Screening for primary neoplastic process. EXAM: MRI ABDOMEN AND PELVIS WITHOUT AND WITH CONTRAST TECHNIQUE: Multiplanar multisequence MR imaging of the abdomen and pelvis was performed both before and after the administration of intravenous contrast. CONTRAST:  7mL GADAVIST  GADOBUTROL  1 MMOL/ML IV SOLN COMPARISON:  None Available. FINDINGS: COMBINED FINDINGS FOR BOTH MR ABDOMEN AND PELVIS Lower chest: Unremarkable MR appearance to the lung bases. No pleural effusion. No pericardial effusion. Normal heart size. Hepatobiliary: The liver is normal in size and configuration. No intrahepatic or extrahepatic bile duct dilatation. No choledocholithiasis. Small volume dependent gallstones noted without imaging signs of acute cholecystitis. Pancreas: No mass, inflammatory changes or other parenchymal abnormality identified. No main pancreatic duct dilation. Spleen:  Within normal limits in size and appearance. No focal mass. Adrenals/Urinary Tract: Unremarkable adrenal glands. No hydroureteronephrosis. Simple cyst noted in the right kidney lower pole measuring 1.5 x 1.6 cm. There is a smaller simple cyst in the left kidney upper pole, laterally. Stomach/Bowel: Visualized portions within the abdomen are unremarkable. No disproportionate dilation of bowel loops. Multiple colonic diverticula noted without diverticulitis. Vascular/Lymphatic: No pathologically enlarged lymph nodes identified. No abdominal aortic aneurysm demonstrated. No ascites. Reproductive: The prostate is not well evaluated on this exam. However, there is heterogeneous transitional zone, especially near  the apex. Correlation with serum PSA  is recommended to determine the need for dedicated pelvic MRI as per prostate protocol. Other:  None. Musculoskeletal: There are extensive mixed T2 hypointense and hyperintense lesions throughout the imaged bones compatible with metastases. There are several rib lesions with associated soft tissue component, as seen on the coronal images, better evaluated on the recent chest CT scan. IMPRESSION: 1. Extensive osseous metastases. There are several rib lesions with associated soft tissue component, better evaluated on the recent chest CT scan. 2. No primary neoplastic process identified in the abdomen or pelvis. 3. Heterogeneous prostate transitional zone, especially near the apex. Correlation with serum PSA is recommended to determine the need for dedicated pelvic MRI as per prostate protocol. 4. Cholelithiasis without imaging signs of acute cholecystitis. 5. Multiple other nonacute observations, as described above. Electronically Signed   By: Ree Molt M.D.   On: 03/28/2024 18:53   MR PELVIS W WO CONTRAST Result Date: 03/28/2024 CLINICAL DATA:  New diagnosis of multiple bone metastases. Screening for primary neoplastic process. EXAM: MRI ABDOMEN AND PELVIS WITHOUT AND WITH CONTRAST TECHNIQUE: Multiplanar multisequence MR imaging of the abdomen and pelvis was performed both before and after the administration of intravenous contrast. CONTRAST:  7mL GADAVIST  GADOBUTROL  1 MMOL/ML IV SOLN COMPARISON:  None Available. FINDINGS: COMBINED FINDINGS FOR BOTH MR ABDOMEN AND PELVIS Lower chest: Unremarkable MR appearance to the lung bases. No pleural effusion. No pericardial effusion. Normal heart size. Hepatobiliary: The liver is normal in size and configuration. No intrahepatic or extrahepatic bile duct dilatation. No choledocholithiasis. Small volume dependent gallstones noted without imaging signs of acute cholecystitis. Pancreas: No mass, inflammatory changes or other parenchymal abnormality identified. No main  pancreatic duct dilation. Spleen:  Within normal limits in size and appearance. No focal mass. Adrenals/Urinary Tract: Unremarkable adrenal glands. No hydroureteronephrosis. Simple cyst noted in the right kidney lower pole measuring 1.5 x 1.6 cm. There is a smaller simple cyst in the left kidney upper pole, laterally. Stomach/Bowel: Visualized portions within the abdomen are unremarkable. No disproportionate dilation of bowel loops. Multiple colonic diverticula noted without diverticulitis. Vascular/Lymphatic: No pathologically enlarged lymph nodes identified. No abdominal aortic aneurysm demonstrated. No ascites. Reproductive: The prostate is not well evaluated on this exam. However, there is heterogeneous transitional zone, especially near the apex. Correlation with serum PSA is recommended to determine the need for dedicated pelvic MRI as per prostate protocol. Other:  None. Musculoskeletal: There are extensive mixed T2 hypointense and hyperintense lesions throughout the imaged bones compatible with metastases. There are several rib lesions with associated soft tissue component, as seen on the coronal images, better evaluated on the recent chest CT scan. IMPRESSION: 1. Extensive osseous metastases. There are several rib lesions with associated soft tissue component, better evaluated on the recent chest CT scan. 2. No primary neoplastic process identified in the abdomen or pelvis. 3. Heterogeneous prostate transitional zone, especially near the apex. Correlation with serum PSA is recommended to determine the need for dedicated pelvic MRI as per prostate protocol. 4. Cholelithiasis without imaging signs of acute cholecystitis. 5. Multiple other nonacute observations, as described above. Electronically Signed   By: Ree Molt M.D.   On: 03/28/2024 18:53   CT CHEST W CONTRAST Result Date: 03/26/2024 CLINICAL DATA:  Abnormal chest x-ray. Pain in left side of lungs. * Tracking Code: BO * EXAM: CT CHEST WITH  CONTRAST TECHNIQUE: Multidetector CT imaging of the chest was performed during intravenous contrast administration. RADIATION DOSE REDUCTION: This exam was performed  according to the departmental dose-optimization program which includes automated exposure control, adjustment of the mA and/or kV according to patient size and/or use of iterative reconstruction technique. CONTRAST:  ISOVUE -300 IOPAMIDOL  (ISOVUE -300) INJECTION 61% COMPARISON:  None Available. FINDINGS: Cardiovascular: Normal cardiac size. No pericardial effusion. No aortic aneurysm. There are coronary artery calcifications, in keeping with coronary artery disease. There are also mild peripheral atherosclerotic vascular calcifications of thoracic aorta and its major branches. Mediastinum/Nodes: Visualized thyroid  gland appears grossly unremarkable. No solid / cystic mediastinal masses. The esophagus is nondistended precluding optimal assessment. No axillary, mediastinal or hilar lymphadenopathy by size criteria. Lungs/Pleura: The central tracheo-bronchial tree is patent. No mass or consolidation. No pleural effusion or pneumothorax. No suspicious lung nodules. Upper Abdomen: Small volume calcified gallstones noted without imaging signs of acute cholecystitis. Remaining visualized upper abdominal viscera within normal limits. Musculoskeletal: The visualized soft tissues of the chest wall are grossly unremarkable. There are extensive lytic lesions throughout the imaged bones, compatible with metastases. There is also associated destruction as well as expansile lytic lesions with soft tissue in several ribs including largest lesion along the anterolateral right fourth rib with soft tissue measuring up to 2.0 x 2.9 cm. There are mild multilevel degenerative changes in the visualized spine. IMPRESSION: 1. There are extensive lytic lesions throughout the imaged bones, compatible with metastases. There is also associated destruction as well as expansile  lytic lesions with soft tissue in several ribs including largest lesion along the anterolateral right fourth rib with soft tissue measuring up to 2.0 x 2.9 cm. 2. No lung mass, consolidation, pleural effusion or pneumothorax. 3. Multiple other nonacute observations, as described above. Aortic Atherosclerosis (ICD10-I70.0). Electronically Signed   By: Ree Molt M.D.   On: 03/26/2024 16:32   DG Chest 2 View Result Date: 03/25/2024 CLINICAL DATA:  Chest pain for 3 weeks. EXAM: CHEST - 2 VIEW COMPARISON:  None Available. FINDINGS: The heart size and mediastinal contours are within normal limits. There appears to be a pleural based mass in the right midlung. There is also the suggestion of possible lytic lesion involving the right eighth rib laterally as well as possible other lytic lesions involving the right ribs and possibly scapula. No definite consolidative process is noted. IMPRESSION: Probable pleural based mass seen in right midlung with possible lytic lesions involving multiple right ribs and right scapula. CT scan of the chest is recommended for further evaluation. These results will be called to the ordering clinician or representative by the Radiologist Assistant, and communication documented in the PACS or zVision Dashboard. Electronically Signed   By: Lynwood Landy Raddle M.D.   On: 03/25/2024 17:03    ASSESSMENT & PLAN:  73 year old gentleman with past medical history of hypertension, diabetes, presented with hypercalcemia and diffuse bone metastasis.  Malignant hypercalcemia Diffuse bone metastasis, with unknown primary Hypertension Diabetes Weight loss  Recommendations: - Patient has received Zometa  and calcitonin, on IV fluids, calcium  level will likely coming down in the next few days -I personally reviewed his CT and MRI images, multiple bone lesions are concerning for malignancy.  He does have frequent urinary symptoms, this is concerning for metastatic prostate cancer, however his  PSA is not elevated. - The other differentiation is multiple myeloma.  He does have slightly elevated total protein, I will order multiple myeloma panel and light chain level, and 24 urine UPEP -I agree with bone biopsy, IR plan to do it on Monday. - I will follow-up next Monday  All questions were  answered. The patient knows to call the clinic with any problems, questions or concerns.      Onita Mattock, MD 03/29/2024 2:04 PM

## 2024-03-30 ENCOUNTER — Encounter (HOSPITAL_COMMUNITY): Payer: Self-pay | Admitting: Internal Medicine

## 2024-03-30 LAB — BASIC METABOLIC PANEL WITH GFR
Anion gap: 8 (ref 5–15)
BUN: 26 mg/dL — ABNORMAL HIGH (ref 8–23)
CO2: 28 mmol/L (ref 22–32)
Calcium: 11.1 mg/dL — ABNORMAL HIGH (ref 8.9–10.3)
Chloride: 109 mmol/L (ref 98–111)
Creatinine, Ser: 1.1 mg/dL (ref 0.61–1.24)
GFR, Estimated: >60 mL/min (ref >60)
Glucose, Bld: 99 mg/dL (ref 70–99)
Potassium: 3.8 mmol/L (ref 3.5–5.1)
Sodium: 145 mmol/L (ref 135–145)

## 2024-03-30 LAB — PROTIME-INR
INR: 1.3 — ABNORMAL HIGH (ref 0.8–1.2)
Prothrombin Time: 16.5 s — ABNORMAL HIGH (ref 11.4–15.2)

## 2024-03-30 LAB — CALCIUM, IONIZED: Calcium, Ionized, Serum: 7.9 mg/dL — ABNORMAL HIGH (ref 4.5–5.6)

## 2024-03-30 LAB — GLUCOSE, CAPILLARY
Glucose-Capillary: 117 mg/dL — ABNORMAL HIGH (ref 70–99)
Glucose-Capillary: 87 mg/dL (ref 70–99)
Glucose-Capillary: 98 mg/dL (ref 70–99)

## 2024-03-30 MED ORDER — POLYETHYLENE GLYCOL 3350 17 G PO PACK
17.0000 g | PACK | Freq: Two times a day (BID) | ORAL | Status: DC
  Administered 2024-03-30: 17 g via ORAL
  Filled 2024-03-30 (×2): qty 1

## 2024-03-30 MED ORDER — SENNOSIDES-DOCUSATE SODIUM 8.6-50 MG PO TABS
1.0000 | ORAL_TABLET | Freq: Two times a day (BID) | ORAL | Status: DC
  Administered 2024-03-30: 1 via ORAL
  Filled 2024-03-30 (×2): qty 1

## 2024-03-30 MED ORDER — POLYETHYLENE GLYCOL 3350 17 G PO PACK: 17.0000 g | PACK | Freq: Every day | ORAL | Status: DC

## 2024-03-30 NOTE — Progress Notes (Signed)
 PROGRESS NOTE    Joe [name]  FMW:[id number] DOB: 1951-02-18 DOA: 03/28/2024 PCP: Merilee Rush   Brief Narrative: Joe [name] is a 73 y.o. male with a history of diabetes mellitus type 2, hypertension, hyperlipidemia, NASH cirrhosis, neuropathy, GERD.  Patient presented secondary to abnormal labs and imaging with evidence of hypercalcemia and lytic bone lesions concerning for metastatic disease. Patient started on IV fluids and given Zometa  and calcitonin. Medical oncology consulted.   Assessment and Plan:  Hypercalcemia Presumed secondary to malignancy. Serum calcium  measured at >15 on admission. Ionized calcium  level not obtained; albumin within normal range. Patient started on IV fluids and calcitonin. Given one dose of zoledronic  acid. 25-vitamin D  normal. Calcium  down to 13.4 this morning -Continue NS IV fluids at 100 mL/hr -Continue calcitonin -Daily BMP -Follow-up PTH, PTHrP, SPEP  Mixed lytic lesions Noted incidentally. Patient with imaging evidence of extensive osseous metastasis; specifically, several rib lesions with associated soft tissue component. IR consulted for bone biopsy. MRI cervical spine shows increased number of identified lytic lesions located in the skull and cervical spine -Medical oncology consultation: multiple myeloma workup -IR biopsy  AKI stage 2 Previous baseline creatinine of 0.71 from June 2025. Creatinine of 1.28 on admission. -Management with IV fluids  Heterogenous prostate Noted on imaging. PSA obtained and is 2.41.  Left hand paresthesia Extension to forearm. Patient also reports similar symptoms of right hand. Prior history of neuropathy. MRI cervical spine and significant for C6-C7 foraminal stenosis which may be contributory. Symptoms are intermittent and patient without associated weakness.  Hypernatremia Secondary to NS IV fluids. -Improved after change in IV fluid rate  Diabetes mellitus type 2 Well controlled based on last  hemoglobin A1C of 5.3% from June 2025. Patient is on Jardiance and Ozempic as an outpatient, which were held on admission. -Hemoglobin A1C pending  Primary hypertension Patient is on Coreg  and losartan as an outpatient. -Continue Coreg  and losartan  Hyperlipidemia -Continue Crestor   NASH cirrhosis Noted. Compensated.  Neuropathy Noted. Not on outpatient medication management.  GERD -Continue famotidine  and Protonix  (substituted for outpatient Nexium)  Anxiety -Continue diazepam   Left foot drop -Follow-up cervical spine MRI    DVT prophylaxis: SCDs Code Status:   Code Status: Limited: Do not attempt resuscitation (DNR) -DNR-LIMITED -Do Not Intubate/DNI  Family Communication: Niece and sister at bedside Disposition Plan: Discharge likely home once calcium  improved and stable, in addition to specialist recommendations   Consultants:  Medical oncology  Procedures:  None  Antimicrobials: None    Subjective: No specific concerns this morning except continued urinary frequency. He reports this was not an issue until recently.  Objective: BP 137/73   Pulse (!) 58   Temp 98.2 F (36.8 C) (Oral)   Resp 15   Ht 5' 7 (1.702 m)   Wt 72.7 kg   SpO2 99%   BMI 25.10 kg/m   Examination:  General exam: Appears calm and comfortable Respiratory system: Clear to auscultation. Respiratory effort normal. Cardiovascular system: S1 & S2 heard, RRR. No murmurs. Gastrointestinal system: Abdomen is nondistended, soft and nontender. Normal bowel sounds heard. Central nervous system: Alert and oriented. No focal neurological deficits. Psychiatry: Judgement and insight appear normal. Mood & affect appropriate.    Data Reviewed: I have personally reviewed following labs and imaging studies  CBC Lab Results  Component Value Date   WBC 7.2 03/29/2024   RBC 4.05 (L) 03/29/2024   HGB 13.3 03/29/2024   HCT 42.1 03/29/2024   MCV 104.0 (H)  03/29/2024   MCH 32.8 03/29/2024    PLT 222 03/29/2024   MCHC 31.6 03/29/2024   RDW 12.7 03/29/2024   LYMPHSABS 2.9 03/28/2024   MONOABS 0.6 03/28/2024   EOSABS 0.2 03/28/2024   BASOSABS 0.1 03/28/2024     Last metabolic panel Lab Results  Component Value Date   NA 145 03/30/2024   K 3.8 03/30/2024   CL 109 03/30/2024   CO2 28 03/30/2024   BUN 26 (H) 03/30/2024   CREATININE 1.10 03/30/2024   GLUCOSE 99 03/30/2024   GFRNONAA >60 03/30/2024   CALCIUM  11.1 (H) 03/30/2024   PHOS 3.5 03/29/2024   PROT 8.3 (H) 03/28/2024   ALBUMIN 3.5 03/28/2024   BILITOT 1.0 03/28/2024   ALKPHOS 125 03/28/2024   AST 27 03/28/2024   ALT 13 03/28/2024   ANIONGAP 8 03/30/2024    GFR: Estimated Creatinine Clearance: 54.2 mL/min (by C-G formula based on SCr of 1.1 mg/dL).  Recent Results (from the past 240 hours)  MRSA Next Gen by PCR, Nasal     Status: None   Collection Time: 03/28/24  5:43 PM   Specimen: Nasal Mucosa; Nasal Swab  Result Value Ref Range Status   MRSA by PCR Next Gen NOT DETECTED NOT DETECTED Final    Comment: (NOTE) The GeneXpert MRSA Assay (FDA approved for NASAL specimens only), is one component of a comprehensive MRSA colonization surveillance program. It is not intended to diagnose MRSA infection nor to guide or monitor treatment for MRSA infections. Test performance is not FDA approved in patients less than 33 years old. Performed at [facility], [address] [place], [place] [address]       Radiology Studies: MR CERVICAL SPINE WO CONTRAST Result Date: 03/29/2024 CLINICAL DATA:  Provided history: Bilateral hand paresthesia. History of osseous metastases. Evaluate for compressive cervical spine lesion. EXAM: MRI CERVICAL SPINE WITHOUT CONTRAST TECHNIQUE: Multiplanar, multisequence MR imaging of the cervical spine was performed. No intravenous contrast was administered. COMPARISON:  None. FINDINGS: Alignment: No significant spondylolisthesis. Vertebrae: Multifocal signal  abnormality within the cervical spine and within visible portions of the skull base, calvarium, thoracic spine and ribs consistent with widespread osseous metastatic disease. Notably, there are metastases within the C4 and C6 vertebrae which involve the superior endplates at these levels (for instance as seen on series 6, images 8 and 9). Cord: No signal abnormality identified within the cervical spinal cord. Posterior Fossa, vertebral arteries, paraspinal tissues: Flow voids preserved within visible portions of the cervical vertebral arteries. No paraspinal mass or collection. Disc levels: No more than mild disc degeneration within the cervical spine. C2-C3: No significant disc herniation or spinal canal stenosis. Uncovertebral hypertrophy on the right resulting in moderate right neural foraminal narrowing. C3-C4: Small central disc protrusion. Uncovertebral hypertrophy on the right. The disc protrusion minimally effaces the thecal sac (without significant spinal canal stenosis). Mild right neural foraminal narrowing. C4-C5: Slight disc bulge. Mild facet hypertrophy on the left. Slight ligamentum flavum thickening. No significant spinal canal stenosis. Mild left neural foraminal narrowing. C5-C6: Slight disc bulge. Minimal facet hypertrophy. Mild ligamentum flavum thickening. No significant spinal canal or neural foraminal narrowing. C6-C7: Slight disc bulge. Uncovertebral hypertrophy (greater on the left). No significant spinal canal stenosis. Moderate left neural foraminal narrowing. C7-T1: No significant disc herniation or stenosis. IMPRESSION: 1. 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 Of note, there are metastases within the C4 and C6  vertebrae which involve the superior endplates at these levels. No tumor encroachment upon the cervical spinal canal or neural foramina. 2. Cervical spondylosis as outlined  within the body of the report. No significant spinal canal stenosis. Multilevel neural foraminal stenosis, greatest on the right at C2-C3 and on the left at C6-C7 (moderate at these sites). Electronically Signed   By: Rockey Childs D.O.   On: 03/29/2024 14:31   MR ABDOMEN WWO CONTRAST Result Date: 03/28/2024 CLINICAL DATA:  New diagnosis of multiple bone metastases. Screening for primary neoplastic process. EXAM: MRI ABDOMEN AND PELVIS WITHOUT AND WITH CONTRAST TECHNIQUE: Multiplanar multisequence MR imaging of the abdomen and pelvis was performed both before and after the administration of intravenous contrast. CONTRAST:  7mL GADAVIST  GADOBUTROL  1 MMOL/ML IV SOLN COMPARISON:  None Available. FINDINGS: COMBINED FINDINGS FOR BOTH MR ABDOMEN AND PELVIS Lower chest: Unremarkable MR appearance to the lung bases. No pleural effusion. No pericardial effusion. Normal heart size. Hepatobiliary: The liver is normal in size and configuration. No intrahepatic or extrahepatic bile duct dilatation. No choledocholithiasis. Small volume dependent gallstones noted without imaging signs of acute cholecystitis. Pancreas: No mass, inflammatory changes or other parenchymal abnormality identified. No main pancreatic duct dilation. Spleen:  Within normal limits in size and appearance. No focal mass. Adrenals/Urinary Tract: Unremarkable adrenal glands. No hydroureteronephrosis. Simple cyst noted in the right kidney lower pole measuring 1.5 x 1.6 cm. There is a smaller simple cyst in the left kidney upper pole, laterally. Stomach/Bowel: Visualized portions within the abdomen are unremarkable. No disproportionate dilation of bowel loops. Multiple colonic diverticula noted without diverticulitis. Vascular/Lymphatic: No pathologically enlarged lymph nodes identified. No abdominal aortic aneurysm demonstrated. No ascites. Reproductive: The prostate is not well evaluated on this exam. However, there is heterogeneous transitional zone,  especially near the apex. Correlation with serum PSA is recommended to determine the need for dedicated pelvic MRI as per prostate protocol. Other:  None. Musculoskeletal: There are extensive mixed T2 hypointense and hyperintense lesions throughout the imaged bones compatible with metastases. There are several rib lesions with associated soft tissue component, as seen on the coronal images, better evaluated on the recent chest CT scan. IMPRESSION: 1. Extensive osseous metastases. There are several rib lesions with associated soft tissue component, better evaluated on the recent chest CT scan. 2. No primary neoplastic process identified in the abdomen or pelvis. 3. Heterogeneous prostate transitional zone, especially near the apex. Correlation with serum PSA is recommended to determine the need for dedicated pelvic MRI as per prostate protocol. 4. Cholelithiasis without imaging signs of acute cholecystitis. 5. Multiple other nonacute observations, as described above. Electronically Signed   By: Ree Molt M.D.   On: 03/28/2024 18:53   MR PELVIS W WO CONTRAST Result Date: 03/28/2024 CLINICAL DATA:  New diagnosis of multiple bone metastases. Screening for primary neoplastic process. EXAM: MRI ABDOMEN AND PELVIS WITHOUT AND WITH CONTRAST TECHNIQUE: Multiplanar multisequence MR imaging of the abdomen and pelvis was performed both before and after the administration of intravenous contrast. CONTRAST:  7mL GADAVIST  GADOBUTROL  1 MMOL/ML IV SOLN COMPARISON:  None Available. FINDINGS: COMBINED FINDINGS FOR BOTH MR ABDOMEN AND PELVIS Lower chest: Unremarkable MR appearance to the lung bases. No pleural effusion. No pericardial effusion. Normal heart size. Hepatobiliary: The liver is normal in size and configuration. No intrahepatic or extrahepatic bile duct dilatation. No choledocholithiasis. Small volume dependent gallstones noted without imaging signs of acute cholecystitis. Pancreas: No mass, inflammatory changes or  other parenchymal abnormality identified. No main pancreatic duct  dilation. Spleen:  Within normal limits in size and appearance. No focal mass. Adrenals/Urinary Tract: Unremarkable adrenal glands. No hydroureteronephrosis. Simple cyst noted in the right kidney lower pole measuring 1.5 x 1.6 cm. There is a smaller simple cyst in the left kidney upper pole, laterally. Stomach/Bowel: Visualized portions within the abdomen are unremarkable. No disproportionate dilation of bowel loops. Multiple colonic diverticula noted without diverticulitis. Vascular/Lymphatic: No pathologically enlarged lymph nodes identified. No abdominal aortic aneurysm demonstrated. No ascites. Reproductive: The prostate is not well evaluated on this exam. However, there is heterogeneous transitional zone, especially near the apex. Correlation with serum PSA is recommended to determine the need for dedicated pelvic MRI as per prostate protocol. Other:  None. Musculoskeletal: There are extensive mixed T2 hypointense and hyperintense lesions throughout the imaged bones compatible with metastases. There are several rib lesions with associated soft tissue component, as seen on the coronal images, better evaluated on the recent chest CT scan. IMPRESSION: 1. Extensive osseous metastases. There are several rib lesions with associated soft tissue component, better evaluated on the recent chest CT scan. 2. No primary neoplastic process identified in the abdomen or pelvis. 3. Heterogeneous prostate transitional zone, especially near the apex. Correlation with serum PSA is recommended to determine the need for dedicated pelvic MRI as per prostate protocol. 4. Cholelithiasis without imaging signs of acute cholecystitis. 5. Multiple other nonacute observations, as described above. Electronically Signed   By: Ree Molt M.D.   On: 03/28/2024 18:53      LOS: 2 days    Elgin Lam, MD [facility] 03/30/2024, 9:54 AM   If 7PM-7AM, please  contact night-coverage [email or web address]

## 2024-03-30 NOTE — TOC Progression Note (Signed)
 Transition of Care [facility]) - Progression Note    Patient Details  Name: Joe [name] MRN: [id number] Date of Birth: 1950/12/16  Transition of Care [facility]) CM/SW Contact  Sheri ONEIDA Sharps, LCSW Phone Number: 03/30/2024, 10:12 AM  Clinical Narrative:    Pt recommended for HHPT and RW. Pt accepting of rec. HH setup w/ [facility]. HH info added to AVS. RW ordered via [employer] to be delivered to room.     Barriers to Discharge: Continued Medical Work up               Expected Discharge Plan and Services In-house Referral: NA Discharge Planning Services: NA   Living arrangements for the past 2 months: Single Family Home                 DME Arranged: N/A DME Agency: NA       HH Arranged: NA HH Agency: NA         Social Drivers of Health (SDOH) Interventions SDOH Screenings   Food Insecurity: No Food Insecurity (03/28/2024)  Housing: Low Risk  (03/28/2024)  Transportation Needs: No Transportation Needs (03/28/2024)  Utilities: Not At Risk (03/28/2024)  Social Connections: Socially Isolated (03/28/2024)  Tobacco Use: Unknown (03/28/2024)    Readmission Risk Interventions    03/29/2024    1:27 PM  Readmission Risk Prevention Plan  Post Dischage Appt Complete  Medication Screening Complete  Transportation Screening Complete

## 2024-03-30 NOTE — Plan of Care (Signed)
  Problem: Health Behavior/Discharge Planning: Goal: Ability to manage health-related needs will improve Outcome: Progressing   Problem: Clinical Measurements: Goal: Ability to maintain clinical measurements within normal limits will improve Outcome: Progressing Goal: Will remain free from infection Outcome: Progressing Goal: Diagnostic test results will improve Outcome: Progressing Goal: Respiratory complications will improve Outcome: Progressing   Problem: Activity: Goal: Risk for activity intolerance will decrease Outcome: Progressing   Problem: Nutrition: Goal: Adequate nutrition will be maintained Outcome: Progressing   

## 2024-03-30 NOTE — Consult Note (Signed)
 Chief Complaint: Patient was seen in consultation today for bone lesion bx  Chief Complaint  Patient presents with   abnormal labs   at the request of Segars, Dorn / Lanny Callander   Referring Physician(s): Segars, Dorn / Lanny Callander   Supervising Physician: Jenna Hacker  Patient Status: [facility] - In-pt  History of Present Illness: Joe [name] is a 73 y.o. male with PMHs of diabetes type 2, hypertension, hyperlipidemia, [name] cirrhosis, neuropathy, GERD, recent finding of lytic bone lesions, IR was consulted for biopsy.   Patient developed chest pain and his PCP ordered CXR which showed multiple lytic lesions involving right ribs and right scapula. CT and MR showed extensive osseous metastases with no primary neoplastic process identified in the chest abdomen or pelvis. Lab on 8/1 showed severe hypercalcemia and patient was instructed to go to ED for further eval and management.   Oncology was consulted on 8/2, so far concern for metastatic prostate CA (however PSA is normal) or multiple myeloma. Serologic work up ongoing for multiple myeloma, IR was consulted for possible bone lesion biopsy. Case reviewed and approved by Dr. Jenna, so far the iliac lesion may be the best target but the imaging will need be reviewed by performing interventional radiologist. Spoke with Dr. Lanny for possible BMBx, Dr. Lanny asks if bone marrow aspiration can be performed at the same time, not not possible bone lesion bx alone is sufficient. Will discuss with performing radiologist.   Patient laying in bed, not in acute distress.  Denise headache, fever, chills, shortness of breath, cough, chest pain, abdominal pain, nausea ,vomiting, and bleeding.  Patient currently has DNR order in place. Discussion with the patient and family regarding wishes.  The DNR order is rescinded during the procedure and the patient consents to the use of any resuscitation procedure needed to treat the clinical events that  occur.    Past Medical History:  Diagnosis Date   Anxiety    Diabetes mellitus without complication (HCC)     History reviewed. No pertinent surgical history.  Allergies: Patient has no known allergies.  Medications: Prior to Admission medications   Medication Sig Start Date End Date Taking? Authorizing Provider  carvedilol  (COREG ) 6.25 MG tablet Take 6.25 mg by mouth 2 (two) times daily with a meal. 10/19/22  Yes [provider]  diazepam  (VALIUM ) 2 MG tablet Take 2 mg by mouth in the morning and at bedtime. 12/13/15  Yes [provider]  esomeprazole (NEXIUM) 40 MG capsule Take 40 mg by mouth daily before breakfast. 11/26/15  Yes [provider]  famotidine  (PEPCID ) 20 MG tablet Take 20 mg by mouth at bedtime. 11/02/23  Yes [provider]  FLONASE ALLERGY RELIEF 50 MCG/ACT nasal spray Place 1-2 sprays into both nostrils in the morning and at bedtime.   Yes [provider]  JARDIANCE 10 MG TABS tablet Take 10 mg by mouth daily.   Yes [provider]  losartan (COZAAR) 100 MG tablet Take 50 mg by mouth daily. 12/28/15  Yes [provider]  Multiple Vitamin (MULTIVITAMIN) tablet Take 1 tablet by mouth daily with breakfast.   Yes [provider]  OZEMPIC, 2 MG/DOSE, 8 MG/3ML SOPN Inject 2 mg into the skin every Saturday. 08/04/22  Yes [provider]  rosuvastatin  (CRESTOR ) 10 MG tablet Take 10 mg by mouth daily. 02/01/16  Yes [provider]  sildenafil (VIAGRA) 100 MG tablet Take 100 mg by mouth daily as needed (for E.D.).  Yes [provider]  [facility] VERIO test strip  12/26/15   [provider]     History reviewed. No pertinent family history.  Social History   Socioeconomic History   Marital status: Married    Spouse name: Not on file   Number of children: Not on file   Years of education: Not on file   Highest education level: Not on file  Occupational History   Not on  file  Tobacco Use   Smoking status: Never   Smokeless tobacco: Not on file  Substance and Sexual Activity   Alcohol use: No    Alcohol/week: 0.0 standard drinks of alcohol   Drug use: No   Sexual activity: Not on file  Other Topics Concern   Not on file  Social History Narrative   Not on file   Social Drivers of Health   Financial Resource Strain: Not on file  Food Insecurity: No Food Insecurity (03/28/2024)   Hunger Vital Sign    Worried About Running Out of Food in the Last Year: Never true    Ran Out of Food in the Last Year: Never true  Transportation Needs: No Transportation Needs (03/28/2024)   PRAPARE - Administrator, Civil Service (Medical): No    Lack of Transportation (Non-Medical): No  Physical Activity: Not on file  Stress: Not on file  Social Connections: Socially Isolated (03/28/2024)   Social Connection and Isolation Panel    Frequency of Communication with Friends and Family: More than three times a week    Frequency of Social Gatherings with Friends and Family: More than three times a week    Attends Religious Services: Never    Database administrator or Organizations: No    Attends Banker Meetings: Patient unable to answer    Marital Status: Widowed     Review of Systems: A 12 point ROS discussed and pertinent positives are indicated in the HPI above.  All other systems are negative.  Vital Signs: BP 134/68   Pulse (!) 59   Temp 98.2 F (36.8 C) (Oral)   Resp 18   Ht 5' 7 (1.702 m)   Wt 160 lb 4.4 oz (72.7 kg)   SpO2 97%   BMI 25.10 kg/m    Physical Exam Vitals and nursing note reviewed.  Constitutional:      General: Patient is not in acute distress.    Appearance: Normal appearance. Patient is not ill-appearing.  HENT:     Head: Normocephalic and atraumatic.     Mouth/Throat:     Mouth: Mucous membranes are moist.     Pharynx: Oropharynx is clear.  Cardiovascular:     Rate and Rhythm: Normal rate and regular  rhythm.     Pulses: Normal pulses.     Heart sounds: Normal heart sounds.  Pulmonary:     Effort: Pulmonary effort is normal.     Breath sounds: Normal breath sounds.  Abdominal:     General: Abdomen is flat. Bowel sounds are normal.     Palpations: Abdomen is soft.  Musculoskeletal:     Cervical back: Neck supple.  Skin:    General: Skin is warm and dry.     Coloration: Skin is not jaundiced or pale.  Neurological:     Mental Status: Patient is alert and oriented to person, place, and time.  Psychiatric:        Mood and Affect: Mood normal.  Behavior: Behavior normal.        Judgment: Judgment normal.    MD Evaluation Airway: WNL Heart: WNL Abdomen: WNL Chest/ Lungs: WNL ASA  Classification: 3 Mallampati/Airway Score: One  Imaging: MR CERVICAL SPINE WO CONTRAST Result Date: 03/29/2024 CLINICAL DATA:  Provided history: Bilateral hand paresthesia. History of osseous metastases. Evaluate for compressive cervical spine lesion. EXAM: MRI CERVICAL SPINE WITHOUT CONTRAST TECHNIQUE: Multiplanar, multisequence MR imaging of the cervical spine was performed. No intravenous contrast was administered. COMPARISON:  None. FINDINGS: Alignment: No significant spondylolisthesis. Vertebrae: Multifocal signal abnormality within the cervical spine and within visible portions of the skull base, calvarium, thoracic spine and ribs consistent with widespread osseous metastatic disease. Notably, there are metastases within the C4 and C6 vertebrae which involve the superior endplates at these levels (for instance as seen on series 6, images 8 and 9). Cord: No signal abnormality identified within the cervical spinal cord. Posterior Fossa, vertebral arteries, paraspinal tissues: Flow voids preserved within visible portions of the cervical vertebral arteries. No paraspinal mass or collection. Disc levels: No more than mild disc degeneration within the cervical spine. C2-C3: No significant disc herniation or  spinal canal stenosis. Uncovertebral hypertrophy on the right resulting in moderate right neural foraminal narrowing. C3-C4: Small central disc protrusion. Uncovertebral hypertrophy on the right. The disc protrusion minimally effaces the thecal sac (without significant spinal canal stenosis). Mild right neural foraminal narrowing. C4-C5: Slight disc bulge. Mild facet hypertrophy on the left. Slight ligamentum flavum thickening. No significant spinal canal stenosis. Mild left neural foraminal narrowing. C5-C6: Slight disc bulge. Minimal facet hypertrophy. Mild ligamentum flavum thickening. No significant spinal canal or neural foraminal narrowing. C6-C7: Slight disc bulge. Uncovertebral hypertrophy (greater on the left). No significant spinal canal stenosis. Moderate left neural foraminal narrowing. C7-T1: No significant disc herniation or stenosis. IMPRESSION: 1. 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 Of note, there are metastases within the C4 and C6 vertebrae which involve the superior endplates at these levels. No tumor encroachment upon the cervical spinal canal or neural foramina. 2. Cervical spondylosis as outlined within the body of the report. No significant spinal canal stenosis. Multilevel neural foraminal stenosis, greatest on the right at C2-C3 and on the left at C6-C7 (moderate at these sites). Electronically Signed   By: Rockey Childs D.O.   On: 03/29/2024 14:31   MR ABDOMEN WWO CONTRAST Result Date: 03/28/2024 CLINICAL DATA:  New diagnosis of multiple bone metastases. Screening for primary neoplastic process. EXAM: MRI ABDOMEN AND PELVIS WITHOUT AND WITH CONTRAST TECHNIQUE: Multiplanar multisequence MR imaging of the abdomen and pelvis was performed both before and after the administration of intravenous contrast. CONTRAST:  7mL GADAVIST  GADOBUTROL  1 MMOL/ML IV SOLN COMPARISON:  None Available.  FINDINGS: COMBINED FINDINGS FOR BOTH MR ABDOMEN AND PELVIS Lower chest: Unremarkable MR appearance to the lung bases. No pleural effusion. No pericardial effusion. Normal heart size. Hepatobiliary: The liver is normal in size and configuration. No intrahepatic or extrahepatic bile duct dilatation. No choledocholithiasis. Small volume dependent gallstones noted without imaging signs of acute cholecystitis. Pancreas: No mass, inflammatory changes or other parenchymal abnormality identified. No main pancreatic duct dilation. Spleen:  Within normal limits in size and appearance. No focal mass. Adrenals/Urinary Tract: Unremarkable adrenal glands. No hydroureteronephrosis. Simple cyst noted in the right kidney lower pole measuring 1.5 x 1.6 cm. There is a smaller simple cyst in the left kidney upper pole, laterally. Stomach/Bowel:  Visualized portions within the abdomen are unremarkable. No disproportionate dilation of bowel loops. Multiple colonic diverticula noted without diverticulitis. Vascular/Lymphatic: No pathologically enlarged lymph nodes identified. No abdominal aortic aneurysm demonstrated. No ascites. Reproductive: The prostate is not well evaluated on this exam. However, there is heterogeneous transitional zone, especially near the apex. Correlation with serum PSA is recommended to determine the need for dedicated pelvic MRI as per prostate protocol. Other:  None. Musculoskeletal: There are extensive mixed T2 hypointense and hyperintense lesions throughout the imaged bones compatible with metastases. There are several rib lesions with associated soft tissue component, as seen on the coronal images, better evaluated on the recent chest CT scan. IMPRESSION: 1. Extensive osseous metastases. There are several rib lesions with associated soft tissue component, better evaluated on the recent chest CT scan. 2. No primary neoplastic process identified in the abdomen or pelvis. 3. Heterogeneous prostate transitional  zone, especially near the apex. Correlation with serum PSA is recommended to determine the need for dedicated pelvic MRI as per prostate protocol. 4. Cholelithiasis without imaging signs of acute cholecystitis. 5. Multiple other nonacute observations, as described above. Electronically Signed   By: Ree Molt M.D.   On: 03/28/2024 18:53   MR PELVIS W WO CONTRAST Result Date: 03/28/2024 CLINICAL DATA:  New diagnosis of multiple bone metastases. Screening for primary neoplastic process. EXAM: MRI ABDOMEN AND PELVIS WITHOUT AND WITH CONTRAST TECHNIQUE: Multiplanar multisequence MR imaging of the abdomen and pelvis was performed both before and after the administration of intravenous contrast. CONTRAST:  7mL GADAVIST  GADOBUTROL  1 MMOL/ML IV SOLN COMPARISON:  None Available. FINDINGS: COMBINED FINDINGS FOR BOTH MR ABDOMEN AND PELVIS Lower chest: Unremarkable MR appearance to the lung bases. No pleural effusion. No pericardial effusion. Normal heart size. Hepatobiliary: The liver is normal in size and configuration. No intrahepatic or extrahepatic bile duct dilatation. No choledocholithiasis. Small volume dependent gallstones noted without imaging signs of acute cholecystitis. Pancreas: No mass, inflammatory changes or other parenchymal abnormality identified. No main pancreatic duct dilation. Spleen:  Within normal limits in size and appearance. No focal mass. Adrenals/Urinary Tract: Unremarkable adrenal glands. No hydroureteronephrosis. Simple cyst noted in the right kidney lower pole measuring 1.5 x 1.6 cm. There is a smaller simple cyst in the left kidney upper pole, laterally. Stomach/Bowel: Visualized portions within the abdomen are unremarkable. No disproportionate dilation of bowel loops. Multiple colonic diverticula noted without diverticulitis. Vascular/Lymphatic: No pathologically enlarged lymph nodes identified. No abdominal aortic aneurysm demonstrated. No ascites. Reproductive: The prostate is not well  evaluated on this exam. However, there is heterogeneous transitional zone, especially near the apex. Correlation with serum PSA is recommended to determine the need for dedicated pelvic MRI as per prostate protocol. Other:  None. Musculoskeletal: There are extensive mixed T2 hypointense and hyperintense lesions throughout the imaged bones compatible with metastases. There are several rib lesions with associated soft tissue component, as seen on the coronal images, better evaluated on the recent chest CT scan. IMPRESSION: 1. Extensive osseous metastases. There are several rib lesions with associated soft tissue component, better evaluated on the recent chest CT scan. 2. No primary neoplastic process identified in the abdomen or pelvis. 3. Heterogeneous prostate transitional zone, especially near the apex. Correlation with serum PSA is recommended to determine the need for dedicated pelvic MRI as per prostate protocol. 4. Cholelithiasis without imaging signs of acute cholecystitis. 5. Multiple other nonacute observations, as described above. Electronically Signed   By: Ree Molt M.D.   On: 03/28/2024 18:53  CT CHEST W CONTRAST Result Date: 03/26/2024 CLINICAL DATA:  Abnormal chest x-ray. Pain in left side of lungs. * Tracking Code: BO * EXAM: CT CHEST WITH CONTRAST TECHNIQUE: Multidetector CT imaging of the chest was performed during intravenous contrast administration. RADIATION DOSE REDUCTION: This exam was performed according to the departmental dose-optimization program which includes automated exposure control, adjustment of the mA and/or kV according to patient size and/or use of iterative reconstruction technique. CONTRAST:  100mL ISOVUE -300 IOPAMIDOL  (ISOVUE -300) INJECTION 61% COMPARISON:  None Available. FINDINGS: Cardiovascular: Normal cardiac size. No pericardial effusion. No aortic aneurysm. There are coronary artery calcifications, in keeping with coronary artery disease. There are also mild  peripheral atherosclerotic vascular calcifications of thoracic aorta and its major branches. Mediastinum/Nodes: Visualized thyroid  gland appears grossly unremarkable. No solid / cystic mediastinal masses. The esophagus is nondistended precluding optimal assessment. No axillary, mediastinal or hilar lymphadenopathy by size criteria. Lungs/Pleura: The central tracheo-bronchial tree is patent. No mass or consolidation. No pleural effusion or pneumothorax. No suspicious lung nodules. Upper Abdomen: Small volume calcified gallstones noted without imaging signs of acute cholecystitis. Remaining visualized upper abdominal viscera within normal limits. Musculoskeletal: The visualized soft tissues of the chest wall are grossly unremarkable. There are extensive lytic lesions throughout the imaged bones, compatible with metastases. There is also associated destruction as well as expansile lytic lesions with soft tissue in several ribs including largest lesion along the anterolateral right fourth rib with soft tissue measuring up to 2.0 x 2.9 cm. There are mild multilevel degenerative changes in the visualized spine. IMPRESSION: 1. There are extensive lytic lesions throughout the imaged bones, compatible with metastases. There is also associated destruction as well as expansile lytic lesions with soft tissue in several ribs including largest lesion along the anterolateral right fourth rib with soft tissue measuring up to 2.0 x 2.9 cm. 2. No lung mass, consolidation, pleural effusion or pneumothorax. 3. Multiple other nonacute observations, as described above. Aortic Atherosclerosis (ICD10-I70.0). Electronically Signed   By: Ree Molt M.D.   On: 03/26/2024 16:32   DG Chest 2 View Result Date: 03/25/2024 CLINICAL DATA:  Chest pain for 3 weeks. EXAM: CHEST - 2 VIEW COMPARISON:  None Available. FINDINGS: The heart size and mediastinal contours are within normal limits. There appears to be a pleural based mass in the right  midlung. There is also the suggestion of possible lytic lesion involving the right eighth rib laterally as well as possible other lytic lesions involving the right ribs and possibly scapula. No definite consolidative process is noted. IMPRESSION: Probable pleural based mass seen in right midlung with possible lytic lesions involving multiple right ribs and right scapula. CT scan of the chest is recommended for further evaluation. These results will be called to the ordering clinician or representative by the Radiologist Assistant, and communication documented in the PACS or zVision Dashboard. Electronically Signed   By: Lynwood Landy Raddle M.D.   On: 03/25/2024 17:03    Labs:  CBC: Recent Labs    03/28/24 1820 03/29/24 0242  WBC 9.7 7.2  HGB 14.7 13.3  HCT 46.3 42.1  PLT 186 222    COAGS: Recent Labs    03/30/24 0246  INR 1.3*    BMP: Recent Labs    03/29/24 0242 03/29/24 0749 03/29/24 1123 03/29/24 1432 03/30/24 0246  NA 146* 148* 146* 141 145  K 3.6 4.2  --  3.0* 3.8  CL 107 110  --  104 109  CO2 32 29  --  30 28  GLUCOSE 105* 119*  --  114* 99  BUN 26* 25*  --  27* 26*  CALCIUM  13.4* 12.0*  --  11.8* 11.1*  CREATININE 1.13 1.08  --  1.08 1.10  GFRNONAA >60 >60  --  >60 >60    LIVER FUNCTION TESTS: Recent Labs    03/28/24 1820  BILITOT 1.0  AST 27  ALT 13  ALKPHOS 125  PROT 8.3*  ALBUMIN 3.5    TUMOR MARKERS: No results for input(s): AFPTM, CEA, CA199, CHROMGRNA in the last 8760 hours.  Assessment and Plan: 73 y.o. male with numerous lytic bone lesions who presents for biopsy.  VSS CBC stable INR 1.2 on 8/3  Calcium  11.1  NKDA Not on AC/AP  Risks and benefits of bone lesion biopsy and bone marrow aspiration  was discussed with the patient and/or patient's family including, but not limited to bleeding, infection, damage to adjacent structures or low yield requiring additional tests.  All of the questions were answered and there is agreement to  proceed.  Consent signed and in chart.  Th procedure is tentatively scheduled for Monday pending IR schedule.  PLAN - NPO except meds at [place] - CBC with diff in AM   Thank you for this interesting consult.  I greatly enjoyed meeting MANUS WEEDMAN and look forward to participating in their care.  A copy of this report was sent to the requesting provider on this date.  Electronically Signed: Toya VEAR Cousin, PA-C 03/30/2024, 11:41 AM   I spent a total of 40 Minutes    in face to face in clinical consultation, greater than 50% of which was counseling/coordinating care for bone lesion bx/bone marrow aspiration.   This chart was dictated using voice recognition software.  Despite best efforts to proofread,  errors can occur which can change the documentation meaning.

## 2024-03-30 NOTE — Evaluation (Signed)
 Occupational Therapy Evaluation Patient Details Name: Joe [name] MRN: [id number] DOB: 1951-02-26 Today's Date: 03/30/2024   History of Present Illness   Joe [name] is a 73 y.o. 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 No  tumor encroachment upon the cervical spinal canal or neural  foramina. PMH: type 2 diabetes, hypertension, liver cirrhosis secondary to [facility]     Clinical Impressions PTA, pt typically lives alone though daughter recently moved in. Pt reports complete Independence with ADLs, IADLs, driving and mobility (reports intermittent quad cane use but he usually just carries it). Pt presents now with minor deficits in dynamic standing balance, endurance and chest discomfort with bed mobility. Overall, pt able to mobilize unit without AD with no more than CGA. Pt able to manage ADLs with Modified Independence-Supervision without overt safety concerns. Will continue to follow acutely but anticipate no OT needs upon DC.     If plan is discharge home, recommend the following:   Other (comment);Assist for transportation (PRN)     Functional Status Assessment   Patient has had a recent decline in their functional status and demonstrates the ability to make significant improvements in function in a reasonable and predictable amount of time.     Equipment Recommendations   None recommended by OT     Recommendations for Other Services         Precautions/Restrictions   Precautions Precautions: Fall Recall of Precautions/Restrictions: Intact Precaution/Restrictions Comments: reports new onset foot drop Restrictions Weight Bearing Restrictions Per Provider Order: No     Mobility Bed Mobility Overal bed mobility: Modified  Independent Bed Mobility: Supine to Sit, Sit to Supine                Transfers Overall transfer level: Independent Equipment used: None                      Balance Overall balance assessment: Mild deficits observed, not formally tested                                         ADL either performed or assessed with clinical judgement   ADL Overall ADL's : Needs assistance/impaired Eating/Feeding: Independent   Grooming: Independent;Standing;Oral care   Upper Body Bathing: Modified independent   Lower Body Bathing: Supervison/ safety   Upper Body Dressing : Modified independent   Lower Body Dressing: Supervision/safety;Set up   Toilet Transfer: Supervision/safety   Toileting- Clothing Manipulation and Hygiene: Supervision/safety;Sit to/from stand;Sitting/lateral lean       Functional mobility during ADLs: Supervision/safety;Contact guard assist General ADL Comments: able to mobilize unit without AD without major LOB, minor sway     Vision Baseline Vision/History: 1 Wears glasses Ability to See in Adequate Light: 0 Adequate Patient Visual Report: No change from baseline Vision Assessment?: No apparent visual deficits     Perception         Praxis         Pertinent Vitals/Pain Pain Assessment Pain Assessment: Faces Faces Pain Scale: Hurts a little bit Pain Location: chest with bed mobility Pain Descriptors / Indicators: Aching Pain Intervention(s): Monitored during session     Extremity/Trunk Assessment Upper  Extremity Assessment Upper Extremity Assessment: Overall WFL for tasks assessed;Right hand dominant   Lower Extremity Assessment Lower Extremity Assessment: Defer to PT evaluation   Cervical / Trunk Assessment Cervical / Trunk Assessment: Normal   Communication Communication Communication: No apparent difficulties   Cognition Arousal: Alert Behavior During Therapy: WFL for tasks assessed/performed Cognition:  No apparent impairments             OT - Cognition Comments: minor attention, awareness deficits. functional though not formally assessed                 Following commands: Intact       Cueing  General Comments   Cueing Techniques: Verbal cues      Exercises     Shoulder Instructions      Home Living Family/patient expects to be discharged to:: Private residence Living Arrangements: Children Available Help at Discharge: Family Type of Home: House Home Access: Ramped entrance     Home Layout: Two level Alternate Level Stairs-Number of Steps: has an Consulting civil engineer Shower/Tub: Tub/shower unit;Walk-in Human resources officer: Standard     Home Equipment: Cane - quad;Shower seat - built in   Additional Comments: daughter recently moved in d/t her own recent mental health issues.      Prior Functioning/Environment Prior Level of Function : Independent/Modified Independent;Driving             Mobility Comments: quad cane for mobility ADLs Comments: Indep with ADLs, IADLs, driving    OT Problem List: Impaired balance (sitting and/or standing);Decreased activity tolerance;Decreased knowledge of use of DME or AE   OT Treatment/Interventions: Self-care/ADL training;Therapeutic exercise;Energy conservation;DME and/or AE instruction;Therapeutic activities;Patient/family education;Balance training      OT Goals(Current goals can be found in the care plan section)   Acute Rehab OT Goals Patient Stated Goal: have biopsy, work on foot drop OT Goal Formulation: With patient Time For Goal Achievement: 04/13/24 Potential to Achieve Goals: Good   OT Frequency:  Min 2X/week    Co-evaluation              AM-PAC OT 6 Clicks Daily Activity     Outcome Measure Help from another person eating meals?: None Help from another person taking care of personal grooming?: None Help from another person toileting, which includes using toliet, bedpan, or  urinal?: A Little Help from another person bathing (including washing, rinsing, drying)?: A Little Help from another person to put on and taking off regular upper body clothing?: None Help from another person to put on and taking off regular lower body clothing?: A Little 6 Click Score: 21   End of Session Equipment Utilized During Treatment: Gait belt Nurse Communication: Other (comment);Mobility status (discussed with NT)  Activity Tolerance: Patient tolerated treatment well Patient left: in bed;with call bell/phone within reach  OT Visit Diagnosis: Unsteadiness on feet (R26.81)                Time: 8676-8656 OT Time Calculation (min): 20 min Charges:  OT General Charges $OT Visit: 1 Visit OT Evaluation $OT Eval Low Complexity: 1 Low  Mliss NOVAK, OTR/L Acute Rehab Services Office: [phone]   Mliss Fish 03/30/2024, 1:49 PM

## 2024-03-31 ENCOUNTER — Inpatient Hospital Stay (HOSPITAL_COMMUNITY)

## 2024-03-31 ENCOUNTER — Inpatient Hospital Stay: Payer: PRIVATE HEALTH INSURANCE

## 2024-03-31 ENCOUNTER — Inpatient Hospital Stay (HOSPITAL_BASED_OUTPATIENT_CLINIC_OR_DEPARTMENT_OTHER): Payer: PRIVATE HEALTH INSURANCE | Admitting: Physician Assistant

## 2024-03-31 DIAGNOSIS — E119 Type 2 diabetes mellitus without complications: Secondary | ICD-10-CM

## 2024-03-31 LAB — GLUCOSE, CAPILLARY
Glucose-Capillary: 84 mg/dL (ref 70–99)
Glucose-Capillary: 88 mg/dL (ref 70–99)
Glucose-Capillary: 108 mg/dL — ABNORMAL HIGH (ref 70–99)

## 2024-03-31 LAB — CBC WITH DIFFERENTIAL/PLATELET
Abs Immature Granulocytes: 0.03 K/uL (ref 0.00–0.07)
Basophils Absolute: 0.1 K/uL (ref 0.0–0.1)
Basophils Relative: 1 %
Eosinophils Absolute: 0.2 K/uL (ref 0.0–0.5)
Eosinophils Relative: 4 %
HCT: 37 % — ABNORMAL LOW (ref 39.0–52.0)
Hemoglobin: 12 g/dL — ABNORMAL LOW (ref 13.0–17.0)
Immature Granulocytes: 0 %
Lymphocytes Relative: 28 %
Lymphs Abs: 1.9 K/uL (ref 0.7–4.0)
MCH: 33.3 pg (ref 26.0–34.0)
MCHC: 32.4 g/dL (ref 30.0–36.0)
MCV: 102.8 fL — ABNORMAL HIGH (ref 80.0–100.0)
Monocytes Absolute: 0.5 K/uL (ref 0.1–1.0)
Monocytes Relative: 7 %
Neutro Abs: 4.1 K/uL (ref 1.7–7.7)
Neutrophils Relative %: 60 %
Platelets: 221 K/uL (ref 150–400)
RBC: 3.6 MIL/uL — ABNORMAL LOW (ref 4.22–5.81)
RDW: 12.9 % (ref 11.5–15.5)
WBC: 6.8 K/uL (ref 4.0–10.5)
nRBC: 0 % (ref 0.0–0.2)

## 2024-03-31 LAB — BASIC METABOLIC PANEL WITH GFR
Anion gap: 9 (ref 5–15)
BUN: 22 mg/dL (ref 8–23)
CO2: 27 mmol/L (ref 22–32)
Calcium: 9.6 mg/dL (ref 8.9–10.3)
Chloride: 107 mmol/L (ref 98–111)
Creatinine, Ser: 0.97 mg/dL (ref 0.61–1.24)
GFR, Estimated: >60 mL/min (ref >60)
Glucose, Bld: 97 mg/dL (ref 70–99)
Potassium: 3.2 mmol/L — ABNORMAL LOW (ref 3.5–5.1)
Sodium: 143 mmol/L (ref 135–145)

## 2024-03-31 LAB — PARATHYROID HORMONE, INTACT (NO CA): PTH: <6 pg/mL — ABNORMAL LOW (ref 15–65)

## 2024-03-31 MED ORDER — FENTANYL CITRATE (PF) 100 MCG/2ML IJ SOLN
INTRAMUSCULAR | Status: AC | PRN
  Administered 2024-03-31: 25 ug via INTRAVENOUS
  Administered 2024-03-31: 50 ug via INTRAVENOUS

## 2024-03-31 MED ORDER — OXYCODONE HCL 5 MG PO TABS: 5.0000 mg | ORAL_TABLET | Freq: Four times a day (QID) | ORAL | 0 refills | Status: DC | PRN

## 2024-03-31 MED ORDER — MIDAZOLAM HCL 2 MG/2ML IJ SOLN
INTRAMUSCULAR | Status: AC | PRN
Start: 2024-03-31 — End: 2024-03-31
  Administered 2024-03-31: 1 mg via INTRAVENOUS
  Administered 2024-03-31 (×2): .5 mg via INTRAVENOUS

## 2024-03-31 MED ORDER — POTASSIUM CHLORIDE CRYS ER 20 MEQ PO TBCR
40.0000 meq | EXTENDED_RELEASE_TABLET | Freq: Once | ORAL | Status: AC
  Administered 2024-03-31: 40 meq via ORAL
  Filled 2024-03-31: qty 2

## 2024-03-31 MED ORDER — FENTANYL CITRATE (PF) 100 MCG/2ML IJ SOLN
INTRAMUSCULAR | Status: AC
  Filled 2024-03-31: qty 2

## 2024-03-31 MED ORDER — FLUMAZENIL 0.5 MG/5ML IV SOLN
INTRAVENOUS | Status: AC
  Filled 2024-03-31: qty 5

## 2024-03-31 MED ORDER — SALINE SPRAY 0.65 % NA SOLN
1.0000 | NASAL | Status: DC | PRN
  Administered 2024-03-31: 1 via NASAL
  Filled 2024-03-31: qty 44

## 2024-03-31 MED ORDER — MIDAZOLAM HCL 2 MG/2ML IJ SOLN
INTRAMUSCULAR | Status: AC
  Filled 2024-03-31: qty 2

## 2024-03-31 MED ORDER — NALOXONE HCL 0.4 MG/ML IJ SOLN
INTRAMUSCULAR | Status: AC
  Filled 2024-03-31: qty 1

## 2024-03-31 NOTE — Plan of Care (Signed)

## 2024-03-31 NOTE — Progress Notes (Signed)
   03/31/24 1140  Spiritual Encounters  Type of Visit Initial  Care provided to: Family  Referral source Family  Reason for visit Advance directives  OnCall Visit No   Per spiritual care consult request for assistance with HCPOA, I visited with Mr. Mclester' niece, Zebedee. Mr. Enslin was out of room having biopsy procedure.  Zebedee debriefed with me concerns relating to her uncle and also loss of  his wife last year. Her mother (Sylis's sister) is also coping with illness of spouse, so there are numerous family stressors. Mr. Moten also has a daughter living at home with him.  I offered compassionate presence and active listening. I invited Theresa's to process with me, and I encouraged her self care as she manages care for her family.  Will return to discuss HCPOA but today Mr. Armstrong has effects anesthesia preventing that currently.  Marlea Gambill L. Fredrica, M.Div [phone]

## 2024-03-31 NOTE — Discharge Instructions (Addendum)
 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 You were managed with medications and IV fluids and have significantly improved with treatment. You underwent biopsy, which is pending on discharge. Please follow-up with Dr. Lanny as scheduled. I have recommended for you to hold your losartan since your blood pressure is on the low-normal end of the spectrum and I would not want to push it any lower.

## 2024-03-31 NOTE — Procedures (Signed)
 Interventional Radiology Procedure:   Indications: Bone lesions  Procedure: CT guided bone marrow biopsy and CT guided bone lesion biopsy  Findings: 2 aspirates and 2 cores from right ilium.  Core biopsies from left iliac lytic bone lesion  Complications: None     EBL: Minimal, less than 10 ml  Plan: Bedrest 1 hours   Joe [name] R. Philip, MD  Pager: [phone]

## 2024-03-31 NOTE — Progress Notes (Deleted)
 [facility] [phone]   [phone]  INITIAL CONSULTATION:  Patient Care Team: Merilee Rush as PCP - General (Family Medicine)  CHIEF COMPLAINTS/PURPOSE OF CONSULTATION:  ***   HISTORY OF PRESENTING ILLNESS:  Joe [name] 73 y.o. male with medical history significant for ***  On review of the previous records ***  On exam today ***  MEDICAL HISTORY:  Past Medical History:  Diagnosis Date   Anxiety    Diabetes mellitus without complication (HCC)     SURGICAL HISTORY: No past surgical history on file.  SOCIAL HISTORY: Social History   Socioeconomic History   Marital status: Married    Spouse name: Not on file   Number of children: Not on file   Years of education: Not on file   Highest education level: Not on file  Occupational History   Not on file  Tobacco Use   Smoking status: Never   Smokeless tobacco: Not on file  Substance and Sexual Activity   Alcohol use: No    Alcohol/week: 0.0 standard drinks of alcohol   Drug use: No   Sexual activity: Not on file  Other Topics Concern   Not on file  Social History Narrative   Not on file   Social Drivers of Health   Financial Resource Strain: Not on file  Food Insecurity: No Food Insecurity (03/28/2024)   Hunger Vital Sign    Worried About Running Out of Food in the Last Year: Never true    Ran Out of Food in the Last Year: Never true  Transportation Needs: No Transportation Needs (03/28/2024)   PRAPARE - Administrator, Civil Service (Medical): No    Lack of Transportation (Non-Medical): No  Physical Activity: Not on file  Stress: Not on file  Social Connections: Socially Isolated (03/28/2024)   Social Connection and Isolation Panel    Frequency of Communication with Friends and Family: More than three times a week    Frequency of Social Gatherings with Friends and Family: More than three times a week    Attends Religious Services:  Never    Database administrator or Organizations: No    Attends Banker Meetings: Patient unable to answer    Marital Status: Widowed  Intimate Partner Violence: Not At Risk (03/28/2024)   Humiliation, Afraid, Rape, and Kick questionnaire    Fear of Current or Ex-Partner: No    Emotionally Abused: No    Physically Abused: No    Sexually Abused: No    FAMILY HISTORY: No family history on file.  ALLERGIES:  has no known allergies.  MEDICATIONS:  No current facility-administered medications for this visit.   No current outpatient medications on file.   Facility-Administered Medications Ordered in Other Visits  Medication Dose Route Frequency Provider Last Rate Last Admin   acetaminophen  (TYLENOL ) tablet 1,000 mg  1,000 mg Oral Q6H PRN Segars, Dorn, MD       albuterol  (PROVENTIL ) (2.5 MG/3ML) 0.083% nebulizer solution 2.5 mg  2.5 mg Nebulization Q4H PRN Segars, Dorn, MD       carvedilol  (COREG ) tablet 6.25 mg  6.25 mg Oral BID Segars, Jonathan, MD   6.25 mg at 03/30/24 2056   Chlorhexidine  Gluconate Cloth 2 % PADS 6 each  6 each Topical QHS Keturah Dorn, MD   6 each at 03/30/24 2056   diazepam  (VALIUM ) tablet 2 mg  2 mg Oral BID PRN Segars, Jonathan, MD   2 mg  at 03/29/24 2042   famotidine  (PEPCID ) tablet 20 mg  20 mg Oral Daily Segars, Jonathan, MD   20 mg at 03/30/24 9163   hydrALAZINE  (APRESOLINE ) injection 5 mg  5 mg Intravenous Q6H PRN Chavez, Abigail, NP       HYDROmorphone  (DILAUDID ) injection 0.5 mg  0.5 mg Intravenous Q4H PRN Segars, Jonathan, MD   0.5 mg at 03/30/24 0745   melatonin tablet 6 mg  6 mg Oral QHS PRN Segars, Jonathan, MD   6 mg at 03/30/24 2057   ondansetron  (ZOFRAN ) injection 4 mg  4 mg Intravenous Q6H PRN Keturah Carrier, MD       Oral care mouth rinse  15 mL Mouth Rinse PRN Segars, Carrier, MD       oxyCODONE  (Oxy IR/ROXICODONE ) immediate release tablet 2.5 mg  2.5 mg Oral Q4H PRN Keturah Carrier, MD       Or   oxyCODONE  (Oxy  IR/ROXICODONE ) immediate release tablet 5 mg  5 mg Oral Q4H PRN Segars, Jonathan, MD   5 mg at 03/30/24 2125   pantoprazole  (PROTONIX ) EC tablet 40 mg  40 mg Oral Daily Segars, Jonathan, MD   40 mg at 03/30/24 0835   polyethylene glycol (MIRALAX  / GLYCOLAX ) packet 17 g  17 g Oral BID Briana Elgin LABOR, MD   17 g at 03/30/24 9164   rosuvastatin  (CRESTOR ) tablet 10 mg  10 mg Oral Daily Segars, Jonathan, MD   10 mg at 03/30/24 9163   senna-docusate (Senokot-S) tablet 1 tablet  1 tablet Oral BID Briana Elgin LABOR, MD   1 tablet at 03/30/24 9164   sodium chloride  (OCEAN) 0.65 % nasal spray 1 spray  1 spray Each Nare PRN Briana Elgin LABOR, MD   1 spray at 03/31/24 0813   sodium chloride  flush (NS) 0.9 % injection 3 mL  3 mL Intravenous Q12H Keturah Carrier, MD   3 mL at 03/30/24 2057    REVIEW OF SYSTEMS:   All other systems are reviewed and are negative for acute change except as noted in the HPI.  PHYSICAL EXAMINATION: ECOG PERFORMANCE STATUS: {CHL ONC ECOG PS:[id number]}  There were no vitals filed for this visit. There were no vitals filed for this visit.  Physical Exam    LABORATORY DATA:  I have reviewed the data as listed    Latest Ref Rng & Units 03/31/2024    4:51 AM 03/29/2024    2:42 AM 03/28/2024    6:20 PM  CBC  WBC 4.0 - 10.5 K/uL 6.8  7.2  9.7   Hemoglobin 13.0 - 17.0 g/dL 87.9  86.6  85.2   Hematocrit 39.0 - 52.0 % 37.0  42.1  46.3   Platelets 150 - 400 K/uL 221  222  186        Latest Ref Rng & Units 03/31/2024    4:51 AM 03/30/2024    2:46 AM 03/29/2024    2:32 PM  CMP  Glucose 70 - 99 mg/dL 97  99  885   BUN 8 - 23 mg/dL 22  26  27    Creatinine 0.61 - 1.24 mg/dL 9.02  8.89  8.91   Sodium 135 - 145 mmol/L 143  145  141   Potassium 3.5 - 5.1 mmol/L 3.2  3.8  3.0   Chloride 98 - 111 mmol/L 107  109  104   CO2 22 - 32 mmol/L 27  28  30    Calcium  8.9 - 10.3 mg/dL 9.6  88.8  88.1  RADIOGRAPHIC STUDIES: I have personally reviewed the radiological images as listed and  agreed with the findings in the report. MR CERVICAL SPINE WO CONTRAST Result Date: 03/29/2024 CLINICAL DATA:  Provided history: Bilateral hand paresthesia. History of osseous metastases. Evaluate for compressive cervical spine lesion. EXAM: MRI CERVICAL SPINE WITHOUT CONTRAST TECHNIQUE: Multiplanar, multisequence MR imaging of the cervical spine was performed. No intravenous contrast was administered. COMPARISON:  None. FINDINGS: Alignment: No significant spondylolisthesis. Vertebrae: Multifocal signal abnormality within the cervical spine and within visible portions of the skull base, calvarium, thoracic spine and ribs consistent with widespread osseous metastatic disease. Notably, there are metastases within the C4 and C6 vertebrae which involve the superior endplates at these levels (for instance as seen on series 6, images 8 and 9). Cord: No signal abnormality identified within the cervical spinal cord. Posterior Fossa, vertebral arteries, paraspinal tissues: Flow voids preserved within visible portions of the cervical vertebral arteries. No paraspinal mass or collection. Disc levels: No more than mild disc degeneration within the cervical spine. C2-C3: No significant disc herniation or spinal canal stenosis. Uncovertebral hypertrophy on the right resulting in moderate right neural foraminal narrowing. C3-C4: Small central disc protrusion. Uncovertebral hypertrophy on the right. The disc protrusion minimally effaces the thecal sac (without significant spinal canal stenosis). Mild right neural foraminal narrowing. C4-C5: Slight disc bulge. Mild facet hypertrophy on the left. Slight ligamentum flavum thickening. No significant spinal canal stenosis. Mild left neural foraminal narrowing. C5-C6: Slight disc bulge. Minimal facet hypertrophy. Mild ligamentum flavum thickening. No significant spinal canal or neural foraminal narrowing. C6-C7: Slight disc bulge. Uncovertebral hypertrophy (greater on the left). No  significant spinal canal stenosis. Moderate left neural foraminal narrowing. C7-T1: No significant disc herniation or stenosis. IMPRESSION: 1. 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 Of note, there are metastases within the C4 and C6 vertebrae which involve the superior endplates at these levels. No tumor encroachment upon the cervical spinal canal or neural foramina. 2. Cervical spondylosis as outlined within the body of the report. No significant spinal canal stenosis. Multilevel neural foraminal stenosis, greatest on the right at C2-C3 and on the left at C6-C7 (moderate at these sites). Electronically Signed   By: Rockey Childs D.O.   On: 03/29/2024 14:31   MR ABDOMEN WWO CONTRAST Result Date: 03/28/2024 CLINICAL DATA:  New diagnosis of multiple bone metastases. Screening for primary neoplastic process. EXAM: MRI ABDOMEN AND PELVIS WITHOUT AND WITH CONTRAST TECHNIQUE: Multiplanar multisequence MR imaging of the abdomen and pelvis was performed both before and after the administration of intravenous contrast. CONTRAST:  7mL GADAVIST  GADOBUTROL  1 MMOL/ML IV SOLN COMPARISON:  None Available. FINDINGS: COMBINED FINDINGS FOR BOTH MR ABDOMEN AND PELVIS Lower chest: Unremarkable MR appearance to the lung bases. No pleural effusion. No pericardial effusion. Normal heart size. Hepatobiliary: The liver is normal in size and configuration. No intrahepatic or extrahepatic bile duct dilatation. No choledocholithiasis. Small volume dependent gallstones noted without imaging signs of acute cholecystitis. Pancreas: No mass, inflammatory changes or other parenchymal abnormality identified. No main pancreatic duct dilation. Spleen:  Within normal limits in size and appearance. No focal mass. Adrenals/Urinary Tract: Unremarkable adrenal glands. No hydroureteronephrosis. Simple cyst noted in the right kidney lower pole  measuring 1.5 x 1.6 cm. There is a smaller simple cyst in the left kidney upper pole, laterally. Stomach/Bowel: Visualized portions within the abdomen are unremarkable. No disproportionate dilation of bowel loops. Multiple colonic diverticula  noted without diverticulitis. Vascular/Lymphatic: No pathologically enlarged lymph nodes identified. No abdominal aortic aneurysm demonstrated. No ascites. Reproductive: The prostate is not well evaluated on this exam. However, there is heterogeneous transitional zone, especially near the apex. Correlation with serum PSA is recommended to determine the need for dedicated pelvic MRI as per prostate protocol. Other:  None. Musculoskeletal: There are extensive mixed T2 hypointense and hyperintense lesions throughout the imaged bones compatible with metastases. There are several rib lesions with associated soft tissue component, as seen on the coronal images, better evaluated on the recent chest CT scan. IMPRESSION: 1. Extensive osseous metastases. There are several rib lesions with associated soft tissue component, better evaluated on the recent chest CT scan. 2. No primary neoplastic process identified in the abdomen or pelvis. 3. Heterogeneous prostate transitional zone, especially near the apex. Correlation with serum PSA is recommended to determine the need for dedicated pelvic MRI as per prostate protocol. 4. Cholelithiasis without imaging signs of acute cholecystitis. 5. Multiple other nonacute observations, as described above. Electronically Signed   By: Ree Molt M.D.   On: 03/28/2024 18:53   MR PELVIS W WO CONTRAST Result Date: 03/28/2024 CLINICAL DATA:  New diagnosis of multiple bone metastases. Screening for primary neoplastic process. EXAM: MRI ABDOMEN AND PELVIS WITHOUT AND WITH CONTRAST TECHNIQUE: Multiplanar multisequence MR imaging of the abdomen and pelvis was performed both before and after the administration of intravenous contrast. CONTRAST:  7mL  GADAVIST  GADOBUTROL  1 MMOL/ML IV SOLN COMPARISON:  None Available. FINDINGS: COMBINED FINDINGS FOR BOTH MR ABDOMEN AND PELVIS Lower chest: Unremarkable MR appearance to the lung bases. No pleural effusion. No pericardial effusion. Normal heart size. Hepatobiliary: The liver is normal in size and configuration. No intrahepatic or extrahepatic bile duct dilatation. No choledocholithiasis. Small volume dependent gallstones noted without imaging signs of acute cholecystitis. Pancreas: No mass, inflammatory changes or other parenchymal abnormality identified. No main pancreatic duct dilation. Spleen:  Within normal limits in size and appearance. No focal mass. Adrenals/Urinary Tract: Unremarkable adrenal glands. No hydroureteronephrosis. Simple cyst noted in the right kidney lower pole measuring 1.5 x 1.6 cm. There is a smaller simple cyst in the left kidney upper pole, laterally. Stomach/Bowel: Visualized portions within the abdomen are unremarkable. No disproportionate dilation of bowel loops. Multiple colonic diverticula noted without diverticulitis. Vascular/Lymphatic: No pathologically enlarged lymph nodes identified. No abdominal aortic aneurysm demonstrated. No ascites. Reproductive: The prostate is not well evaluated on this exam. However, there is heterogeneous transitional zone, especially near the apex. Correlation with serum PSA is recommended to determine the need for dedicated pelvic MRI as per prostate protocol. Other:  None. Musculoskeletal: There are extensive mixed T2 hypointense and hyperintense lesions throughout the imaged bones compatible with metastases. There are several rib lesions with associated soft tissue component, as seen on the coronal images, better evaluated on the recent chest CT scan. IMPRESSION: 1. Extensive osseous metastases. There are several rib lesions with associated soft tissue component, better evaluated on the recent chest CT scan. 2. No primary neoplastic process identified  in the abdomen or pelvis. 3. Heterogeneous prostate transitional zone, especially near the apex. Correlation with serum PSA is recommended to determine the need for dedicated pelvic MRI as per prostate protocol. 4. Cholelithiasis without imaging signs of acute cholecystitis. 5. Multiple other nonacute observations, as described above. Electronically Signed   By: Ree Molt M.D.   On: 03/28/2024 18:53   CT CHEST W CONTRAST Result Date: 03/26/2024 CLINICAL DATA:  Abnormal chest x-ray. Pain in  left side of lungs. * Tracking Code: BO * EXAM: CT CHEST WITH CONTRAST TECHNIQUE: Multidetector CT imaging of the chest was performed during intravenous contrast administration. RADIATION DOSE REDUCTION: This exam was performed according to the departmental dose-optimization program which includes automated exposure control, adjustment of the mA and/or kV according to patient size and/or use of iterative reconstruction technique. CONTRAST:  100mL ISOVUE -300 IOPAMIDOL  (ISOVUE -300) INJECTION 61% COMPARISON:  None Available. FINDINGS: Cardiovascular: Normal cardiac size. No pericardial effusion. No aortic aneurysm. There are coronary artery calcifications, in keeping with coronary artery disease. There are also mild peripheral atherosclerotic vascular calcifications of thoracic aorta and its major branches. Mediastinum/Nodes: Visualized thyroid  gland appears grossly unremarkable. No solid / cystic mediastinal masses. The esophagus is nondistended precluding optimal assessment. No axillary, mediastinal or hilar lymphadenopathy by size criteria. Lungs/Pleura: The central tracheo-bronchial tree is patent. No mass or consolidation. No pleural effusion or pneumothorax. No suspicious lung nodules. Upper Abdomen: Small volume calcified gallstones noted without imaging signs of acute cholecystitis. Remaining visualized upper abdominal viscera within normal limits. Musculoskeletal: The visualized soft tissues of the chest wall are  grossly unremarkable. There are extensive lytic lesions throughout the imaged bones, compatible with metastases. There is also associated destruction as well as expansile lytic lesions with soft tissue in several ribs including largest lesion along the anterolateral right fourth rib with soft tissue measuring up to 2.0 x 2.9 cm. There are mild multilevel degenerative changes in the visualized spine. IMPRESSION: 1. There are extensive lytic lesions throughout the imaged bones, compatible with metastases. There is also associated destruction as well as expansile lytic lesions with soft tissue in several ribs including largest lesion along the anterolateral right fourth rib with soft tissue measuring up to 2.0 x 2.9 cm. 2. No lung mass, consolidation, pleural effusion or pneumothorax. 3. Multiple other nonacute observations, as described above. Aortic Atherosclerosis (ICD10-I70.0). Electronically Signed   By: Ree Molt M.D.   On: 03/26/2024 16:32   DG Chest 2 View Result Date: 03/25/2024 CLINICAL DATA:  Chest pain for 3 weeks. EXAM: CHEST - 2 VIEW COMPARISON:  None Available. FINDINGS: The heart size and mediastinal contours are within normal limits. There appears to be a pleural based mass in the right midlung. There is also the suggestion of possible lytic lesion involving the right eighth rib laterally as well as possible other lytic lesions involving the right ribs and possibly scapula. No definite consolidative process is noted. IMPRESSION: Probable pleural based mass seen in right midlung with possible lytic lesions involving multiple right ribs and right scapula. CT scan of the chest is recommended for further evaluation. These results will be called to the ordering clinician or representative by the Radiologist Assistant, and communication documented in the PACS or zVision Dashboard. Electronically Signed   By: Lynwood Landy Raddle M.D.   On: 03/25/2024 17:03    ASSESSMENT & PLAN NICKSON MIDDLESWORTH is a 73  y.o. male presenting [facility] for consultation regarding ***. Patient will proceed with laboratory workup today.     #Age related screenings -  -Patient will RTC when work up is complete.  Patient expressed understanding of the recommended workup and is agreeable to move forward.   All questions were answered. The patient knows to call the clinic with any problems, questions or concerns.  Shared visit with Dr. PIERRETTE  No orders of the defined types were placed in this encounter.     I have spent a total of {CHL ONC  TIME VISIT - DTPQU:[id number]} minutes of face-to-face and non-face-to-face time, preparing to see the patient, obtaining and/or reviewing separately obtained history, performing a medically appropriate examination, counseling and educating the patient, ordering medications/tests/procedures, referring and communicating with other health care professionals, documenting clinical information in the electronic health record, independently interpreting results and communicating results to the patient, and care coordination.   Zurisadai Helminiak Walisiewicz PA-C Department of Hematology/Oncology [facility] at [facility] Phone: [phone]

## 2024-03-31 NOTE — Progress Notes (Signed)
 Nurse reviewed discharge instructions with pt. Pt verbalized understanding of discharge instructions, follow up appointments and new medications.  Joe [name] was delivered to pt room yesterday and family is taking home with them upon discharge.

## 2024-03-31 NOTE — Discharge Summary (Signed)
 Physician Discharge Summary   Patient: Joe [name] MRN: [id number] DOB: 1951-04-24  Admit date:     03/28/2024  Discharge date: 03/31/24  Discharge Physician: Elgin Lam, MD   PCP: Merilee Rush   Recommendations at discharge:  PCP visit for hospital follow-up Medical oncology visit for hospital follow-up Biopsy results Follow-up UPEP, SPEP, Kappa/lambda light chains, beta 2 microglobulin Follow-up 1,25 vitamin D , PTH, PTHrP  Discharge Diagnoses: Principal Problem:   Hypercalcemia of malignancy Active Problems:   Lytic lesion of bone on x-ray   Cancer related pain  Resolved Problems:   * No resolved hospital problems. *  Hospital Course: Joe [name] is a 73 y.o. male with a history of diabetes mellitus type 2, hypertension, hyperlipidemia, NASH cirrhosis, neuropathy, GERD.  Patient presented secondary to abnormal labs and imaging with evidence of hypercalcemia and lytic bone lesions concerning for metastatic disease. Patient started on IV fluids and given Zometa  and calcitonin. Medical oncology consulted. Bone and bone marrow biopsies performed prior to discharge.  Assessment and Plan:  Hypercalcemia Presumed secondary to malignancy. Serum calcium  measured at >15 on admission. Ionized calcium  level not obtained; albumin within normal range. Patient started on IV fluids and calcitonin. Given one dose of zoledronic  acid. 25-vitamin D  normal. Calcium  down to 9.6 with treatment. Patient to follow-up with medical oncology as an outpatient. PTH, PTHrP, 1,25 vitamin D  pending on discharge.   Mixed lytic lesions Noted incidentally. Patient with imaging evidence of extensive osseous metastasis; specifically, several rib lesions with associated soft tissue component. IR consulted for bone biopsy. MRI cervical spine shows increased number of identified lytic lesions located in the skull and cervical spine. Multiple myeloma workup pending on discharge. Patient underwent bone and bone  marrow biopsies on 8/4, prior to discharge.   AKI stage 2 Previous baseline creatinine of 0.71 from June 2025. Creatinine of 1.28 on admission. AKI resolved with IV fluids.   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 PSA obtained and is 2.41.   Left hand paresthesia Extension to forearm. Patient also reports similar symptoms of right hand. Prior history of neuropathy. MRI cervical spine and significant for C6-C7 foraminal stenosis which may be contributory. Symptoms are intermittent and patient without associated weakness.   Hypernatremia Secondary to NS IV fluids. -Improved after change in IV fluid rate   Diabetes mellitus type 2 Well controlled based on last hemoglobin A1C of 5.3% from June 2025. Patient is on Jardiance and Ozempic as an outpatient, which were held on admission. Hemoglobin A1C pending on discharge. Hold Ozempic on discharge.   Primary hypertension Patient is on Coreg  and losartan as an outpatient. Continue Coreg . Losartan held secondary to low-normal blood pressure on discharge.   Hyperlipidemia Continue Crestor    NASH cirrhosis Noted. Compensated.   Neuropathy Noted. Not on outpatient medication management.   GERD Continue Nexium and Pepcid .   Anxiety Continue diazepam .   Left foot drop Cervical spine MRI without spinal cord pathology.    Consultants: Medical oncology Procedures performed: Bone/bone marrow biopsy  Disposition: Home Diet recommendation: Regular diet   DISCHARGE MEDICATION: Allergies as of 03/31/2024   No Known Allergies      Medication List     PAUSE taking these medications    losartan 100 MG tablet Wait to take this until your doctor or other care provider tells you to start again. Commonly known as: COZAAR Take 50 mg by mouth daily.   Ozempic (2 MG/DOSE) 8 MG/3ML Sopn Wait to take this until your doctor or other  care provider tells you to start again. Generic drug: Semaglutide (2 MG/DOSE) Inject 2 mg into the skin  every Saturday.       TAKE these medications    carvedilol  6.25 MG tablet Commonly known as: COREG  Take 6.25 mg by mouth 2 (two) times daily with a meal.   diazepam  2 MG tablet Commonly known as: VALIUM  Take 2 mg by mouth in the morning and at bedtime.   esomeprazole 40 MG capsule Commonly known as: NEXIUM Take 40 mg by mouth daily before breakfast.   famotidine  20 MG tablet Commonly known as: PEPCID  Take 20 mg by mouth at bedtime.   Flonase Allergy Relief 50 MCG/ACT nasal spray Generic drug: fluticasone Place 1-2 sprays into both nostrils in the morning and at bedtime.   Jardiance 10 MG Tabs tablet Generic drug: empagliflozin Take 10 mg by mouth daily.   multivitamin tablet Take 1 tablet by mouth daily with breakfast.   OneTouch Verio test strip Generic drug: glucose blood   oxyCODONE  5 MG immediate release tablet Commonly known as: Oxy IR/ROXICODONE  Take 1 tablet (5 mg total) by mouth every 6 (six) hours as needed for severe pain (pain score 7-10).   rosuvastatin  10 MG tablet Commonly known as: CRESTOR  Take 10 mg by mouth daily.   sildenafil 100 MG tablet Commonly known as: VIAGRA Take 100 mg by mouth daily as needed (for E.D.).        Follow-up Information     [facility], [facility] Follow up.   Specialty: Home Health Services Contact information: [address] [place] [address] [phone]         Merilee Rush. Schedule an appointment as soon as possible for a visit in 1 week(s).   Specialty: Family Medicine Why: For hospital follow-up Contact information: [address] [place] [address] [phone]                Discharge Exam: BP 110/66 (BP Location: Left Arm)   Pulse 85   Temp 97.7 F (36.5 C) (Oral)   Resp 16   Ht 5' 7 (1.702 m)   Wt 75.1 kg   SpO2 95%   BMI 25.93 kg/m   General exam: Appears calm and comfortable Respiratory system: Clear to auscultation. Respiratory effort  normal. Cardiovascular system: S1 & S2 heard, RRR. No murmurs. Gastrointestinal system: Abdomen is nondistended, soft and nontender. Normal bowel sounds heard. Central nervous system: Alert and oriented. No focal neurological deficits. Psychiatry: Judgement and insight appear normal. Mood & affect appropriate.   Condition at discharge: stable  The results of significant diagnostics from this hospitalization (including imaging, microbiology, ancillary and laboratory) are listed below for reference.   Imaging Studies: CT BONE MARROW BIOPSY Result Date: 03/31/2024 INDICATION: 73 year old with multiple lytic bone lesions. Request for bone marrow biopsy and bone lesion biopsy. EXAM: CT GUIDED BONE MARROW ASPIRATES AND BIOPSY CT-GUIDED CORE BIOPSY OF LEFT ILIAC BONE LESION Physician: Juliene SAUNDERS. Henn, MD MEDICATIONS: Moderate sedation ANESTHESIA/SEDATION: Moderate (conscious) sedation was employed during this procedure. A total of Versed  2 mg and fentanyl  75 mcg was administered intravenously at the order of the provider performing the procedure. Total intra-service moderate sedation time: 29 minutes. Patient's level of consciousness and vital signs were monitored continuously by radiology nurse throughout the procedure under the supervision of the provider performing the procedure. COMPLICATIONS: None immediate. PROCEDURE: The procedure was explained to the patient. The risks and benefits of the procedure were discussed and the patient's  questions were addressed. Informed consent was obtained from the patient. The patient was placed prone on CT table. Images of the pelvis were obtained. The back was prepped and draped in sterile fashion. Initially, attention was directed to a lytic lesion in the left iliac bone. Left side of the back was anesthetized with 1% lidocaine . Small incision was made. Using CT guidance, 17 gauge coaxial needle was directed into the lytic bone lesion. Several core biopsies were obtained  with 18 gauge core device. Specimens placed in formalin. Attention was directed to the bone marrow biopsy. The skin and right posterior ilium were anesthetized with 1% lidocaine . 11 gauge bone needle was directed into the right ilium with CT guidance. Two aspirates and 2 core biopsies were obtained. Bandage placed over the puncture sites. FINDINGS: Core biopsies were obtained from a destructive lytic lesion in the upper left posterior iliac bone. Biopsy needle confirmed within the lesion. Bone marrow biopsy needle directed into the posterior right ilium. IMPRESSION: 1. CT-guided core biopsy of a lytic bone lesion in the left ilium. 2. CT-guided bone marrow aspiration and biopsy from the right iliac bone. Electronically Signed   By: Juliene Balder M.D.   On: 03/31/2024 14:29   CT BIOPSY Result Date: 03/31/2024 INDICATION: 73 year old with multiple lytic bone lesions. Request for bone marrow biopsy and bone lesion biopsy. EXAM: CT GUIDED BONE MARROW ASPIRATES AND BIOPSY CT-GUIDED CORE BIOPSY OF LEFT ILIAC BONE LESION Physician: Juliene SAUNDERS. Henn, MD MEDICATIONS: Moderate sedation ANESTHESIA/SEDATION: Moderate (conscious) sedation was employed during this procedure. A total of Versed  2 mg and fentanyl  75 mcg was administered intravenously at the order of the provider performing the procedure. Total intra-service moderate sedation time: 29 minutes. Patient's level of consciousness and vital signs were monitored continuously by radiology nurse throughout the procedure under the supervision of the provider performing the procedure. COMPLICATIONS: None immediate. PROCEDURE: The procedure was explained to the patient. The risks and benefits of the procedure were discussed and the patient's questions were addressed. Informed consent was obtained from the patient. The patient was placed prone on CT table. Images of the pelvis were obtained. The back was prepped and draped in sterile fashion. Initially, attention was directed to a  lytic lesion in the left iliac bone. Left side of the back was anesthetized with 1% lidocaine . Small incision was made. Using CT guidance, 17 gauge coaxial needle was directed into the lytic bone lesion. Several core biopsies were obtained with 18 gauge core device. Specimens placed in formalin. Attention was directed to the bone marrow biopsy. The skin and right posterior ilium were anesthetized with 1% lidocaine . 11 gauge bone needle was directed into the right ilium with CT guidance. Two aspirates and 2 core biopsies were obtained. Bandage placed over the puncture sites. FINDINGS: Core biopsies were obtained from a destructive lytic lesion in the upper left posterior iliac bone. Biopsy needle confirmed within the lesion. Bone marrow biopsy needle directed into the posterior right ilium. IMPRESSION: 1. CT-guided core biopsy of a lytic bone lesion in the left ilium. 2. CT-guided bone marrow aspiration and biopsy from the right iliac bone. Electronically Signed   By: Juliene Balder M.D.   On: 03/31/2024 14:29   MR CERVICAL SPINE WO CONTRAST Result Date: 03/29/2024 CLINICAL DATA:  Provided history: Bilateral hand paresthesia. History of osseous metastases. Evaluate for compressive cervical spine lesion. EXAM: MRI CERVICAL SPINE WITHOUT CONTRAST TECHNIQUE: Multiplanar, multisequence MR imaging of the cervical spine was performed. No intravenous contrast was administered. COMPARISON:  None. FINDINGS: Alignment: No significant spondylolisthesis. Vertebrae: Multifocal signal abnormality within the cervical spine and within visible portions of the skull base, calvarium, thoracic spine and ribs consistent with widespread osseous metastatic disease. Notably, there are metastases within the C4 and C6 vertebrae which involve the superior endplates at these levels (for instance as seen on series 6, images 8 and 9). Cord: No signal abnormality identified within the cervical spinal cord. Posterior Fossa, vertebral arteries,  paraspinal tissues: Flow voids preserved within visible portions of the cervical vertebral arteries. No paraspinal mass or collection. Disc levels: No more than mild disc degeneration within the cervical spine. C2-C3: No significant disc herniation or spinal canal stenosis. Uncovertebral hypertrophy on the right resulting in moderate right neural foraminal narrowing. C3-C4: Small central disc protrusion. Uncovertebral hypertrophy on the right. The disc protrusion minimally effaces the thecal sac (without significant spinal canal stenosis). Mild right neural foraminal narrowing. C4-C5: Slight disc bulge. Mild facet hypertrophy on the left. Slight ligamentum flavum thickening. No significant spinal canal stenosis. Mild left neural foraminal narrowing. C5-C6: Slight disc bulge. Minimal facet hypertrophy. Mild ligamentum flavum thickening. No significant spinal canal or neural foraminal narrowing. C6-C7: Slight disc bulge. Uncovertebral hypertrophy (greater on the left). No significant spinal canal stenosis. Moderate left neural foraminal narrowing. C7-T1: No significant disc herniation or stenosis. IMPRESSION: 1. 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 Of note, there are metastases within the C4 and C6 vertebrae which involve the superior endplates at these levels. No tumor encroachment upon the cervical spinal canal or neural foramina. 2. Cervical spondylosis as outlined within the body of the report. No significant spinal canal stenosis. Multilevel neural foraminal stenosis, greatest on the right at C2-C3 and on the left at C6-C7 (moderate at these sites). Electronically Signed   By: Rockey Childs D.O.   On: 03/29/2024 14:31   MR ABDOMEN WWO CONTRAST Result Date: 03/28/2024 CLINICAL DATA:  New diagnosis of multiple bone metastases. Screening for primary neoplastic process. EXAM: MRI ABDOMEN AND PELVIS WITHOUT  AND WITH CONTRAST TECHNIQUE: Multiplanar multisequence MR imaging of the abdomen and pelvis was performed both before and after the administration of intravenous contrast. CONTRAST:  7mL GADAVIST  GADOBUTROL  1 MMOL/ML IV SOLN COMPARISON:  None Available. FINDINGS: COMBINED FINDINGS FOR BOTH MR ABDOMEN AND PELVIS Lower chest: Unremarkable MR appearance to the lung bases. No pleural effusion. No pericardial effusion. Normal heart size. Hepatobiliary: The liver is normal in size and configuration. No intrahepatic or extrahepatic bile duct dilatation. No choledocholithiasis. Small volume dependent gallstones noted without imaging signs of acute cholecystitis. Pancreas: No mass, inflammatory changes or other parenchymal abnormality identified. No main pancreatic duct dilation. Spleen:  Within normal limits in size and appearance. No focal mass. Adrenals/Urinary Tract: Unremarkable adrenal glands. No hydroureteronephrosis. Simple cyst noted in the right kidney lower pole measuring 1.5 x 1.6 cm. There is a smaller simple cyst in the left kidney upper pole, laterally. Stomach/Bowel: Visualized portions within the abdomen are unremarkable. No disproportionate dilation of bowel loops. Multiple colonic diverticula noted without diverticulitis. Vascular/Lymphatic: No pathologically enlarged lymph nodes identified. No abdominal aortic aneurysm demonstrated. No ascites. Reproductive: The prostate is not well evaluated on this exam. However, there is heterogeneous transitional zone, especially near the apex. Correlation with serum PSA is recommended to determine the need for dedicated pelvic MRI as per prostate protocol. Other:  None. Musculoskeletal: There are extensive mixed T2 hypointense and hyperintense lesions throughout the  imaged bones compatible with metastases. There are several rib lesions with associated soft tissue component, as seen on the coronal images, better evaluated on the recent chest CT scan. IMPRESSION: 1.  Extensive osseous metastases. There are several rib lesions with associated soft tissue component, better evaluated on the recent chest CT scan. 2. No primary neoplastic process identified in the abdomen or pelvis. 3. Heterogeneous prostate transitional zone, especially near the apex. Correlation with serum PSA is recommended to determine the need for dedicated pelvic MRI as per prostate protocol. 4. Cholelithiasis without imaging signs of acute cholecystitis. 5. Multiple other nonacute observations, as described above. Electronically Signed   By: Ree Molt M.D.   On: 03/28/2024 18:53   MR PELVIS W WO CONTRAST Result Date: 03/28/2024 CLINICAL DATA:  New diagnosis of multiple bone metastases. Screening for primary neoplastic process. EXAM: MRI ABDOMEN AND PELVIS WITHOUT AND WITH CONTRAST TECHNIQUE: Multiplanar multisequence MR imaging of the abdomen and pelvis was performed both before and after the administration of intravenous contrast. CONTRAST:  7mL GADAVIST  GADOBUTROL  1 MMOL/ML IV SOLN COMPARISON:  None Available. FINDINGS: COMBINED FINDINGS FOR BOTH MR ABDOMEN AND PELVIS Lower chest: Unremarkable MR appearance to the lung bases. No pleural effusion. No pericardial effusion. Normal heart size. Hepatobiliary: The liver is normal in size and configuration. No intrahepatic or extrahepatic bile duct dilatation. No choledocholithiasis. Small volume dependent gallstones noted without imaging signs of acute cholecystitis. Pancreas: No mass, inflammatory changes or other parenchymal abnormality identified. No main pancreatic duct dilation. Spleen:  Within normal limits in size and appearance. No focal mass. Adrenals/Urinary Tract: Unremarkable adrenal glands. No hydroureteronephrosis. Simple cyst noted in the right kidney lower pole measuring 1.5 x 1.6 cm. There is a smaller simple cyst in the left kidney upper pole, laterally. Stomach/Bowel: Visualized portions within the abdomen are unremarkable. No  disproportionate dilation of bowel loops. Multiple colonic diverticula noted without diverticulitis. Vascular/Lymphatic: No pathologically enlarged lymph nodes identified. No abdominal aortic aneurysm demonstrated. No ascites. Reproductive: The prostate is not well evaluated on this exam. However, there is heterogeneous transitional zone, especially near the apex. Correlation with serum PSA is recommended to determine the need for dedicated pelvic MRI as per prostate protocol. Other:  None. Musculoskeletal: There are extensive mixed T2 hypointense and hyperintense lesions throughout the imaged bones compatible with metastases. There are several rib lesions with associated soft tissue component, as seen on the coronal images, better evaluated on the recent chest CT scan. IMPRESSION: 1. Extensive osseous metastases. There are several rib lesions with associated soft tissue component, better evaluated on the recent chest CT scan. 2. No primary neoplastic process identified in the abdomen or pelvis. 3. Heterogeneous prostate transitional zone, especially near the apex. Correlation with serum PSA is recommended to determine the need for dedicated pelvic MRI as per prostate protocol. 4. Cholelithiasis without imaging signs of acute cholecystitis. 5. Multiple other nonacute observations, as described above. Electronically Signed   By: Ree Molt M.D.   On: 03/28/2024 18:53   CT CHEST W CONTRAST Result Date: 03/26/2024 CLINICAL DATA:  Abnormal chest x-ray. Pain in left side of lungs. * Tracking Code: BO * EXAM: CT CHEST WITH CONTRAST TECHNIQUE: Multidetector CT imaging of the chest was performed during intravenous contrast administration. RADIATION DOSE REDUCTION: This exam was performed according to the departmental dose-optimization program which includes automated exposure control, adjustment of the mA and/or kV according to patient size and/or use of iterative reconstruction technique. CONTRAST:   ISOVUE -300 IOPAMIDOL  (ISOVUE -300) INJECTION 61%  COMPARISON:  None Available. FINDINGS: Cardiovascular: Normal cardiac size. No pericardial effusion. No aortic aneurysm. There are coronary artery calcifications, in keeping with coronary artery disease. There are also mild peripheral atherosclerotic vascular calcifications of thoracic aorta and its major branches. Mediastinum/Nodes: Visualized thyroid  gland appears grossly unremarkable. No solid / cystic mediastinal masses. The esophagus is nondistended precluding optimal assessment. No axillary, mediastinal or hilar lymphadenopathy by size criteria. Lungs/Pleura: The central tracheo-bronchial tree is patent. No mass or consolidation. No pleural effusion or pneumothorax. No suspicious lung nodules. Upper Abdomen: Small volume calcified gallstones noted without imaging signs of acute cholecystitis. Remaining visualized upper abdominal viscera within normal limits. Musculoskeletal: The visualized soft tissues of the chest wall are grossly unremarkable. There are extensive lytic lesions throughout the imaged bones, compatible with metastases. There is also associated destruction as well as expansile lytic lesions with soft tissue in several ribs including largest lesion along the anterolateral right fourth rib with soft tissue measuring up to 2.0 x 2.9 cm. There are mild multilevel degenerative changes in the visualized spine. IMPRESSION: 1. There are extensive lytic lesions throughout the imaged bones, compatible with metastases. There is also associated destruction as well as expansile lytic lesions with soft tissue in several ribs including largest lesion along the anterolateral right fourth rib with soft tissue measuring up to 2.0 x 2.9 cm. 2. No lung mass, consolidation, pleural effusion or pneumothorax. 3. Multiple other nonacute observations, as described above. Aortic Atherosclerosis (ICD10-I70.0). Electronically Signed   By: Ree Molt M.D.   On: 03/26/2024  16:32   DG Chest 2 View Result Date: 03/25/2024 CLINICAL DATA:  Chest pain for 3 weeks. EXAM: CHEST - 2 VIEW COMPARISON:  None Available. FINDINGS: The heart size and mediastinal contours are within normal limits. There appears to be a pleural based mass in the right midlung. There is also the suggestion of possible lytic lesion involving the right eighth rib laterally as well as possible other lytic lesions involving the right ribs and possibly scapula. No definite consolidative process is noted. IMPRESSION: Probable pleural based mass seen in right midlung with possible lytic lesions involving multiple right ribs and right scapula. CT scan of the chest is recommended for further evaluation. These results will be called to the ordering clinician or representative by the Radiologist Assistant, and communication documented in the PACS or zVision Dashboard. Electronically Signed   By: Lynwood Landy Raddle M.D.   On: 03/25/2024 17:03    Microbiology: Results for orders placed or performed during the hospital encounter of 03/28/24  MRSA Next Gen by PCR, Nasal     Status: None   Collection Time: 03/28/24  5:43 PM   Specimen: Nasal Mucosa; Nasal Swab  Result Value Ref Range Status   MRSA by PCR Next Gen NOT DETECTED NOT DETECTED Final    Comment: (NOTE) The GeneXpert MRSA Assay (FDA approved for NASAL specimens only), is one component of a comprehensive MRSA colonization surveillance program. It is not intended to diagnose MRSA infection nor to guide or monitor treatment for MRSA infections. Test performance is not FDA approved in patients less than 63 years old. Performed at [facility], [address], [place], [place] [address]     Labs: CBC: Recent Labs  Lab 03/28/24 1820 03/29/24 0242 03/31/24 0451  WBC 9.7 7.2 6.8  NEUTROABS 5.9  --  4.1  HGB 14.7 13.3 12.0*  HCT 46.3 42.1 37.0*  MCV 103.8* 104.0* 102.8*  PLT 186 222 221   Basic  Metabolic Panel: Recent Labs  Lab  03/28/24 2119 03/29/24 0242 03/29/24 0749 03/29/24 1123 03/29/24 1432 03/30/24 0246 03/31/24 0451  NA  --  146* 148* 146* 141 145 143  K  --  3.6 4.2  --  3.0* 3.8 3.2*  CL  --  107 110  --  104 109 107  CO2  --  32 29  --  30 28 27   GLUCOSE  --  105* 119*  --  114* 99 97  BUN  --  26* 25*  --  27* 26* 22  CREATININE  --  1.13 1.08  --  1.08 1.10 0.97  CALCIUM  15.0* 13.4* 12.0*  --  11.8* 11.1* 9.6  MG 2.3 2.1  --   --   --   --   --   PHOS 3.6 3.5  --   --   --   --   --    Liver Function Tests: Recent Labs  Lab 03/28/24 1820  AST 27  ALT 13  ALKPHOS 125  BILITOT 1.0  PROT 8.3*  ALBUMIN 3.5   CBG: Recent Labs  Lab 03/30/24 2004 03/30/24 2350 03/31/24 0430 03/31/24 0744 03/31/24 1212  GLUCAP 117* 98 84 88 108*    Discharge time spent: 35 minutes.  Signed: Elgin Lam, MD [facility] 03/31/2024

## 2024-03-31 NOTE — Progress Notes (Signed)
 Joe [name]   DOB:09/21/1950   FM#:[id number]      ASSESSMENT & PLAN:  Joe [name] is a 73 year old very pleasant male patient who was admitted with hypercalcemia of malignancy on 03/28/2024.  Oncology consult has been done and patient will be followed up in the outpatient setting by Dr. Lanny.  Hypercalcemia of malignancy - Patient with elevated calcium  15.0 on 8/1. - Status post Zometa  and calcitonin, IV fluids. - Currently calcium  level has normalized 9.6 today 8/4.  Diffuse bone lesions-unknown primary  -Abdominal and pelvic MRI showed extensive lytic lesions compatible with metastasis, no primary tumor identified on CT and MRI.  Largest lesion right fourth rib measures 2.0 x 2.9 cm. - Differential includes metastatic prostate CA although PSA is not elevated, or multiple myeloma.  MM panel, beta 2, kappa/lambda light chain, 24-hour urine UPEP pending. -CT-guided bone biopsy done today 8/4. - Medical oncology/Dr. Lanny will follow in outpatient oncology within 1 week for further evaluation and treatment recommendations.  Pain, rib cage and back - May be secondary to malignancy - Continue pain meds as ordered - Pending results of biopsy    Code Status DNR-Limited  Subjective:  Patient seen awake and alert laying in bed.  He is very pleasant.  Niece is at the bedside.  Complains of sacral area pain and pain in his ribs on the left side.  Asking when he can go home.  Patient and niece aware that biopsy results are not ready and will not be known for a few more days.  Agreeable to follow-up in outpatient cancer center.  No other distress is noted.  Objective:   Intake/Output Summary (Last 24 hours) at 03/31/2024 1225 Last data filed at 03/31/2024 1000 Gross per 24 hour  Intake 360 ml  Output 0 ml  Net 360 ml     PHYSICAL EXAMINATION: ECOG PERFORMANCE STATUS: 1 - Symptomatic but completely ambulatory    Vitals:   03/31/24 1140 03/31/24 1214  BP: (!) 114/55 (!) 116/58  Pulse:  (!) 56 100  Resp: 14 16  Temp:  97.8 F (36.6 C)  SpO2: 96% 94%   Filed Weights   03/28/24 2121 03/29/24 0400 03/31/24 0500  Weight: 160 lb 4.4 oz (72.7 kg) 160 lb 4.4 oz (72.7 kg) 165 lb 9.1 oz (75.1 kg)    GENERAL: alert, no distress and comfortable +pain rib cage and lower back/sacral area SKIN: skin color, texture, turgor are normal, no rashes or significant lesions EYES: normal, conjunctiva are pink and non-injected, sclera clear OROPHARYNX: no exudate, no erythema and lips, buccal mucosa, and tongue normal  NECK: supple, thyroid  normal size, non-tender, without nodularity LYMPH: no palpable lymphadenopathy in the cervical, axillary or inguinal LUNGS: clear to auscultation and percussion with normal breathing effort HEART: regular rate & rhythm and no murmurs and no lower extremity edema ABDOMEN: abdomen soft, non-tender and normal bowel sounds MUSCULOSKELETAL: no cyanosis of digits and no clubbing  PSYCH: alert & oriented x 3 with fluent speech NEURO: no focal motor/sensory deficits   All questions were answered. The patient knows to call the clinic with any problems, questions or concerns.   The total time spent in the appointment was 40 minutes encounter with patient including review of chart and various tests results, discussions about plan of care and coordination of care plan  Olam JINNY Brunner, NP 03/31/2024 12:25 PM    Labs Reviewed:  Lab Results  Component Value Date   WBC 6.8 03/31/2024   HGB 12.0 (  L) 03/31/2024   HCT 37.0 (L) 03/31/2024   MCV 102.8 (H) 03/31/2024   PLT 221 03/31/2024   Recent Labs    03/28/24 1820 03/28/24 2119 03/29/24 1432 03/30/24 0246 03/31/24 0451  NA 141   < > 141 145 143  K 4.0   < > 3.0* 3.8 3.2*  CL 100   < > 104 109 107  CO2 28   < > 30 28 27   GLUCOSE 98   < > 114* 99 97  BUN 29*   < > 27* 26* 22  CREATININE 1.28*   < > 1.08 1.10 0.97  CALCIUM  >15.0*   < > 11.8* 11.1* 9.6  GFRNONAA 58*   < > >60 >60 >60  PROT 8.3*  --   --    --   --   ALBUMIN 3.5  --   --   --   --   AST 27  --   --   --   --   ALT 13  --   --   --   --   ALKPHOS 125  --   --   --   --   BILITOT 1.0  --   --   --   --    < > = values in this interval not displayed.    Studies Reviewed:  MR CERVICAL SPINE WO CONTRAST Result Date: 03/29/2024 CLINICAL DATA:  Provided history: Bilateral hand paresthesia. History of osseous metastases. Evaluate for compressive cervical spine lesion. EXAM: MRI CERVICAL SPINE WITHOUT CONTRAST TECHNIQUE: Multiplanar, multisequence MR imaging of the cervical spine was performed. No intravenous contrast was administered. COMPARISON:  None. FINDINGS: Alignment: No significant spondylolisthesis. Vertebrae: Multifocal signal abnormality within the cervical spine and within visible portions of the skull base, calvarium, thoracic spine and ribs consistent with widespread osseous metastatic disease. Notably, there are metastases within the C4 and C6 vertebrae which involve the superior endplates at these levels (for instance as seen on series 6, images 8 and 9). Cord: No signal abnormality identified within the cervical spinal cord. Posterior Fossa, vertebral arteries, paraspinal tissues: Flow voids preserved within visible portions of the cervical vertebral arteries. No paraspinal mass or collection. Disc levels: No more than mild disc degeneration within the cervical spine. C2-C3: No significant disc herniation or spinal canal stenosis. Uncovertebral hypertrophy on the right resulting in moderate right neural foraminal narrowing. C3-C4: Small central disc protrusion. Uncovertebral hypertrophy on the right. The disc protrusion minimally effaces the thecal sac (without significant spinal canal stenosis). Mild right neural foraminal narrowing. C4-C5: Slight disc bulge. Mild facet hypertrophy on the left. Slight ligamentum flavum thickening. No significant spinal canal stenosis. Mild left neural foraminal narrowing. C5-C6: Slight disc bulge.  Minimal facet hypertrophy. Mild ligamentum flavum thickening. No significant spinal canal or neural foraminal narrowing. C6-C7: Slight disc bulge. Uncovertebral hypertrophy (greater on the left). No significant spinal canal stenosis. Moderate left neural foraminal narrowing. C7-T1: No significant disc herniation or stenosis. IMPRESSION: 1. 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 Of note, there are metastases within the C4 and C6 vertebrae which involve the superior endplates at these levels. No tumor encroachment upon the cervical spinal canal or neural foramina. 2. Cervical spondylosis as outlined within the body of the report. No significant spinal canal stenosis. Multilevel neural foraminal stenosis, greatest on the right at C2-C3 and on the left at C6-C7 (moderate at these sites). Electronically  Signed   By: Rockey Childs D.O.   On: 03/29/2024 14:31   MR ABDOMEN WWO CONTRAST Result Date: 03/28/2024 CLINICAL DATA:  New diagnosis of multiple bone metastases. Screening for primary neoplastic process. EXAM: MRI ABDOMEN AND PELVIS WITHOUT AND WITH CONTRAST TECHNIQUE: Multiplanar multisequence MR imaging of the abdomen and pelvis was performed both before and after the administration of intravenous contrast. CONTRAST:  7mL GADAVIST  GADOBUTROL  1 MMOL/ML IV SOLN COMPARISON:  None Available. FINDINGS: COMBINED FINDINGS FOR BOTH MR ABDOMEN AND PELVIS Lower chest: Unremarkable MR appearance to the lung bases. No pleural effusion. No pericardial effusion. Normal heart size. Hepatobiliary: The liver is normal in size and configuration. No intrahepatic or extrahepatic bile duct dilatation. No choledocholithiasis. Small volume dependent gallstones noted without imaging signs of acute cholecystitis. Pancreas: No mass, inflammatory changes or other parenchymal abnormality identified. No main pancreatic duct dilation.  Spleen:  Within normal limits in size and appearance. No focal mass. Adrenals/Urinary Tract: Unremarkable adrenal glands. No hydroureteronephrosis. Simple cyst noted in the right kidney lower pole measuring 1.5 x 1.6 cm. There is a smaller simple cyst in the left kidney upper pole, laterally. Stomach/Bowel: Visualized portions within the abdomen are unremarkable. No disproportionate dilation of bowel loops. Multiple colonic diverticula noted without diverticulitis. Vascular/Lymphatic: No pathologically enlarged lymph nodes identified. No abdominal aortic aneurysm demonstrated. No ascites. Reproductive: The prostate is not well evaluated on this exam. However, there is heterogeneous transitional zone, especially near the apex. Correlation with serum PSA is recommended to determine the need for dedicated pelvic MRI as per prostate protocol. Other:  None. Musculoskeletal: There are extensive mixed T2 hypointense and hyperintense lesions throughout the imaged bones compatible with metastases. There are several rib lesions with associated soft tissue component, as seen on the coronal images, better evaluated on the recent chest CT scan. IMPRESSION: 1. Extensive osseous metastases. There are several rib lesions with associated soft tissue component, better evaluated on the recent chest CT scan. 2. No primary neoplastic process identified in the abdomen or pelvis. 3. Heterogeneous prostate transitional zone, especially near the apex. Correlation with serum PSA is recommended to determine the need for dedicated pelvic MRI as per prostate protocol. 4. Cholelithiasis without imaging signs of acute cholecystitis. 5. Multiple other nonacute observations, as described above. Electronically Signed   By: Ree Molt M.D.   On: 03/28/2024 18:53   MR PELVIS W WO CONTRAST Result Date: 03/28/2024 CLINICAL DATA:  New diagnosis of multiple bone metastases. Screening for primary neoplastic process. EXAM: MRI ABDOMEN AND PELVIS  WITHOUT AND WITH CONTRAST TECHNIQUE: Multiplanar multisequence MR imaging of the abdomen and pelvis was performed both before and after the administration of intravenous contrast. CONTRAST:  7mL GADAVIST  GADOBUTROL  1 MMOL/ML IV SOLN COMPARISON:  None Available. FINDINGS: COMBINED FINDINGS FOR BOTH MR ABDOMEN AND PELVIS Lower chest: Unremarkable MR appearance to the lung bases. No pleural effusion. No pericardial effusion. Normal heart size. Hepatobiliary: The liver is normal in size and configuration. No intrahepatic or extrahepatic bile duct dilatation. No choledocholithiasis. Small volume dependent gallstones noted without imaging signs of acute cholecystitis. Pancreas: No mass, inflammatory changes or other parenchymal abnormality identified. No main pancreatic duct dilation. Spleen:  Within normal limits in size and appearance. No focal mass. Adrenals/Urinary Tract: Unremarkable adrenal glands. No hydroureteronephrosis. Simple cyst noted in the right kidney lower pole measuring 1.5 x 1.6 cm. There is a smaller simple cyst in the left kidney upper pole, laterally. Stomach/Bowel: Visualized portions within the abdomen are unremarkable. No disproportionate dilation  of bowel loops. Multiple colonic diverticula noted without diverticulitis. Vascular/Lymphatic: No pathologically enlarged lymph nodes identified. No abdominal aortic aneurysm demonstrated. No ascites. Reproductive: The prostate is not well evaluated on this exam. However, there is heterogeneous transitional zone, especially near the apex. Correlation with serum PSA is recommended to determine the need for dedicated pelvic MRI as per prostate protocol. Other:  None. Musculoskeletal: There are extensive mixed T2 hypointense and hyperintense lesions throughout the imaged bones compatible with metastases. There are several rib lesions with associated soft tissue component, as seen on the coronal images, better evaluated on the recent chest CT scan.  IMPRESSION: 1. Extensive osseous metastases. There are several rib lesions with associated soft tissue component, better evaluated on the recent chest CT scan. 2. No primary neoplastic process identified in the abdomen or pelvis. 3. Heterogeneous prostate transitional zone, especially near the apex. Correlation with serum PSA is recommended to determine the need for dedicated pelvic MRI as per prostate protocol. 4. Cholelithiasis without imaging signs of acute cholecystitis. 5. Multiple other nonacute observations, as described above. Electronically Signed   By: Ree Molt M.D.   On: 03/28/2024 18:53   CT CHEST W CONTRAST Result Date: 03/26/2024 CLINICAL DATA:  Abnormal chest x-ray. Pain in left side of lungs. * Tracking Code: BO * EXAM: CT CHEST WITH CONTRAST TECHNIQUE: Multidetector CT imaging of the chest was performed during intravenous contrast administration. RADIATION DOSE REDUCTION: This exam was performed according to the departmental dose-optimization program which includes automated exposure control, adjustment of the mA and/or kV according to patient size and/or use of iterative reconstruction technique. CONTRAST:  ISOVUE -300 IOPAMIDOL  (ISOVUE -300) INJECTION 61% COMPARISON:  None Available. FINDINGS: Cardiovascular: Normal cardiac size. No pericardial effusion. No aortic aneurysm. There are coronary artery calcifications, in keeping with coronary artery disease. There are also mild peripheral atherosclerotic vascular calcifications of thoracic aorta and its major branches. Mediastinum/Nodes: Visualized thyroid  gland appears grossly unremarkable. No solid / cystic mediastinal masses. The esophagus is nondistended precluding optimal assessment. No axillary, mediastinal or hilar lymphadenopathy by size criteria. Lungs/Pleura: The central tracheo-bronchial tree is patent. No mass or consolidation. No pleural effusion or pneumothorax. No suspicious lung nodules. Upper Abdomen: Small volume  calcified gallstones noted without imaging signs of acute cholecystitis. Remaining visualized upper abdominal viscera within normal limits. Musculoskeletal: The visualized soft tissues of the chest wall are grossly unremarkable. There are extensive lytic lesions throughout the imaged bones, compatible with metastases. There is also associated destruction as well as expansile lytic lesions with soft tissue in several ribs including largest lesion along the anterolateral right fourth rib with soft tissue measuring up to 2.0 x 2.9 cm. There are mild multilevel degenerative changes in the visualized spine. IMPRESSION: 1. There are extensive lytic lesions throughout the imaged bones, compatible with metastases. There is also associated destruction as well as expansile lytic lesions with soft tissue in several ribs including largest lesion along the anterolateral right fourth rib with soft tissue measuring up to 2.0 x 2.9 cm. 2. No lung mass, consolidation, pleural effusion or pneumothorax. 3. Multiple other nonacute observations, as described above. Aortic Atherosclerosis (ICD10-I70.0). Electronically Signed   By: Ree Molt M.D.   On: 03/26/2024 16:32   DG Chest 2 View Result Date: 03/25/2024 CLINICAL DATA:  Chest pain for 3 weeks. EXAM: CHEST - 2 VIEW COMPARISON:  None Available. FINDINGS: The heart size and mediastinal contours are within normal limits. There appears to be a pleural based mass in the right midlung. There is  also the suggestion of possible lytic lesion involving the right eighth rib laterally as well as possible other lytic lesions involving the right ribs and possibly scapula. No definite consolidative process is noted. IMPRESSION: Probable pleural based mass seen in right midlung with possible lytic lesions involving multiple right ribs and right scapula. CT scan of the chest is recommended for further evaluation. These results will be called to the ordering clinician or representative by the  Radiologist Assistant, and communication documented in the PACS or zVision Dashboard. Electronically Signed   By: Lynwood Landy Raddle M.D.   On: 03/25/2024 17:03

## 2024-03-31 NOTE — Plan of Care (Signed)
  Problem: Clinical Measurements: Goal: Will remain free from infection Outcome: Progressing   Problem: Clinical Measurements: Goal: Diagnostic test results will improve Outcome: Progressing   Problem: Activity: Goal: Risk for activity intolerance will decrease Outcome: Progressing   

## 2024-03-31 NOTE — Progress Notes (Signed)
 Patient currently hospitalized. Diagnostic Clinic appointment cancelled for today. Patient being followed in house by oncology.

## 2024-04-01 LAB — MULTIPLE MYELOMA PANEL, SERUM
Albumin SerPl Elph-Mcnc: 3 g/dL (ref 2.9–4.4)
Albumin/Glob SerPl: 0.9 (ref 0.7–1.7)
Alpha 1: 0.3 g/dL (ref 0.0–0.4)
Alpha2 Glob SerPl Elph-Mcnc: 0.8 g/dL (ref 0.4–1.0)
B-Globulin SerPl Elph-Mcnc: 0.8 g/dL (ref 0.7–1.3)
Gamma Glob SerPl Elph-Mcnc: 1.5 g/dL (ref 0.4–1.8)
Globulin, Total: 3.4 g/dL (ref 2.2–3.9)
IgA: 120 mg/dL (ref 61–437)
IgG (Immunoglobin G), Serum: 1753 mg/dL — ABNORMAL HIGH (ref 603–1613)
IgM (Immunoglobulin M), Srm: 32 mg/dL (ref 15–143)
M Protein SerPl Elph-Mcnc: 1.1 g/dL — ABNORMAL HIGH
Total Protein ELP: 6.4 g/dL (ref 6.0–8.5)

## 2024-04-01 LAB — CALCITRIOL (1,25 DI-OH VIT D): Vit D, 1,25-Dihydroxy: 8.3 pg/mL — ABNORMAL LOW (ref 24.8–81.5)

## 2024-04-02 DIAGNOSIS — Z7984 Long term (current) use of oral hypoglycemic drugs: Secondary | ICD-10-CM | POA: Diagnosis not present

## 2024-04-02 DIAGNOSIS — I10 Essential (primary) hypertension: Secondary | ICD-10-CM | POA: Diagnosis not present

## 2024-04-02 DIAGNOSIS — Z79899 Other long term (current) drug therapy: Secondary | ICD-10-CM | POA: Diagnosis not present

## 2024-04-02 DIAGNOSIS — E1140 Type 2 diabetes mellitus with diabetic neuropathy, unspecified: Secondary | ICD-10-CM | POA: Diagnosis not present

## 2024-04-02 DIAGNOSIS — K7581 Nonalcoholic steatohepatitis (NASH): Secondary | ICD-10-CM | POA: Diagnosis not present

## 2024-04-02 DIAGNOSIS — M21372 Foot drop, left foot: Secondary | ICD-10-CM | POA: Diagnosis not present

## 2024-04-02 DIAGNOSIS — E870 Hyperosmolality and hypernatremia: Secondary | ICD-10-CM | POA: Diagnosis not present

## 2024-04-02 DIAGNOSIS — Z9181 History of falling: Secondary | ICD-10-CM | POA: Diagnosis not present

## 2024-04-02 DIAGNOSIS — E785 Hyperlipidemia, unspecified: Secondary | ICD-10-CM | POA: Diagnosis not present

## 2024-04-02 DIAGNOSIS — K219 Gastro-esophageal reflux disease without esophagitis: Secondary | ICD-10-CM | POA: Diagnosis not present

## 2024-04-02 LAB — PROTEIN ELECTROPHORESIS, SERUM
A/G Ratio: 0.9 (ref 0.7–1.7)
Albumin ELP: 3.2 g/dL (ref 2.9–4.4)
Alpha-1-Globulin: 0.3 g/dL (ref 0.0–0.4)
Alpha-2-Globulin: 0.9 g/dL (ref 0.4–1.0)
Beta Globulin: 0.8 g/dL (ref 0.7–1.3)
Gamma Globulin: 1.6 g/dL (ref 0.4–1.8)
Globulin, Total: 3.6 g/dL (ref 2.2–3.9)
M-Spike, %: 1.2 g/dL — ABNORMAL HIGH
Total Protein ELP: 6.8 g/dL (ref 6.0–8.5)

## 2024-04-02 LAB — GLUCOSE, CAPILLARY
Glucose-Capillary: 108 mg/dL — ABNORMAL HIGH (ref 70–99)
Glucose-Capillary: 133 mg/dL — ABNORMAL HIGH (ref 70–99)
Glucose-Capillary: 83 mg/dL (ref 70–99)
Glucose-Capillary: 94 mg/dL (ref 70–99)
Glucose-Capillary: 95 mg/dL (ref 70–99)

## 2024-04-02 LAB — SURGICAL PATHOLOGY

## 2024-04-03 ENCOUNTER — Telehealth: Payer: Self-pay | Admitting: Hematology

## 2024-04-03 LAB — UPEP/UIFE/LIGHT CHAINS/TP, 24-HR UR
% BETA, Urine: 13.7 %
ALPHA 1 URINE: 6.9 %
Albumin, U: 40.3 %
Alpha 2, Urine: 14.7 %
Free Kappa Lt Chains,Ur: 65.85 mg/L (ref 1.17–86.46)
Free Kappa/Lambda Ratio: 33.77 — ABNORMAL HIGH (ref 1.83–14.26)
Free Lambda Lt Chains,Ur: 1.95 mg/L (ref 0.27–15.21)
GAMMA GLOBULIN URINE: 24.4 %
M-SPIKE %, Urine: 8.6 % — ABNORMAL HIGH
M-Spike, Mg/24 Hr: 25 mg/(24.h) — ABNORMAL HIGH
Total Protein, Urine-Ur/day: 290 mg/(24.h) — ABNORMAL HIGH (ref 30–150)
Total Protein, Urine: 14.5 mg/dL
Total Volume: 2000

## 2024-04-03 NOTE — Telephone Encounter (Signed)
 Scheduled appointment per 8/7 secure chat. Talked with the patients niece Mrs.Alvia and she is aware of the made appointment for the patient.

## 2024-04-04 DIAGNOSIS — C9030 Solitary plasmacytoma not having achieved remission: Secondary | ICD-10-CM | POA: Diagnosis not present

## 2024-04-04 LAB — KAPPA/LAMBDA LIGHT CHAINS
Kappa free light chain: 35.3 mg/L — ABNORMAL HIGH (ref 3.3–19.4)
Kappa, lambda light chain ratio: 4.2 — ABNORMAL HIGH (ref 0.26–1.65)
Lambda free light chains: 8.4 mg/L (ref 5.7–26.3)

## 2024-04-04 LAB — BETA 2 MICROGLOBULIN, SERUM: Beta-2 Microglobulin: 3 mg/L — ABNORMAL HIGH (ref 0.6–2.4)

## 2024-04-07 LAB — SURGICAL PATHOLOGY

## 2024-04-08 DIAGNOSIS — C9000 Multiple myeloma not having achieved remission: Secondary | ICD-10-CM

## 2024-04-08 HISTORY — DX: Multiple myeloma not having achieved remission: C90.00

## 2024-04-08 LAB — PTH-RELATED PEPTIDE: PTH-related peptide: <2 pmol/L

## 2024-04-08 NOTE — Progress Notes (Signed)
 START ON PATHWAY REGIMEN - Multiple Myeloma and Other Plasma Cell Dyscrasias     Cycles 1 and 2: A cycle is every 28 days:     Lenalidomide       Dexamethasone      Daratumumab and hyaluronidase-fihj    Cycles 3 through 6: A cycle is every 28 days:     Lenalidomide       Dexamethasone      Daratumumab and hyaluronidase-fihj    Cycles 7 and beyond: A cycle is every 28 days:     Lenalidomide       Dexamethasone      Daratumumab and hyaluronidase-fihj   **Always confirm dose/schedule in your pharmacy ordering system**  Patient Characteristics: Multiple Myeloma, Newly Diagnosed, Transplant Ineligible or Deferred, Unknown Risk or Awaiting Test Results Disease Classification: Multiple Myeloma Therapeutic Status: Newly Diagnosed R2-ISS Staging: Awaiting Test Results Is Patient Eligible for Transplant<= Transplant Ineligible or Deferred Risk Status: Awaiting Test Results Intent of Therapy: Non-Curative / Palliative Intent, Discussed with Patient

## 2024-04-08 NOTE — Assessment & Plan Note (Addendum)
-   Secondary to multiple myeloma -Patient received IV fluids, Zometa  and calcitonin, resolved after medical treatment.

## 2024-04-09 ENCOUNTER — Inpatient Hospital Stay: Payer: PRIVATE HEALTH INSURANCE | Attending: Hematology | Admitting: Hematology

## 2024-04-09 ENCOUNTER — Inpatient Hospital Stay

## 2024-04-09 VITALS — BP 136/64 | HR 63 | Temp 98.5°F | Resp 17 | Ht 67.0 in | Wt 164.6 lb

## 2024-04-09 DIAGNOSIS — N189 Chronic kidney disease, unspecified: Secondary | ICD-10-CM | POA: Diagnosis not present

## 2024-04-09 DIAGNOSIS — C9000 Multiple myeloma not having achieved remission: Secondary | ICD-10-CM

## 2024-04-09 DIAGNOSIS — Z510 Encounter for antineoplastic radiation therapy: Secondary | ICD-10-CM | POA: Insufficient documentation

## 2024-04-09 DIAGNOSIS — R634 Abnormal weight loss: Secondary | ICD-10-CM | POA: Diagnosis not present

## 2024-04-09 DIAGNOSIS — L299 Pruritus, unspecified: Secondary | ICD-10-CM | POA: Insufficient documentation

## 2024-04-09 DIAGNOSIS — Z5112 Encounter for antineoplastic immunotherapy: Secondary | ICD-10-CM | POA: Insufficient documentation

## 2024-04-09 DIAGNOSIS — E1122 Type 2 diabetes mellitus with diabetic chronic kidney disease: Secondary | ICD-10-CM | POA: Insufficient documentation

## 2024-04-09 DIAGNOSIS — Z794 Long term (current) use of insulin: Secondary | ICD-10-CM | POA: Diagnosis not present

## 2024-04-09 DIAGNOSIS — Z79620 Long term (current) use of immunosuppressive biologic: Secondary | ICD-10-CM | POA: Diagnosis not present

## 2024-04-09 LAB — COMPREHENSIVE METABOLIC PANEL WITH GFR
ALT: 15 U/L (ref 0–44)
AST: 21 U/L (ref 15–41)
Albumin: 3.8 g/dL (ref 3.5–5.0)
Alkaline Phosphatase: 193 U/L — ABNORMAL HIGH (ref 38–126)
Anion gap: 5 (ref 5–15)
BUN: 13 mg/dL (ref 8–23)
CO2: 31 mmol/L (ref 22–32)
Calcium: 9.3 mg/dL (ref 8.9–10.3)
Chloride: 105 mmol/L (ref 98–111)
Creatinine, Ser: 0.94 mg/dL (ref 0.61–1.24)
GFR, Estimated: >60 mL/min (ref >60)
Glucose, Bld: 100 mg/dL — ABNORMAL HIGH (ref 70–99)
Potassium: 4.6 mmol/L (ref 3.5–5.1)
Sodium: 141 mmol/L (ref 135–145)
Total Bilirubin: 0.5 mg/dL (ref 0.0–1.2)
Total Protein: 7.9 g/dL (ref 6.5–8.1)

## 2024-04-09 LAB — CBC WITH DIFFERENTIAL/PLATELET
Abs Immature Granulocytes: 0.03 K/uL (ref 0.00–0.07)
Basophils Absolute: 0.1 K/uL (ref 0.0–0.1)
Basophils Relative: 1 %
Eosinophils Absolute: 0.3 K/uL (ref 0.0–0.5)
Eosinophils Relative: 4 %
HCT: 39.2 % (ref 39.0–52.0)
Hemoglobin: 13.3 g/dL (ref 13.0–17.0)
Immature Granulocytes: 0 %
Lymphocytes Relative: 36 %
Lymphs Abs: 3.2 K/uL (ref 0.7–4.0)
MCH: 33.7 pg (ref 26.0–34.0)
MCHC: 33.9 g/dL (ref 30.0–36.0)
MCV: 99.2 fL (ref 80.0–100.0)
Monocytes Absolute: 0.6 K/uL (ref 0.1–1.0)
Monocytes Relative: 7 %
Neutro Abs: 4.7 K/uL (ref 1.7–7.7)
Neutrophils Relative %: 52 %
Platelets: 422 K/uL — ABNORMAL HIGH (ref 150–400)
RBC: 3.95 MIL/uL — ABNORMAL LOW (ref 4.22–5.81)
RDW: 13.2 % (ref 11.5–15.5)
WBC: 8.9 K/uL (ref 4.0–10.5)
nRBC: 0 % (ref 0.0–0.2)

## 2024-04-09 MED ORDER — TRAMADOL HCL 50 MG PO TABS: 50.0000 mg | ORAL_TABLET | Freq: Four times a day (QID) | ORAL | 0 refills | Status: DC | PRN

## 2024-04-09 NOTE — Addendum Note (Signed)
 Addended by: LANNY CALLANDER on: 04/09/2024 06:11 PM   Modules accepted: Orders

## 2024-04-09 NOTE — Assessment & Plan Note (Signed)
-  IgG kappa type - Patient with admitted to the hospital on March 29, 2023 for hypercalcemia and diffuse lytic bone lesions on CT and MRI. -Bone marrow biopsy showed plasmacytoma.  Due to the sample limitation, the accurate percentage of plasma cell cannot be determined -Multiple myeloma showed IgG kappa monoclonal protein 1.1g/dl, elevated serum IgG and kappa light chain level with ratio 4.2.  Beta-2 microglobulin 3.0, LDH normal. -Based on the above lab results, diffuse bone lesions, hypercalcemia, mild renal insufficiency and the bone marrow biopsy, patient meets the criteria for multiple myeloma -I recommended induction chemotherapy

## 2024-04-09 NOTE — Progress Notes (Addendum)
 [facility]   Telephone:(336) [phone] Fax:(336) [phone]   Clinic Follow up Note   Patient Care Team: [name], MD as PCP - General (Family Medicine)  Date of Service:  04/09/2024  CHIEF COMPLAINT: Follow-up after hospital discharge  CURRENT THERAPY:  Pending  Oncology History   Hypercalcemia of malignancy - Secondary to multiple myeloma -Patient received IV fluids, Zometa  and calcitonin, resolved after medical treatment.  Multiple myeloma (HCC) -IgG kappa type - Patient with admitted to the hospital on March 29, 2023 for hypercalcemia and diffuse lytic bone lesions on CT and MRI. -Bone marrow biopsy showed plasmacytoma.  Due to the sample limitation, the accurate percentage of plasma cell cannot be determined -Multiple myeloma showed IgG kappa monoclonal protein 1.1g/dl, elevated serum IgG and kappa light chain level with ratio 4.2.  Beta-2 microglobulin 3.0, LDH normal. -Based on the above lab results, diffuse bone lesions, hypercalcemia, mild renal insufficiency and the bone marrow biopsy, patient meets the criteria for multiple myeloma -I recommended induction chemotherapy  Assessment & Plan Multiple myeloma with plasmacytomas -Newly diagnosed multiple myeloma with bone involvement and plasmacytomas. Biopsy of left iliac bone confirmed plasma cell infiltration. Bone marrow biopsy showed some plasma cells but mostly normal marrow. CT scan revealed multiple lytic bone lesions. Complications include hypercalcemia and chronic kidney disease. He experiences significant bone pain, particularly in the tailbone and rib areas, rated 7-8/10, affecting mobility and comfort. - Initiate chemotherapy with a three-drug regimen: injection, oral Revlimid , and steroids. - Order urgent referral to radiation oncology for tailbone pain management. - Order baseline PET scan. - Monitor blood tests for M protein and light chain levels. - Advise on hydration to manage  hypercalcemia. - Prescribe Aciclovir for shingles prophylaxis. - Prescribe Zofran  for nausea management. - Prescribe Tramadol  for pain management. - Schedule chemotherapy class for education.  Type 2 diabetes mellitus, improved but requiring monitoring during chemotherapy and steroid therapy Type 2 diabetes mellitus improved with weight loss. Currently managed with Jardiance. Steroid use during chemotherapy may cause hyperglycemia. He monitors blood glucose daily. - Continue daily blood glucose monitoring. - Adjust diabetic medications as needed based on blood glucose readings.  Pruritus at biopsy site Pruritus at biopsy site without visible rash. He reports significant itching. Previous treatments included Neosporin and Benadryl. - Recommend hydrocortisone cream and Benadryl cream for topical use. - Advise taking oral Benadryl at night for itching. - Suggest using Zyrtec or Claritin during the day if itching persists.   Plan - Her multiple myeloma lab, bone biopsy and bone marrow biopsy results were reviewed with patient in detail - We discussed the prognosis of multiple myeloma, and overall treatment planning. - Urgent referral to radiation oncology for sacral pain from myeloma - Plan to start DRd in a few weeks   SUMMARY OF ONCOLOGIC HISTORY: Oncology History  Multiple myeloma (HCC)  04/08/2024 Initial Diagnosis   Multiple myeloma (HCC)   04/23/2024 -  Chemotherapy   Patient is on Treatment Plan : MYELOMA NEWLY DIAGNOSED Daratumumab IV + Lenalidomide  + Dexamethasone Weekly (DaraRd) q28d        Discussed the use of AI scribe software for clinical note transcription with the patient, who gave verbal consent to proceed.  History of Present Illness Joe [name] is a 73 year old male with newly diagnosed multiple myeloma who presents for follow-up after hospital discharge. He is accompanied by his niece, [name], who is a Engineer, site.  He was recently hospitalized for  hypercalcemia and renal impairment, leading  to the diagnosis of multiple myeloma. A biopsy of the left iliac bone and a random bone marrow biopsy were performed during hospitalization.  He experiences significant pain in the tailbone area, rated 7 to 8 out of 10, especially when sitting. Pain is also present in the ribs and hips, affecting his ability to stand and sit comfortably. Rib pain is noticeable when coughing or getting choked.  Severe itching at the biopsy site persists despite using various itch medications and soaps, including Benadryl and Neosporin. No rash is present.  Current medications include oxycodone  5 mg at night for pain and Jardiance for diabetes. He has lost 85 pounds recently, potentially due to medication and multiple myeloma.  He lives with his daughter and is able to take care of himself. He is not driving but is accompanied by family members to appointments.     All other systems were reviewed with the patient and are negative.  MEDICAL HISTORY:  Past Medical History:  Diagnosis Date   Anxiety    Diabetes mellitus without complication (HCC)     SURGICAL HISTORY: No past surgical history on file.  I have reviewed the social history and family history with the patient and they are unchanged from previous note.  ALLERGIES:  has no known allergies.  MEDICATIONS:  Current Outpatient Medications  Medication Sig Dispense Refill   traMADol  (ULTRAM ) 50 MG tablet Take 1 tablet (50 mg total) by mouth every 6 (six) hours as needed. 30 tablet 0   carvedilol  (COREG ) 6.25 MG tablet Take 6.25 mg by mouth 2 (two) times daily with a meal.     diazepam  (VALIUM ) 2 MG tablet Take 2 mg by mouth in the morning and at bedtime.     esomeprazole (NEXIUM) 40 MG capsule Take 40 mg by mouth daily before breakfast.     famotidine  (PEPCID ) 20 MG tablet Take 20 mg by mouth at bedtime.     FLONASE ALLERGY RELIEF 50 MCG/ACT nasal spray Place 1-2 sprays into both nostrils in the morning  and at bedtime.     JARDIANCE 10 MG TABS tablet Take 10 mg by mouth daily.     [Paused] losartan (COZAAR) 100 MG tablet Take 50 mg by mouth daily.     Multiple Vitamin (MULTIVITAMIN) tablet Take 1 tablet by mouth daily with breakfast.     ONETOUCH VERIO test strip      oxyCODONE  (OXY IR/ROXICODONE ) 5 MG immediate release tablet Take 1 tablet (5 mg total) by mouth every 6 (six) hours as needed for severe pain (pain score 7-10). 12 tablet 0   [Paused] OZEMPIC, 2 MG/DOSE, 8 MG/3ML SOPN Inject 2 mg into the skin every Saturday.     rosuvastatin  (CRESTOR ) 10 MG tablet Take 10 mg by mouth daily.     sildenafil (VIAGRA) 100 MG tablet Take 100 mg by mouth daily as needed (for E.D.).     No current facility-administered medications for this visit.    PHYSICAL EXAMINATION: ECOG PERFORMANCE STATUS: 2 - Symptomatic, <50% confined to bed  Vitals:   04/09/24 1510  BP: 136/64  Pulse: 63  Resp: 17  Temp: 98.5 F (36.9 C)  SpO2: 95%   Wt Readings from Last 3 Encounters:  04/09/24 164 lb 9.6 oz (74.7 kg)  03/31/24 165 lb 9.1 oz (75.1 kg)     GENERAL:alert, no distress and comfortable SKIN: skin color, texture, turgor are normal, no rashes or significant lesions EYES: normal, Conjunctiva are pink and non-injected, sclera clear NECK: supple, thyroid   normal size, non-tender, without nodularity LYMPH:  no palpable lymphadenopathy in the cervical, axillary  LUNGS: clear to auscultation and percussion with normal breathing effort HEART: regular rate & rhythm and no murmurs and no lower extremity edema ABDOMEN:abdomen soft, non-tender and normal bowel sounds Musculoskeletal:no cyanosis of digits and no clubbing  NEURO: alert & oriented x 3 with fluent speech, no focal motor/sensory deficits  Physical Exam SKIN: No rash.  LABORATORY DATA:  I have reviewed the data as listed    Latest Ref Rng & Units 04/09/2024    4:06 PM 03/31/2024    4:51 AM 03/29/2024    2:42 AM  CBC  WBC 4.0 - 10.5 K/uL 8.9   6.8  7.2   Hemoglobin 13.0 - 17.0 g/dL 86.6  87.9  86.6   Hematocrit 39.0 - 52.0 % 39.2  37.0  42.1   Platelets 150 - 400 K/uL 422  221  222         Latest Ref Rng & Units 04/09/2024    4:06 PM 03/31/2024    4:51 AM 03/30/2024    2:46 AM  CMP  Glucose 70 - 99 mg/dL 899  97  99   BUN 8 - 23 mg/dL 13  22  26    Creatinine 0.61 - 1.24 mg/dL 9.05  9.02  8.89   Sodium 135 - 145 mmol/L 141  143  145   Potassium 3.5 - 5.1 mmol/L 4.6  3.2  3.8   Chloride 98 - 111 mmol/L 105  107  109   CO2 22 - 32 mmol/L 31  27  28    Calcium  8.9 - 10.3 mg/dL 9.3  9.6  88.8   Total Protein 6.5 - 8.1 g/dL 7.9     Total Bilirubin 0.0 - 1.2 mg/dL 0.5     Alkaline Phos 38 - 126 U/L 193     AST 15 - 41 U/L 21     ALT 0 - 44 U/L 15         RADIOGRAPHIC STUDIES: I have personally reviewed the radiological images as listed and agreed with the findings in the report. No results found.    Orders Placed This Encounter  Procedures   NM PET Image Initial (PI) Skull Base To Thigh    Standing Status:   Future    Expected Date:   04/23/2024    Expiration Date:   04/09/2025    If indicated for the ordered procedure, I authorize the administration of a radiopharmaceutical per Radiology protocol:   Yes    Preferred imaging location?:   [facility]   Comprehensive metabolic panel with GFR    Standing Status:   Standing    Number of Occurrences:   50    Expiration Date:   04/09/2025   CBC with Differential/Platelet    Standing Status:   Standing    Number of Occurrences:   50    Expiration Date:   04/09/2025   CBC with Differential (Cancer Center Only)    Standing Status:   Future    Expected Date:   04/23/2024    Expiration Date:   04/23/2025   CBC with Differential (Cancer Center Only)    Standing Status:   Future    Expected Date:   04/30/2024    Expiration Date:   04/30/2025   CBC with Differential (Cancer Center Only)    Standing Status:   Future    Expected Date:   05/07/2024    Expiration Date:  05/07/2025    CBC with Differential (Cancer Center Only)    Standing Status:   Future    Expected Date:   05/14/2024    Expiration Date:   05/14/2025   Kappa/lambda light chains    Standing Status:   Future    Expected Date:   05/21/2024    Expiration Date:   05/21/2025   Multiple Myeloma Panel (SPEP&IFE w/QIG)    Standing Status:   Future    Expected Date:   05/21/2024    Expiration Date:   05/21/2025   CBC with Differential (Cancer Center Only)    Standing Status:   Future    Expected Date:   05/21/2024    Expiration Date:   05/21/2025   CBC with Differential (Cancer Center Only)    Standing Status:   Future    Expected Date:   05/28/2024    Expiration Date:   05/28/2025   CBC with Differential (Cancer Center Only)    Standing Status:   Future    Expected Date:   06/04/2024    Expiration Date:   06/04/2025   CBC with Differential (Cancer Center Only)    Standing Status:   Future    Expected Date:   06/11/2024    Expiration Date:   06/11/2025   Ambulatory referral to Radiation Oncology    Referral Priority:   Urgent    Referral Type:   Consultation    Referral Reason:   Specialty Services Required    Requested Specialty:   Radiation Oncology    Number of Visits Requested:   1   All questions were answered. The patient knows to call the clinic with any problems, questions or concerns. No barriers to learning was detected. The total time spent in the appointment was 60 minutes, including review of chart and various tests results, discussions about plan of care and coordination of care plan     Onita Mattock, MD 04/09/2024

## 2024-04-10 ENCOUNTER — Telehealth: Payer: Self-pay | Admitting: Pharmacist

## 2024-04-10 ENCOUNTER — Telehealth: Payer: Self-pay

## 2024-04-10 ENCOUNTER — Other Ambulatory Visit: Payer: Self-pay

## 2024-04-10 ENCOUNTER — Other Ambulatory Visit (HOSPITAL_COMMUNITY): Payer: Self-pay

## 2024-04-10 ENCOUNTER — Encounter: Payer: Self-pay | Admitting: Hematology

## 2024-04-10 DIAGNOSIS — C9000 Multiple myeloma not having achieved remission: Secondary | ICD-10-CM

## 2024-04-10 MED ORDER — LENALIDOMIDE 25 MG PO CAPS: 25.0000 mg | ORAL_CAPSULE | Freq: Every day | ORAL | 0 refills | Status: DC

## 2024-04-10 NOTE — Telephone Encounter (Signed)
 Oral Oncology Pharmacist Encounter  Received new prescription for Revlimid  (lenalidomide ) for the treatment of IgG kappa multiple myeloma in conjunction with daratumumab and dexamethasone, planned duration until disease progression or unacceptable drug toxicity.  Plan is for patient to be on Revlimid  25 mg, 1 capsule by mouth daily for 21 days on, 7 days off. Repeat every 28 days.   CBC w/ Diff and CMP from 04/09/24 assessed, no relevant lab abnormalities requiring baseline dose adjustment required at this time. Prescription dose and frequency assessed for appropriateness.  Patient will need acyclovir  released from treatment plan for VZV PPX. Patient will need to be started on ASA 81 mg for VTE PPX (will discuss with patient during medication counseling).   Current medication list in Epic reviewed, no relevant/significant DDIs with Revlimid  identified.  Evaluated chart and no patient barriers to medication adherence noted.   Patient agreement for treatment documented in MD note on 04/09/24.  Pending prior authorization approval, prescription will need to be e-scribed to [facility] for dispensing given medication being limited distribution.   Oral Oncology Clinic will continue to follow for insurance authorization, copayment issues, initial counseling and start date.  Asberry Macintosh, PharmD, BCPS, BCOP Hematology/Oncology Clinical Pharmacist [phone] 04/10/2024 8:28 AM

## 2024-04-10 NOTE — Telephone Encounter (Signed)
 Oral Oncology Patient Advocate Encounter  Prior Authorization for lenalidomide  has been approved.    PA# [id number]  Effective dates: 03/11/24 through 04/10/25  Patients co-pay is $0.00.     Charlott Hamilton,  CPhT-Adv  she/her/hers [facility]  [facility] Pharmacy Technician Patient Advocate Specialist III [facility] Phone: [phone]  Fax: [phone] [email or web address]

## 2024-04-10 NOTE — Telephone Encounter (Signed)
 Oral Oncology Patient Advocate Encounter   Received notification that prior authorization for Lenalidomide  is required.   PA submitted on 04/10/2024 Key [id number] Status is pending      Charlott Hamilton,  CPhT-Adv  she/her/hers [facility]  [facility] Pharmacy Technician Patient Advocate Specialist III [facility] Phone: [phone]  Fax: [phone] [email or web address]

## 2024-04-10 NOTE — Progress Notes (Signed)
 Histology and Location of Primary Cancer: Multiple myeloma with bone involvement and plasmacytomas  Location(s) of Symptomatic Metastases: Left Iliac  Left Iliac Bone Biopsy 03/31/2024    Past/Anticipated chemotherapy by medical oncology, if any:  Dr. Lanny 04/09/2024 - Order urgent referral to radiation oncology for tailbone pain management.     Pain on a scale of 0-10 is:  Reports pain to tailbone at times.   Ambulatory status? Walker? Wheelchair?:   SAFETY ISSUES: Prior radiation? No Pacemaker/ICD? No Possible current pregnancy? N/a Is the patient on methotrexate? No  Current Complaints / other details:    BP 131/65 (BP Location: Left Arm, Patient Position: Sitting, Cuff Size: Large)   Pulse 62   Temp 97.8 F (36.6 C)   Resp 20   Ht 5' 7 (1.702 m)   Wt 161 lb 12.8 oz (73.4 kg)   SpO2 99%   BMI 25.34 kg/m

## 2024-04-11 ENCOUNTER — Ambulatory Visit
Admission: RE | Admit: 2024-04-11 | Discharge: 2024-04-11 | Disposition: A | Discharge status: Home / Self Care | Source: Ambulatory Visit | Attending: Radiation Oncology | Admitting: Radiation Oncology

## 2024-04-11 ENCOUNTER — Encounter: Payer: Self-pay | Admitting: Radiation Oncology

## 2024-04-11 ENCOUNTER — Other Ambulatory Visit: Payer: Self-pay

## 2024-04-11 VITALS — BP 131/65 | HR 62 | Temp 97.8°F | Resp 20 | Ht 67.0 in | Wt 161.8 lb

## 2024-04-11 DIAGNOSIS — Z79899 Other long term (current) drug therapy: Secondary | ICD-10-CM | POA: Diagnosis not present

## 2024-04-11 DIAGNOSIS — N281 Cyst of kidney, acquired: Secondary | ICD-10-CM | POA: Diagnosis not present

## 2024-04-11 DIAGNOSIS — C9000 Multiple myeloma not having achieved remission: Secondary | ICD-10-CM | POA: Insufficient documentation

## 2024-04-11 DIAGNOSIS — E119 Type 2 diabetes mellitus without complications: Secondary | ICD-10-CM | POA: Diagnosis not present

## 2024-04-11 DIAGNOSIS — Z510 Encounter for antineoplastic radiation therapy: Secondary | ICD-10-CM | POA: Insufficient documentation

## 2024-04-11 DIAGNOSIS — K8020 Calculus of gallbladder without cholecystitis without obstruction: Secondary | ICD-10-CM | POA: Insufficient documentation

## 2024-04-11 DIAGNOSIS — M47812 Spondylosis without myelopathy or radiculopathy, cervical region: Secondary | ICD-10-CM | POA: Insufficient documentation

## 2024-04-11 DIAGNOSIS — Z5112 Encounter for antineoplastic immunotherapy: Secondary | ICD-10-CM | POA: Diagnosis not present

## 2024-04-11 DIAGNOSIS — I700 Atherosclerosis of aorta: Secondary | ICD-10-CM | POA: Insufficient documentation

## 2024-04-11 DIAGNOSIS — I2510 Atherosclerotic heart disease of native coronary artery without angina pectoris: Secondary | ICD-10-CM | POA: Diagnosis not present

## 2024-04-11 DIAGNOSIS — N189 Chronic kidney disease, unspecified: Secondary | ICD-10-CM | POA: Diagnosis not present

## 2024-04-11 DIAGNOSIS — E1122 Type 2 diabetes mellitus with diabetic chronic kidney disease: Secondary | ICD-10-CM | POA: Diagnosis not present

## 2024-04-11 NOTE — Progress Notes (Signed)
 Radiation Oncology         [phone] ________________________________  Name: Joe [name]        MRN: [id number]  Date of Service: 04/11/2024 DOB: 1951-01-09  RR:Yjffzm, Cheryle, MD  Lanny Callander, MD     REFERRING PHYSICIAN: Lanny Callander, MD   DIAGNOSIS: The encounter diagnosis was Multiple myeloma not having achieved remission (HCC).  Multiple Myeloma, IgG kappa type  HISTORY OF PRESENT ILLNESS: Joe [name] is a 73 y.o. male seen at the request of Dr. Lanny for a newly diagnosed multiple myeloma.    Patient originally presented with a 3-week history of chest pain.  Chest x-ray on 03/24/2024 demonstrated a probable pleural-based mass with possible lytic lesions.  Subsequent CT of the chest on 03/26/2024 showed several lytic lesions throughout the imaged bones, compatible with metastasis; along with associated destruction as well as expansile lytic lesions with soft tissue and several ribs, including the largest lesion along the anterior lateral right fourth rib with soft tissue measuring up to 2.0 x 2.9 cm.  MRI of the abdomen and pelvis on 03/28/2024 showed xxtensive osseous metastases with no primary neoplastic process identified.  Of note, heterogeneity in the prostate transitional zone was also noted.  CMP obtained on 03/28/2024 demonstrated an elevated calcium  at 15.0.  He subsequently presented to the ED on 03/28/2024 secondary to abnormal labs and imaging with evidence of hypercalcemia and lytic bone lesions concerning for metastatic disease versus multiple myeloma. MRI of the cervical spine on 03/29/2024 also noted widespread osseous metastatic disease within the cervical spine and within the visualized portions of the skull base, calvarium, thoracic spine and ribs.  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  Biopsy of the bone marrow performed on the same day demonstrated cellular bone marrow with aberrant plasma cells.  The  multiple myeloma panel performed on 03/29/2024 showed increased IgG at 1,753 mg/dL.   Patient met with Dr. Lanny on 04/09/2024.  Per her recommendations, the patient will proceed with DRd  scheduled to begin on 04/23/2024. PET is scheduled for 04/18/2024.   He was kindly referred to us  today to discuss radiation treatment options.  PREVIOUS RADIATION THERAPY: No   PAST MEDICAL HISTORY:  Past Medical History:  Diagnosis Date   Anxiety    Diabetes mellitus without complication (HCC)        PAST SURGICAL HISTORY:History reviewed. No pertinent surgical history.   FAMILY HISTORY: No family history on file.   SOCIAL HISTORY:  reports that he has never smoked. He does not have any smokeless tobacco history on file. He reports that he does not drink alcohol and does not use drugs.   ALLERGIES: Patient has no known allergies.   MEDICATIONS:  Current Outpatient Medications  Medication Sig Dispense Refill   carvedilol  (COREG ) 6.25 MG tablet Take 6.25 mg by mouth 2 (two) times daily with a meal.     diazepam  (VALIUM ) 2 MG tablet Take 2 mg by mouth in the morning and at bedtime.     esomeprazole (NEXIUM) 40 MG capsule Take 40 mg by mouth daily before breakfast.     famotidine  (PEPCID ) 20 MG tablet Take 20 mg by mouth at bedtime.     FLONASE ALLERGY RELIEF 50 MCG/ACT nasal spray Place 1-2 sprays into both nostrils in the morning and at bedtime.     JARDIANCE 10 MG TABS tablet Take 10 mg by mouth daily.  lenalidomide  (REVLIMID ) 25 MG capsule Take 1 capsule (25 mg total) by mouth daily. For 21 days and off for 7 days for a 28 day cycle.  [employer] Auth # [id number]     Date Obtained 04/10/2024 21 capsule 0   [Paused] losartan (COZAAR) 100 MG tablet Take 50 mg by mouth daily.     Multiple Vitamin (MULTIVITAMIN) tablet Take 1 tablet by mouth daily with breakfast.     ONETOUCH VERIO test strip      oxyCODONE  (OXY IR/ROXICODONE ) 5 MG immediate release tablet Take 1 tablet (5 mg total) by mouth every 6  (six) hours as needed for severe pain (pain score 7-10). 12 tablet 0   rosuvastatin  (CRESTOR ) 10 MG tablet Take 10 mg by mouth daily.     sildenafil (VIAGRA) 100 MG tablet Take 100 mg by mouth daily as needed (for E.D.).     traMADol  (ULTRAM ) 50 MG tablet Take 1 tablet (50 mg total) by mouth every 6 (six) hours as needed. 30 tablet 0   No current facility-administered medications for this encounter.     REVIEW OF SYSTEMS: On review of systems, the patient reports that lower-mid back pain. He states that the pain is 6/10. He is taking oxycodone  approximately BID for this pain. The pain is constant and worse with standing up. He reports not being able to get to a comfortable position. He also notes diffuse pain along the center of his chest and upper abdomen. He denies pain elsewhere.      PHYSICAL EXAM:  Wt Readings from Last 3 Encounters:  04/11/24 161 lb 12.8 oz (73.4 kg)  04/09/24 164 lb 9.6 oz (74.7 kg)  03/31/24 165 lb 9.1 oz (75.1 kg)   Temp Readings from Last 3 Encounters:  04/11/24 97.8 F (36.6 C)  04/09/24 98.5 F (36.9 C) (Temporal)  03/31/24 97.7 F (36.5 C) (Oral)   BP Readings from Last 3 Encounters:  04/11/24 131/65  04/09/24 136/64  03/31/24 110/66   Pulse Readings from Last 3 Encounters:  04/11/24 62  04/09/24 63  03/31/24 85   Pain Assessment Pain Score: 0-No pain/10  In general this is a well appearing male in no acute distress. He's alert and oriented x4 and appropriate throughout the examination. Cardiopulmonary assessment is negative for acute distress and he exhibits normal effort.   No tenderness to palpation along the chest wall, specifically the right upper chest.     ECOG = 2  0 - Asymptomatic (Fully active, able to carry on all predisease activities without restriction)  1 - Symptomatic but completely ambulatory (Restricted in physically strenuous activity but ambulatory and able to carry out work of a light or sedentary nature. For  example, light housework, office work)  2 - Symptomatic, <50% in bed during the day (Ambulatory and capable of all self care but unable to carry out any work activities. Up and about more than 50% of waking hours)  3 - Symptomatic, >50% in bed, but not bedbound (Capable of only limited self-care, confined to bed or chair 50% or more of waking hours)  4 - Bedbound (Completely disabled. Cannot carry on any self-care. Totally confined to bed or chair)  5 - Death   Raylene MM, Creech RH, Tormey DC, et al. [phone]). Toxicity and response criteria of the [facility]. Am. DOROTHA Bridges. Oncol. 5 (6): 649-55    LABORATORY DATA:  Lab Results  Component Value Date   WBC 8.9 04/09/2024   HGB 13.3 04/09/2024  HCT 39.2 04/09/2024   MCV 99.2 04/09/2024   PLT 422 (H) 04/09/2024   Lab Results  Component Value Date   NA 141 04/09/2024   K 4.6 04/09/2024   CL 105 04/09/2024   CO2 31 04/09/2024   Lab Results  Component Value Date   ALT 15 04/09/2024   AST 21 04/09/2024   ALKPHOS 193 (H) 04/09/2024   BILITOT 0.5 04/09/2024      RADIOGRAPHY: CT BONE MARROW BIOPSY Result Date: 03/31/2024 INDICATION: 73 year old with multiple lytic bone lesions. Request for bone marrow biopsy and bone lesion biopsy. EXAM: CT GUIDED BONE MARROW ASPIRATES AND BIOPSY CT-GUIDED CORE BIOPSY OF LEFT ILIAC BONE LESION Physician: Juliene SAUNDERS. Henn, MD MEDICATIONS: Moderate sedation ANESTHESIA/SEDATION: Moderate (conscious) sedation was employed during this procedure. A total of Versed  2 mg and fentanyl  75 mcg was administered intravenously at the order of the provider performing the procedure. Total intra-service moderate sedation time: 29 minutes. Patient's level of consciousness and vital signs were monitored continuously by radiology nurse throughout the procedure under the supervision of the provider performing the procedure. COMPLICATIONS: None immediate. PROCEDURE: The procedure was explained to the patient.  The risks and benefits of the procedure were discussed and the patient's questions were addressed. Informed consent was obtained from the patient. The patient was placed prone on CT table. Images of the pelvis were obtained. The back was prepped and draped in sterile fashion. Initially, attention was directed to a lytic lesion in the left iliac bone. Left side of the back was anesthetized with 1% lidocaine . Small incision was made. Using CT guidance, 17 gauge coaxial needle was directed into the lytic bone lesion. Several core biopsies were obtained with 18 gauge core device. Specimens placed in formalin. Attention was directed to the bone marrow biopsy. The skin and right posterior ilium were anesthetized with 1% lidocaine . 11 gauge bone needle was directed into the right ilium with CT guidance. Two aspirates and 2 core biopsies were obtained. Bandage placed over the puncture sites. FINDINGS: Core biopsies were obtained from a destructive lytic lesion in the upper left posterior iliac bone. Biopsy needle confirmed within the lesion. Bone marrow biopsy needle directed into the posterior right ilium. IMPRESSION: 1. CT-guided core biopsy of a lytic bone lesion in the left ilium. 2. CT-guided bone marrow aspiration and biopsy from the right iliac bone. Electronically Signed   By: Juliene Balder M.D.   On: 03/31/2024 14:29   CT BIOPSY Result Date: 03/31/2024 INDICATION: 73 year old with multiple lytic bone lesions. Request for bone marrow biopsy and bone lesion biopsy. EXAM: CT GUIDED BONE MARROW ASPIRATES AND BIOPSY CT-GUIDED CORE BIOPSY OF LEFT ILIAC BONE LESION Physician: Juliene SAUNDERS. Henn, MD MEDICATIONS: Moderate sedation ANESTHESIA/SEDATION: Moderate (conscious) sedation was employed during this procedure. A total of Versed  2 mg and fentanyl  75 mcg was administered intravenously at the order of the provider performing the procedure. Total intra-service moderate sedation time: 29 minutes. Patient's level of consciousness  and vital signs were monitored continuously by radiology nurse throughout the procedure under the supervision of the provider performing the procedure. COMPLICATIONS: None immediate. PROCEDURE: The procedure was explained to the patient. The risks and benefits of the procedure were discussed and the patient's questions were addressed. Informed consent was obtained from the patient. The patient was placed prone on CT table. Images of the pelvis were obtained. The back was prepped and draped in sterile fashion. Initially, attention was directed to a lytic lesion in the left iliac bone. Left side  of the back was anesthetized with 1% lidocaine . Small incision was made. Using CT guidance, 17 gauge coaxial needle was directed into the lytic bone lesion. Several core biopsies were obtained with 18 gauge core device. Specimens placed in formalin. Attention was directed to the bone marrow biopsy. The skin and right posterior ilium were anesthetized with 1% lidocaine . 11 gauge bone needle was directed into the right ilium with CT guidance. Two aspirates and 2 core biopsies were obtained. Bandage placed over the puncture sites. FINDINGS: Core biopsies were obtained from a destructive lytic lesion in the upper left posterior iliac bone. Biopsy needle confirmed within the lesion. Bone marrow biopsy needle directed into the posterior right ilium. IMPRESSION: 1. CT-guided core biopsy of a lytic bone lesion in the left ilium. 2. CT-guided bone marrow aspiration and biopsy from the right iliac bone. Electronically Signed   By: Juliene Balder M.D.   On: 03/31/2024 14:29   MR CERVICAL SPINE WO CONTRAST Result Date: 03/29/2024 CLINICAL DATA:  Provided history: Bilateral hand paresthesia. History of osseous metastases. Evaluate for compressive cervical spine lesion. EXAM: MRI CERVICAL SPINE WITHOUT CONTRAST TECHNIQUE: Multiplanar, multisequence MR imaging of the cervical spine was performed. No intravenous contrast was administered.  COMPARISON:  None. FINDINGS: Alignment: No significant spondylolisthesis. Vertebrae: Multifocal signal abnormality within the cervical spine and within visible portions of the skull base, calvarium, thoracic spine and ribs consistent with widespread osseous metastatic disease. Notably, there are metastases within the C4 and C6 vertebrae which involve the superior endplates at these levels (for instance as seen on series 6, images 8 and 9). Cord: No signal abnormality identified within the cervical spinal cord. Posterior Fossa, vertebral arteries, paraspinal tissues: Flow voids preserved within visible portions of the cervical vertebral arteries. No paraspinal mass or collection. Disc levels: No more than mild disc degeneration within the cervical spine. C2-C3: No significant disc herniation or spinal canal stenosis. Uncovertebral hypertrophy on the right resulting in moderate right neural foraminal narrowing. C3-C4: Small central disc protrusion. Uncovertebral hypertrophy on the right. The disc protrusion minimally effaces the thecal sac (without significant spinal canal stenosis). Mild right neural foraminal narrowing. C4-C5: Slight disc bulge. Mild facet hypertrophy on the left. Slight ligamentum flavum thickening. No significant spinal canal stenosis. Mild left neural foraminal narrowing. C5-C6: Slight disc bulge. Minimal facet hypertrophy. Mild ligamentum flavum thickening. No significant spinal canal or neural foraminal narrowing. C6-C7: Slight disc bulge. Uncovertebral hypertrophy (greater on the left). No significant spinal canal stenosis. Moderate left neural foraminal narrowing. C7-T1: No significant disc herniation or stenosis. IMPRESSION: 1. 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 Of note, there are metastases within the C4 and C6 vertebrae which involve the superior endplates at these levels. No  tumor encroachment upon the cervical spinal canal or neural foramina. 2. Cervical spondylosis as outlined within the body of the report. No significant spinal canal stenosis. Multilevel neural foraminal stenosis, greatest on the right at C2-C3 and on the left at C6-C7 (moderate at these sites). Electronically Signed   By: Rockey Childs D.O.   On: 03/29/2024 14:31   MR ABDOMEN WWO CONTRAST Result Date: 03/28/2024 CLINICAL DATA:  New diagnosis of multiple bone metastases. Screening for primary neoplastic process. EXAM: MRI ABDOMEN AND PELVIS WITHOUT AND WITH CONTRAST TECHNIQUE: Multiplanar multisequence MR imaging of the abdomen and pelvis was performed both before and after the administration of intravenous contrast. CONTRAST:  7mL GADAVIST  GADOBUTROL  1  MMOL/ML IV SOLN COMPARISON:  None Available. FINDINGS: COMBINED FINDINGS FOR BOTH MR ABDOMEN AND PELVIS Lower chest: Unremarkable MR appearance to the lung bases. No pleural effusion. No pericardial effusion. Normal heart size. Hepatobiliary: The liver is normal in size and configuration. No intrahepatic or extrahepatic bile duct dilatation. No choledocholithiasis. Small volume dependent gallstones noted without imaging signs of acute cholecystitis. Pancreas: No mass, inflammatory changes or other parenchymal abnormality identified. No main pancreatic duct dilation. Spleen:  Within normal limits in size and appearance. No focal mass. Adrenals/Urinary Tract: Unremarkable adrenal glands. No hydroureteronephrosis. Simple cyst noted in the right kidney lower pole measuring 1.5 x 1.6 cm. There is a smaller simple cyst in the left kidney upper pole, laterally. Stomach/Bowel: Visualized portions within the abdomen are unremarkable. No disproportionate dilation of bowel loops. Multiple colonic diverticula noted without diverticulitis. Vascular/Lymphatic: No pathologically enlarged lymph nodes identified. No abdominal aortic aneurysm demonstrated. No ascites. Reproductive:  The prostate is not well evaluated on this exam. However, there is heterogeneous transitional zone, especially near the apex. Correlation with serum PSA is recommended to determine the need for dedicated pelvic MRI as per prostate protocol. Other:  None. Musculoskeletal: There are extensive mixed T2 hypointense and hyperintense lesions throughout the imaged bones compatible with metastases. There are several rib lesions with associated soft tissue component, as seen on the coronal images, better evaluated on the recent chest CT scan. IMPRESSION: 1. Extensive osseous metastases. There are several rib lesions with associated soft tissue component, better evaluated on the recent chest CT scan. 2. No primary neoplastic process identified in the abdomen or pelvis. 3. Heterogeneous prostate transitional zone, especially near the apex. Correlation with serum PSA is recommended to determine the need for dedicated pelvic MRI as per prostate protocol. 4. Cholelithiasis without imaging signs of acute cholecystitis. 5. Multiple other nonacute observations, as described above. Electronically Signed   By: Ree Molt M.D.   On: 03/28/2024 18:53   MR PELVIS W WO CONTRAST Result Date: 03/28/2024 CLINICAL DATA:  New diagnosis of multiple bone metastases. Screening for primary neoplastic process. EXAM: MRI ABDOMEN AND PELVIS WITHOUT AND WITH CONTRAST TECHNIQUE: Multiplanar multisequence MR imaging of the abdomen and pelvis was performed both before and after the administration of intravenous contrast. CONTRAST:  7mL GADAVIST  GADOBUTROL  1 MMOL/ML IV SOLN COMPARISON:  None Available. FINDINGS: COMBINED FINDINGS FOR BOTH MR ABDOMEN AND PELVIS Lower chest: Unremarkable MR appearance to the lung bases. No pleural effusion. No pericardial effusion. Normal heart size. Hepatobiliary: The liver is normal in size and configuration. No intrahepatic or extrahepatic bile duct dilatation. No choledocholithiasis. Small volume dependent  gallstones noted without imaging signs of acute cholecystitis. Pancreas: No mass, inflammatory changes or other parenchymal abnormality identified. No main pancreatic duct dilation. Spleen:  Within normal limits in size and appearance. No focal mass. Adrenals/Urinary Tract: Unremarkable adrenal glands. No hydroureteronephrosis. Simple cyst noted in the right kidney lower pole measuring 1.5 x 1.6 cm. There is a smaller simple cyst in the left kidney upper pole, laterally. Stomach/Bowel: Visualized portions within the abdomen are unremarkable. No disproportionate dilation of bowel loops. Multiple colonic diverticula noted without diverticulitis. Vascular/Lymphatic: No pathologically enlarged lymph nodes identified. No abdominal aortic aneurysm demonstrated. No ascites. Reproductive: The prostate is not well evaluated on this exam. However, there is heterogeneous transitional zone, especially near the apex. Correlation with serum PSA is recommended to determine the need for dedicated pelvic MRI as per prostate protocol. Other:  None. Musculoskeletal: There are extensive mixed T2 hypointense and  hyperintense lesions throughout the imaged bones compatible with metastases. There are several rib lesions with associated soft tissue component, as seen on the coronal images, better evaluated on the recent chest CT scan. IMPRESSION: 1. Extensive osseous metastases. There are several rib lesions with associated soft tissue component, better evaluated on the recent chest CT scan. 2. No primary neoplastic process identified in the abdomen or pelvis. 3. Heterogeneous prostate transitional zone, especially near the apex. Correlation with serum PSA is recommended to determine the need for dedicated pelvic MRI as per prostate protocol. 4. Cholelithiasis without imaging signs of acute cholecystitis. 5. Multiple other nonacute observations, as described above. Electronically Signed   By: Ree Molt M.D.   On: 03/28/2024 18:53    CT CHEST W CONTRAST Result Date: 03/26/2024 CLINICAL DATA:  Abnormal chest x-ray. Pain in left side of lungs. * Tracking Code: BO * EXAM: CT CHEST WITH CONTRAST TECHNIQUE: Multidetector CT imaging of the chest was performed during intravenous contrast administration. RADIATION DOSE REDUCTION: This exam was performed according to the departmental dose-optimization program which includes automated exposure control, adjustment of the mA and/or kV according to patient size and/or use of iterative reconstruction technique. CONTRAST:  100mL ISOVUE -300 IOPAMIDOL  (ISOVUE -300) INJECTION 61% COMPARISON:  None Available. FINDINGS: Cardiovascular: Normal cardiac size. No pericardial effusion. No aortic aneurysm. There are coronary artery calcifications, in keeping with coronary artery disease. There are also mild peripheral atherosclerotic vascular calcifications of thoracic aorta and its major branches. Mediastinum/Nodes: Visualized thyroid  gland appears grossly unremarkable. No solid / cystic mediastinal masses. The esophagus is nondistended precluding optimal assessment. No axillary, mediastinal or hilar lymphadenopathy by size criteria. Lungs/Pleura: The central tracheo-bronchial tree is patent. No mass or consolidation. No pleural effusion or pneumothorax. No suspicious lung nodules. Upper Abdomen: Small volume calcified gallstones noted without imaging signs of acute cholecystitis. Remaining visualized upper abdominal viscera within normal limits. Musculoskeletal: The visualized soft tissues of the chest wall are grossly unremarkable. There are extensive lytic lesions throughout the imaged bones, compatible with metastases. There is also associated destruction as well as expansile lytic lesions with soft tissue in several ribs including largest lesion along the anterolateral right fourth rib with soft tissue measuring up to 2.0 x 2.9 cm. There are mild multilevel degenerative changes in the visualized spine.  IMPRESSION: 1. There are extensive lytic lesions throughout the imaged bones, compatible with metastases. There is also associated destruction as well as expansile lytic lesions with soft tissue in several ribs including largest lesion along the anterolateral right fourth rib with soft tissue measuring up to 2.0 x 2.9 cm. 2. No lung mass, consolidation, pleural effusion or pneumothorax. 3. Multiple other nonacute observations, as described above. Aortic Atherosclerosis (ICD10-I70.0). Electronically Signed   By: Ree Molt M.D.   On: 03/26/2024 16:32   DG Chest 2 View Result Date: 03/25/2024 CLINICAL DATA:  Chest pain for 3 weeks. EXAM: CHEST - 2 VIEW COMPARISON:  None Available. FINDINGS: The heart size and mediastinal contours are within normal limits. There appears to be a pleural based mass in the right midlung. There is also the suggestion of possible lytic lesion involving the right eighth rib laterally as well as possible other lytic lesions involving the right ribs and possibly scapula. No definite consolidative process is noted. IMPRESSION: Probable pleural based mass seen in right midlung with possible lytic lesions involving multiple right ribs and right scapula. CT scan of the chest is recommended for further evaluation. These results will be called to the ordering clinician  or representative by the Radiologist Assistant, and communication documented in the PACS or zVision Dashboard. Electronically Signed   By: Lynwood Landy Raddle M.D.   On: 03/25/2024 17:03       IMPRESSION/PLAN: 1. Multiple Myeloma,  IgG kappa type, with painful sacral lesions and a right fourth rib lesion.   We reviewed this patient's current workup.  He presents today with multiple myeloma with newly diagnosed multiple myeloma with diffuse bone involvement and plasmacytomas. He is currently experiencing lower back pain, interfering with his quality of life. Dr. Dewey recommends palliative radiation to the sacrum to help  control his pain.   Imaging also demonstrates a right fourth rib expansile lytic lesion. He is currently asymptomatic from this area, but Dr. Dewey recommends palliative radiation in order to reduce the risk of disease progression. Patient expressed understanding and is in agreement with this plan.   Today, I talked to the patient and family about the findings and work-up thus far.  We discussed the natural history of multiple myeloma and general treatment, highlighting the role of radiotherapy in the management.  We discussed the available radiation techniques, and focused on the details of logistics and delivery.  We reviewed the anticipated acute and late sequelae associated with radiation in this setting.  The patient was encouraged to ask questions that I answered to the best of my ability. A patient consent form was discussed and signed.  We retained a copy for our records.  The patient would like to proceed with radiation.  Patient is scheduled for CT simulation later today. Dr. Dewey anticipates a palliative course of 30 Gy in 10 fractions to the right fourth rib lesion and sacrum. We look forward to participating in this patient's care.    In a visit lasting 60 minutes, greater than 50% of the time was spent face to face discussing the patient's condition, in preparation for the discussion, and coordinating the patient's care.   The above documentation reflects my direct findings during this shared patient visit. Please see the separate note by Dr. Dewey on this date for the remainder of the patient's plan of care.    Leeroy Due, PA-C    **Disclaimer: This note was dictated with voice recognition software. Similar sounding words can inadvertently be transcribed and this note may contain transcription errors which may not have been corrected upon publication of note.**

## 2024-04-12 MED ORDER — ACYCLOVIR 400 MG PO TABS: 400.0000 mg | ORAL_TABLET | Freq: Two times a day (BID) | ORAL | 11 refills | Status: DC

## 2024-04-12 MED ORDER — ONDANSETRON HCL 8 MG PO TABS: 8.0000 mg | ORAL_TABLET | Freq: Three times a day (TID) | ORAL | 1 refills | Status: DC | PRN

## 2024-04-12 NOTE — Addendum Note (Signed)
 Addended by: LANNY CALLANDER on: 04/12/2024 03:11 PM   Modules accepted: Orders

## 2024-04-13 ENCOUNTER — Other Ambulatory Visit: Payer: Self-pay

## 2024-04-14 ENCOUNTER — Encounter: Payer: Self-pay | Admitting: Hematology

## 2024-04-14 ENCOUNTER — Inpatient Hospital Stay: Payer: PRIVATE HEALTH INSURANCE

## 2024-04-14 ENCOUNTER — Other Ambulatory Visit: Payer: Self-pay | Admitting: Hematology

## 2024-04-14 DIAGNOSIS — Z5112 Encounter for antineoplastic immunotherapy: Secondary | ICD-10-CM | POA: Diagnosis not present

## 2024-04-14 DIAGNOSIS — Z510 Encounter for antineoplastic radiation therapy: Secondary | ICD-10-CM | POA: Diagnosis not present

## 2024-04-14 DIAGNOSIS — E1122 Type 2 diabetes mellitus with diabetic chronic kidney disease: Secondary | ICD-10-CM | POA: Diagnosis not present

## 2024-04-14 DIAGNOSIS — C9000 Multiple myeloma not having achieved remission: Secondary | ICD-10-CM | POA: Diagnosis not present

## 2024-04-14 DIAGNOSIS — N189 Chronic kidney disease, unspecified: Secondary | ICD-10-CM | POA: Diagnosis not present

## 2024-04-14 LAB — CBC WITH DIFFERENTIAL/PLATELET
Abs Immature Granulocytes: 0.02 K/uL (ref 0.00–0.07)
Basophils Absolute: 0.1 K/uL (ref 0.0–0.1)
Basophils Relative: 1 %
Eosinophils Absolute: 0.2 K/uL (ref 0.0–0.5)
Eosinophils Relative: 3 %
HCT: 39.4 % (ref 39.0–52.0)
Hemoglobin: 13.4 g/dL (ref 13.0–17.0)
Immature Granulocytes: 0 %
Lymphocytes Relative: 37 %
Lymphs Abs: 3 K/uL (ref 0.7–4.0)
MCH: 33.7 pg (ref 26.0–34.0)
MCHC: 34 g/dL (ref 30.0–36.0)
MCV: 99 fL (ref 80.0–100.0)
Monocytes Absolute: 0.5 K/uL (ref 0.1–1.0)
Monocytes Relative: 7 %
Neutro Abs: 4.3 K/uL (ref 1.7–7.7)
Neutrophils Relative %: 52 %
Platelets: 368 K/uL (ref 150–400)
RBC: 3.98 MIL/uL — ABNORMAL LOW (ref 4.22–5.81)
RDW: 13.2 % (ref 11.5–15.5)
WBC: 8.2 K/uL (ref 4.0–10.5)
nRBC: 0 % (ref 0.0–0.2)

## 2024-04-14 LAB — COMPREHENSIVE METABOLIC PANEL WITH GFR
ALT: 14 U/L (ref 0–44)
AST: 25 U/L (ref 15–41)
Albumin: 3.8 g/dL (ref 3.5–5.0)
Alkaline Phosphatase: 196 U/L — ABNORMAL HIGH (ref 38–126)
Anion gap: 4 — ABNORMAL LOW (ref 5–15)
BUN: 13 mg/dL (ref 8–23)
CO2: 32 mmol/L (ref 22–32)
Calcium: 9.4 mg/dL (ref 8.9–10.3)
Chloride: 105 mmol/L (ref 98–111)
Creatinine, Ser: 1.01 mg/dL (ref 0.61–1.24)
GFR, Estimated: >60 mL/min (ref >60)
Glucose, Bld: 120 mg/dL — ABNORMAL HIGH (ref 70–99)
Potassium: 4.8 mmol/L (ref 3.5–5.1)
Sodium: 141 mmol/L (ref 135–145)
Total Bilirubin: 0.4 mg/dL (ref 0.0–1.2)
Total Protein: 7.9 g/dL (ref 6.5–8.1)

## 2024-04-14 NOTE — Progress Notes (Signed)
 Received call from patient after receiving my card per my request.  Introduced myself as Dance movement psychotherapist and to ask if he had any financial questions or concerns. He states he does not. Patient has 2 insurances and states he does not have concerns regarding personal bills.  He has my card for any additional financial questions or concerns.

## 2024-04-15 ENCOUNTER — Other Ambulatory Visit: Payer: Self-pay

## 2024-04-15 MED ORDER — LENALIDOMIDE 25 MG PO CAPS: 25.0000 mg | ORAL_CAPSULE | Freq: Every day | ORAL | 0 refills | Status: DC

## 2024-04-15 NOTE — Progress Notes (Signed)
 Pharmacist Chemotherapy Monitoring - Initial Assessment    Anticipated start date: 04/23/24   The following has been reviewed per standard work regarding the patient's treatment regimen: The patient's diagnosis, treatment plan and drug doses, and organ/hematologic function Lab orders and baseline tests specific to treatment regimen  The treatment plan start date, drug sequencing, and pre-medications Prior authorization status  Patient's documented medication list, including drug-drug interaction screen and prescriptions for anti-emetics and supportive care specific to the treatment regimen The drug concentrations, fluid compatibility, administration routes, and timing of the medications to be used The patient's access for treatment and lifetime cumulative dose history, if applicable  The patient's medication allergies and previous infusion related reactions, if applicable   Changes made to treatment plan:  N/A  Follow up needed:  T&S, RBC phenotype, take home meds released from tx plan  Ramez Arrona, PharmD, MBA

## 2024-04-16 NOTE — Telephone Encounter (Signed)
 Oral Chemotherapy Pharmacist Encounter  I spoke with patient's niece, [name] Ku, for overview of: Revlimid  (lenalidomide ) for the treatment of IgG kappa multiple myeloma in conjunction with daratumumab and dexamethasone, planned duration until disease progression or unacceptable drug toxicity.  Treatment goal: Palliative  Counseled patient's niece on administration, dosing, side effects, monitoring, drug-food interactions, safe handling, storage, and disposal.  Patient will take Revlimid  25mg  capsules, 1 capsule by mouth once daily, without regard to food, with a full glass of water.  Revlimid  will be given 21 days on, 7 days off, repeat every 28 days.  Patient will receive dexamethasone in clinic prior to daratumumab for cycles 1 and 2. Patient will need take home dexamethasone prescribed for cycle 3 and thereafter.   Revlimid  start date: 04/23/24 PM   Patient's niece will call [employer] today (tele: [phone]) to set up shipment of medication to their home.   Adverse effects of Revlimid  include but are not limited to: nausea, rash, fatigue, and decreased blood counts.   VZV PPX: Patient's niece endorsed she has already picked up acyclovir  for patient and he has already started this. I have added the acyclovir  back to patient's medication list.  VTE PPX: Counseled patient's niece on starting aspirin 81 mg PO daily while patient is on Revlimid  due to risk of blood clots. Patient's niece expressed understanding and will have patietn start this.  N/V PPX: Patient's niece stated she has picked up ondansetron  from patient's [facility] and knows patient can take this PRN N/V.  Reviewed importance of keeping a medication schedule and plan for any missed doses. No barriers to medication adherence identified.  Medication reconciliation performed and medication/allergy list updated.  Distress thermometer flowsheet: Distress thermometer not completed during telephone call as this has already been  completed on 04/14/24.   Communication and Learning Assessment Primary learner: Patient's niece Barriers to learning: No barriers Preferred language: English Learning preferences: Listening Reading  All questions answered.  Patient's niece voiced understanding and appreciation.   Medication education handout placed in mail for patient and patient's niece. Patient's niece knows to call the office with questions or concerns. Oral Chemotherapy Clinic phone number provided.   Asberry Macintosh, PharmD, BCPS, BCOP Hematology/Oncology Clinical Pharmacist [phone] 04/16/2024 10:22 AM

## 2024-04-17 ENCOUNTER — Other Ambulatory Visit: Payer: Self-pay | Admitting: Hematology

## 2024-04-17 DIAGNOSIS — C9000 Multiple myeloma not having achieved remission: Secondary | ICD-10-CM

## 2024-04-18 ENCOUNTER — Encounter (HOSPITAL_COMMUNITY)
Admission: RE | Admit: 2024-04-18 | Discharge: 2024-04-18 | Disposition: A | Discharge status: Home / Self Care | Source: Ambulatory Visit | Attending: Hematology | Admitting: Hematology

## 2024-04-18 DIAGNOSIS — C9000 Multiple myeloma not having achieved remission: Secondary | ICD-10-CM | POA: Insufficient documentation

## 2024-04-18 DIAGNOSIS — Z23 Encounter for immunization: Secondary | ICD-10-CM | POA: Diagnosis not present

## 2024-04-18 LAB — GLUCOSE, CAPILLARY: Glucose-Capillary: 97 mg/dL (ref 70–99)

## 2024-04-18 MED ORDER — FLUDEOXYGLUCOSE F - 18 (FDG) INJECTION
8.0400 | Freq: Once | INTRAVENOUS | Status: AC | PRN
  Administered 2024-04-18: 8.04 via INTRAVENOUS

## 2024-04-21 DIAGNOSIS — E1122 Type 2 diabetes mellitus with diabetic chronic kidney disease: Secondary | ICD-10-CM | POA: Diagnosis not present

## 2024-04-21 DIAGNOSIS — C9000 Multiple myeloma not having achieved remission: Secondary | ICD-10-CM | POA: Diagnosis not present

## 2024-04-21 DIAGNOSIS — Z510 Encounter for antineoplastic radiation therapy: Secondary | ICD-10-CM | POA: Diagnosis not present

## 2024-04-21 DIAGNOSIS — Z5112 Encounter for antineoplastic immunotherapy: Secondary | ICD-10-CM | POA: Diagnosis not present

## 2024-04-21 DIAGNOSIS — N189 Chronic kidney disease, unspecified: Secondary | ICD-10-CM | POA: Diagnosis not present

## 2024-04-22 ENCOUNTER — Encounter (HOSPITAL_COMMUNITY): Payer: Self-pay

## 2024-04-22 ENCOUNTER — Encounter: Payer: Self-pay | Admitting: Hematology

## 2024-04-22 ENCOUNTER — Other Ambulatory Visit: Payer: Self-pay

## 2024-04-22 ENCOUNTER — Other Ambulatory Visit: Payer: Self-pay | Admitting: Hematology

## 2024-04-22 ENCOUNTER — Ambulatory Visit
Admission: RE | Admit: 2024-04-22 | Discharge: 2024-04-22 | Disposition: A | Discharge status: Home / Self Care | Source: Ambulatory Visit | Attending: Radiation Oncology | Admitting: Radiation Oncology

## 2024-04-22 DIAGNOSIS — C9000 Multiple myeloma not having achieved remission: Secondary | ICD-10-CM | POA: Diagnosis not present

## 2024-04-22 DIAGNOSIS — E1122 Type 2 diabetes mellitus with diabetic chronic kidney disease: Secondary | ICD-10-CM | POA: Diagnosis not present

## 2024-04-22 DIAGNOSIS — Z510 Encounter for antineoplastic radiation therapy: Secondary | ICD-10-CM | POA: Diagnosis not present

## 2024-04-22 DIAGNOSIS — N189 Chronic kidney disease, unspecified: Secondary | ICD-10-CM | POA: Diagnosis not present

## 2024-04-22 DIAGNOSIS — Z5112 Encounter for antineoplastic immunotherapy: Secondary | ICD-10-CM | POA: Diagnosis not present

## 2024-04-22 LAB — RAD ONC ARIA SESSION SUMMARY
Course Elapsed Days: 0
Plan Fractions Treated to Date: 1
Plan Fractions Treated to Date: 1
Plan Prescribed Dose Per Fraction: 2.5 Gy
Plan Prescribed Dose Per Fraction: 2.5 Gy
Plan Total Fractions Prescribed: 10
Plan Total Fractions Prescribed: 10
Plan Total Prescribed Dose: 25 Gy
Plan Total Prescribed Dose: 25 Gy
Reference Point Dosage Given to Date: 2.5 Gy
Reference Point Dosage Given to Date: 2.5 Gy
Reference Point Session Dosage Given: 2.5 Gy
Reference Point Session Dosage Given: 2.5 Gy
Session Number: 1

## 2024-04-22 NOTE — Assessment & Plan Note (Signed)
-  IgG kappa type, stage I - Patient with admitted to the hospital on March 29, 2023 for hypercalcemia and diffuse lytic bone lesions on CT and MRI. -Bone marrow biopsy showed plasmacytoma.  Due to the sample limitation, the accurate percentage of plasma cell cannot be determined -Multiple myeloma showed IgG kappa monoclonal protein 1.1g/dl, elevated serum IgG and kappa light chain level with ratio 4.2.  Beta-2 microglobulin 3.0, LDH normal. -Based on the above lab results, diffuse bone lesions, hypercalcemia, mild renal insufficiency and the bone marrow biopsy, patient meets the criteria for multiple myeloma -I recommended induction chemotherapy Dara, Revlimid  and dexa

## 2024-04-23 ENCOUNTER — Other Ambulatory Visit: Payer: Self-pay | Admitting: Hematology

## 2024-04-23 ENCOUNTER — Inpatient Hospital Stay

## 2024-04-23 ENCOUNTER — Encounter: Payer: Self-pay | Admitting: Hematology

## 2024-04-23 ENCOUNTER — Other Ambulatory Visit: Payer: Self-pay

## 2024-04-23 ENCOUNTER — Ambulatory Visit
Admission: RE | Admit: 2024-04-23 | Discharge: 2024-04-23 | Discharge status: Home / Self Care | Source: Ambulatory Visit | Attending: Radiation Oncology

## 2024-04-23 ENCOUNTER — Telehealth: Payer: Self-pay

## 2024-04-23 ENCOUNTER — Inpatient Hospital Stay (HOSPITAL_BASED_OUTPATIENT_CLINIC_OR_DEPARTMENT_OTHER): Admitting: Hematology

## 2024-04-23 VITALS — BP 121/57 | HR 56 | Temp 97.2°F | Resp 18 | Wt 157.0 lb

## 2024-04-23 DIAGNOSIS — C9000 Multiple myeloma not having achieved remission: Secondary | ICD-10-CM

## 2024-04-23 DIAGNOSIS — Z5112 Encounter for antineoplastic immunotherapy: Secondary | ICD-10-CM | POA: Diagnosis not present

## 2024-04-23 DIAGNOSIS — N189 Chronic kidney disease, unspecified: Secondary | ICD-10-CM | POA: Diagnosis not present

## 2024-04-23 DIAGNOSIS — Z510 Encounter for antineoplastic radiation therapy: Secondary | ICD-10-CM | POA: Diagnosis not present

## 2024-04-23 DIAGNOSIS — E1122 Type 2 diabetes mellitus with diabetic chronic kidney disease: Secondary | ICD-10-CM | POA: Diagnosis not present

## 2024-04-23 LAB — RAD ONC ARIA SESSION SUMMARY
Course Elapsed Days: 1
Plan Fractions Treated to Date: 2
Plan Fractions Treated to Date: 2
Plan Prescribed Dose Per Fraction: 2.5 Gy
Plan Prescribed Dose Per Fraction: 2.5 Gy
Plan Total Fractions Prescribed: 10
Plan Total Fractions Prescribed: 10
Plan Total Prescribed Dose: 25 Gy
Plan Total Prescribed Dose: 25 Gy
Reference Point Dosage Given to Date: 5 Gy
Reference Point Dosage Given to Date: 5 Gy
Reference Point Session Dosage Given: 2.5 Gy
Reference Point Session Dosage Given: 2.5 Gy
Session Number: 2

## 2024-04-23 LAB — CBC WITH DIFFERENTIAL (CANCER CENTER ONLY)
Abs Immature Granulocytes: 0.02 K/uL (ref 0.00–0.07)
Basophils Absolute: 0.1 K/uL (ref 0.0–0.1)
Basophils Relative: 1 %
Eosinophils Absolute: 0.2 K/uL (ref 0.0–0.5)
Eosinophils Relative: 3 %
HCT: 39.7 % (ref 39.0–52.0)
Hemoglobin: 13.4 g/dL (ref 13.0–17.0)
Immature Granulocytes: 0 %
Lymphocytes Relative: 36 %
Lymphs Abs: 2.6 K/uL (ref 0.7–4.0)
MCH: 33.3 pg (ref 26.0–34.0)
MCHC: 33.8 g/dL (ref 30.0–36.0)
MCV: 98.5 fL (ref 80.0–100.0)
Monocytes Absolute: 0.5 K/uL (ref 0.1–1.0)
Monocytes Relative: 8 %
Neutro Abs: 3.8 K/uL (ref 1.7–7.7)
Neutrophils Relative %: 52 %
Platelet Count: 236 K/uL (ref 150–400)
RBC: 4.03 MIL/uL — ABNORMAL LOW (ref 4.22–5.81)
RDW: 13.3 % (ref 11.5–15.5)
WBC Count: 7.2 K/uL (ref 4.0–10.5)
nRBC: 0 % (ref 0.0–0.2)

## 2024-04-23 LAB — TYPE AND SCREEN
ABO/RH(D): A POS
Antibody Screen: NEGATIVE

## 2024-04-23 MED ORDER — DARATUMUMAB-HYALURONIDASE-FIHJ 1800-30000 MG-UT/15ML ~~LOC~~ SOLN
1800.0000 mg | Freq: Once | SUBCUTANEOUS | Status: AC
  Administered 2024-04-23: 1800 mg via SUBCUTANEOUS
  Filled 2024-04-23: qty 15

## 2024-04-23 MED ORDER — ONDANSETRON HCL 8 MG PO TABS: 8.0000 mg | ORAL_TABLET | Freq: Three times a day (TID) | ORAL | 1 refills | Status: AC | PRN

## 2024-04-23 MED ORDER — MONTELUKAST SODIUM 10 MG PO TABS
10.0000 mg | ORAL_TABLET | Freq: Once | ORAL | Status: AC
  Administered 2024-04-23: 10 mg via ORAL
  Filled 2024-04-23: qty 1

## 2024-04-23 MED ORDER — ACYCLOVIR 400 MG PO TABS: 400.0000 mg | ORAL_TABLET | Freq: Two times a day (BID) | ORAL | 11 refills | Status: DC

## 2024-04-23 MED ORDER — DEXAMETHASONE 4 MG PO TABS
20.0000 mg | ORAL_TABLET | Freq: Once | ORAL | Status: AC
  Administered 2024-04-23: 20 mg via ORAL
  Filled 2024-04-23: qty 5

## 2024-04-23 MED ORDER — OXYCODONE HCL 5 MG PO TABS: 5.0000 mg | ORAL_TABLET | Freq: Three times a day (TID) | ORAL | 0 refills | Status: DC | PRN

## 2024-04-23 MED ORDER — ACETAMINOPHEN 325 MG PO TABS
650.0000 mg | ORAL_TABLET | Freq: Once | ORAL | Status: AC
  Administered 2024-04-23: 650 mg via ORAL
  Filled 2024-04-23: qty 2

## 2024-04-23 MED ORDER — DIPHENHYDRAMINE HCL 25 MG PO CAPS
25.0000 mg | ORAL_CAPSULE | Freq: Once | ORAL | Status: AC
  Administered 2024-04-23: 25 mg via ORAL
  Filled 2024-04-23: qty 1

## 2024-04-23 NOTE — Progress Notes (Signed)
 [facility]   Telephone:(336) [phone] Fax:(336) [phone]   Clinic Follow up Note   Patient Care Team: [name], MD as PCP - General (Family Medicine)  Date of Service:  04/23/2024  CHIEF COMPLAINT: f/u of multiple myeloma  CURRENT THERAPY:  Daratumumab , Revlimid  and dexamethasone   Oncology History   Multiple myeloma (HCC) -IgG kappa type, stage I - Patient with admitted to the hospital on March 29, 2023 for hypercalcemia and diffuse lytic bone lesions on CT and MRI. -Bone marrow biopsy showed plasmacytoma.  Due to the sample limitation, the accurate percentage of plasma cell cannot be determined -Multiple myeloma showed IgG kappa monoclonal protein 1.1g/dl, elevated serum IgG and kappa light chain level with ratio 4.2.  Beta-2 microglobulin 3.0, LDH normal. -Based on the above lab results, diffuse bone lesions, hypercalcemia, mild renal insufficiency and the bone marrow biopsy, patient meets the criteria for multiple myeloma -I recommended induction chemotherapy Dara, Revlimid  and dexa  Assessment & Plan Multiple myeloma with diffuse bone involvement Multiple myeloma with diffuse bone involvement affecting spine, ribs, pelvis, and other large bones.  I personally reviewed his PET scan with his wife, the formal report still pending.   -Currently undergoing induction chemotherapy and radiation therapy. Disease is not curable but controllable with treatment. Long-term treatment plan includes induction chemotherapy followed by maintenance therapy. Treatment aims to achieve remission and improve quality of life, though relapse is common if treatment is stopped. Compared to other cancers, multiple myeloma is more responsive to chemotherapy and patients often experience significant improvement in quality of life. 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 Pain management is crucial for quality of life. Oxycodone  previously effective for pain control. Informed consent for oxycodone  includes potential for dependence and need for secure storage. Radiation therapy and chemotherapy expected to reduce pain over time. - Refill oxycodone  prescription for pain management - Continue Toradol as needed - Refer to palliative care for additional support - Consider gabapentin or Cymbalta if needed for neuropathic pain  Muscle weakness in legs Muscle weakness in legs, likely secondary to multiple myeloma and treatment effects. Difficulty rising from seated position but able to walk once standing. Physical therapy planned to improve strength. Home exercises encouraged to regain muscle strength. - Initiate physical therapy to improve leg strength - Encourage home exercises twice daily  Gingival bleeding with dental implants Gingival bleeding noted with brushing, possibly related to dental implants. Requires dental evaluation to rule out periodontal issues, especially before starting Zometa  due to risk of jaw necrosis. Dental clearance necessary to proceed with Zometa  treatment. - Provide letter for dental clearance before starting Zometa  - Schedule dental appointment for evaluation and clearance  Plan - Lab reviewed, adequate for treatment, will proceed first dose daratumumab  today, and continue weekly for the next 7 weeks - He will start Revlimid  today at home - Dexamethasone  will be given as a premed in our infusion - He started radiation yesterday, will continue - Follow-up in 2 weeks -  I refilled his oxycodone .  He has been referred to palliative care NP [facility] for pain management.  SUMMARY OF ONCOLOGIC HISTORY: Oncology History  Multiple myeloma (HCC)  04/08/2024 Initial Diagnosis   Multiple myeloma (HCC)   04/23/2024 - 04/23/2024 Chemotherapy   Patient is on Treatment Plan : MYELOMA NEWLY DIAGNOSED Daratumumab  IV + Lenalidomide  + Dexamethasone  Weekly (DaraRd) q28d     04/23/2024 -  Chemotherapy   Patient is on Treatment Plan : MYELOMA RELAPSED REFRACTORY Daratumumab  SQ + Lenalidomide  + Dexamethasone  (DaraRd) q28d        Discussed the use of AI scribe software for clinical note transcription with the patient, who gave verbal consent to proceed.  History of Present Illness Joe [name] is a 73 year old male with multiple myeloma who presents for follow-up and pain management.  He is undergoing radiation therapy for multiple myeloma, having completed two sessions with eight more planned. He experiences significant pain around his tailbone, especially when sitting, which disrupts his sleep and requires frequent position adjustments. He has finished his oxycodone  prescription and is currently taking Toradol, which he finds less effective. He prefers oxycodone  for better pain management and sleep. Tramadol  is also less effective for him.  He experiences a loss of muscle strength in his legs, making it difficult to stand, though he can walk once upright. He has attempted physical therapy but faces scheduling conflicts. He plans to start exercising at home to regain strength.  He is taking Revlimid , started today, once daily for three weeks with a week off, along with acyclovir  and an 81 mg aspirin daily. He has nausea medication available at home.  He reports new bleeding gums while brushing his teeth. He has dental implants and had to cancel a recent dental appointment.     All other systems were reviewed with the patient and are negative.  MEDICAL HISTORY:  Past  Medical History:  Diagnosis Date   Anxiety    Diabetes mellitus without complication (HCC)     SURGICAL HISTORY: No past surgical history on file.  I have reviewed the social history and family history with the patient and they are unchanged from previous note.  ALLERGIES:  has no known allergies.  MEDICATIONS:  Current Outpatient Medications  Medication Sig Dispense Refill   acyclovir  (ZOVIRAX ) 400 MG tablet Take 1 tablet (400 mg total) by mouth 2 (two) times daily. 60 tablet 11   acyclovir  (ZOVIRAX ) 400 MG tablet Take 1 tablet (400 mg total) by mouth 2 (two) times daily. 60 tablet 11   aspirin EC 81 MG tablet Take 81 mg by mouth daily. Swallow whole.     carvedilol  (COREG ) 6.25 MG tablet Take 6.25 mg by mouth 2 (two) times daily with a meal.     diazepam  (VALIUM ) 2 MG tablet Take 2 mg by mouth in the morning and at bedtime.     esomeprazole (NEXIUM) 40 MG capsule Take 40 mg by mouth daily before breakfast.     famotidine  (PEPCID ) 20 MG tablet Take 20 mg by mouth at bedtime.     FLONASE ALLERGY RELIEF 50 MCG/ACT nasal spray Place 1-2 sprays into both nostrils in the morning and at bedtime.     JARDIANCE 10 MG TABS tablet Take 10 mg by mouth daily.     lenalidomide  (REVLIMID ) 25 MG capsule Take 1 capsule (25 mg total) by mouth daily. For 21 days and off for 7 days for a 28 day cycle.  [employer] Auth # [id number]     Date Obtained 04/15/2024 21 capsule 0   [Paused] losartan (COZAAR) 100 MG tablet Take 50  mg by mouth daily.     Multiple Vitamin (MULTIVITAMIN) tablet Take 1 tablet by mouth daily with breakfast.     ondansetron  (ZOFRAN ) 8 MG tablet Take 1 tablet (8 mg total) by mouth every 8 (eight) hours as needed for nausea or vomiting. 30 tablet 1   ondansetron  (ZOFRAN ) 8 MG tablet Take 1 tablet (8 mg total) by mouth every 8 (eight) hours as needed for nausea or vomiting. 30 tablet 1   ONETOUCH VERIO test strip      oxyCODONE  (OXY IR/ROXICODONE ) 5 MG immediate release tablet Take 1 tablet  (5 mg total) by mouth every 8 (eight) hours as needed for severe pain (pain score 7-10). 60 tablet 0   rosuvastatin  (CRESTOR ) 10 MG tablet Take 10 mg by mouth daily.     sildenafil (VIAGRA) 100 MG tablet Take 100 mg by mouth daily as needed (for E.D.).     traMADol  (ULTRAM ) 50 MG tablet Take 1 tablet (50 mg total) by mouth every 6 (six) hours as needed. 30 tablet 0   No current facility-administered medications for this visit.    PHYSICAL EXAMINATION: ECOG PERFORMANCE STATUS: 2 - Symptomatic, <50% confined to bed  There were no vitals filed for this visit. Wt Readings from Last 3 Encounters:  04/23/24 157 lb (71.2 kg)  04/11/24 161 lb 12.8 oz (73.4 kg)  04/09/24 164 lb 9.6 oz (74.7 kg)     GENERAL:alert, no distress and comfortable SKIN: skin color, texture, turgor are normal, no rashes or significant lesions EYES: normal, Conjunctiva are pink and non-injected, sclera clear NECK: supple, thyroid  normal size, non-tender, without nodularity LYMPH:  no palpable lymphadenopathy in the cervical, axillary  LUNGS: clear to auscultation and percussion with normal breathing effort HEART: regular rate & rhythm and no murmurs and no lower extremity edema ABDOMEN:abdomen soft, non-tender and normal bowel sounds Musculoskeletal:no cyanosis of digits and no clubbing  NEURO: alert & oriented x 3 with fluent speech, no focal motor/sensory deficits  Physical Exam    LABORATORY DATA:  I have reviewed the data as listed    Latest Ref Rng & Units 04/23/2024    8:02 AM 04/14/2024    3:10 PM 04/09/2024    4:06 PM  CBC  WBC 4.0 - 10.5 K/uL 7.2  8.2  8.9   Hemoglobin 13.0 - 17.0 g/dL 86.5  86.5  86.6   Hematocrit 39.0 - 52.0 % 39.7  39.4  39.2   Platelets 150 - 400 K/uL 236  368  422         Latest Ref Rng & Units 04/14/2024    3:10 PM 04/09/2024    4:06 PM 03/31/2024    4:51 AM  CMP  Glucose 70 - 99 mg/dL 879  899  97   BUN 8 - 23 mg/dL 13  13  22    Creatinine 0.61 - 1.24 mg/dL 8.98  9.05   9.02   Sodium 135 - 145 mmol/L 141  141  143   Potassium 3.5 - 5.1 mmol/L 4.8  4.6  3.2   Chloride 98 - 111 mmol/L 105  105  107   CO2 22 - 32 mmol/L 32  31  27   Calcium  8.9 - 10.3 mg/dL 9.4  9.3  9.6   Total Protein 6.5 - 8.1 g/dL 7.9  7.9    Total Bilirubin 0.0 - 1.2 mg/dL 0.4  0.5    Alkaline Phos 38 - 126 U/L 196  193    AST 15 -  41 U/L 25  21    ALT 0 - 44 U/L 14  15        RADIOGRAPHIC STUDIES: I have personally reviewed the radiological images as listed and agreed with the findings in the report. No results found.    No orders of the defined types were placed in this encounter.  All questions were answered. The patient knows to call the clinic with any problems, questions or concerns. No barriers to learning was detected. The total time spent in the appointment was 40 minutes, including review of chart and various tests results, discussions about plan of care and coordination of care plan     Onita Mattock, MD 04/23/2024

## 2024-04-23 NOTE — Telephone Encounter (Signed)
 As per Dr. Lanny gave patients daughter a dental Clearance form for Zometa , instructed her to give this form to the dentist to be filled out and faxed back to us . She voiced full understanding and had no further questions at this time.

## 2024-04-23 NOTE — Progress Notes (Signed)
 Sent email to [employer] in [place] Pathology Team to add a MM Fish to pt's bone biopsy cases:[id number] or to case: [id number].  Dr. Lanny CC to email request to [facility].  Awaiting Tammy's response to email request.

## 2024-04-23 NOTE — Patient Instructions (Signed)

## 2024-04-24 ENCOUNTER — Other Ambulatory Visit: Payer: Self-pay

## 2024-04-24 ENCOUNTER — Ambulatory Visit
Admission: RE | Admit: 2024-04-24 | Discharge: 2024-04-24 | Discharge status: Home / Self Care | Source: Ambulatory Visit | Attending: Radiation Oncology

## 2024-04-24 DIAGNOSIS — C9000 Multiple myeloma not having achieved remission: Secondary | ICD-10-CM | POA: Diagnosis not present

## 2024-04-24 DIAGNOSIS — Z5112 Encounter for antineoplastic immunotherapy: Secondary | ICD-10-CM | POA: Diagnosis not present

## 2024-04-24 DIAGNOSIS — E1122 Type 2 diabetes mellitus with diabetic chronic kidney disease: Secondary | ICD-10-CM | POA: Diagnosis not present

## 2024-04-24 DIAGNOSIS — N189 Chronic kidney disease, unspecified: Secondary | ICD-10-CM | POA: Diagnosis not present

## 2024-04-24 DIAGNOSIS — Z510 Encounter for antineoplastic radiation therapy: Secondary | ICD-10-CM | POA: Diagnosis not present

## 2024-04-24 LAB — RAD ONC ARIA SESSION SUMMARY
Course Elapsed Days: 2
Plan Fractions Treated to Date: 3
Plan Fractions Treated to Date: 3
Plan Prescribed Dose Per Fraction: 2.5 Gy
Plan Prescribed Dose Per Fraction: 2.5 Gy
Plan Total Fractions Prescribed: 10
Plan Total Fractions Prescribed: 10
Plan Total Prescribed Dose: 25 Gy
Plan Total Prescribed Dose: 25 Gy
Reference Point Dosage Given to Date: 7.5 Gy
Reference Point Dosage Given to Date: 7.5 Gy
Reference Point Session Dosage Given: 2.5 Gy
Reference Point Session Dosage Given: 2.5 Gy
Session Number: 3

## 2024-04-25 ENCOUNTER — Ambulatory Visit
Admission: RE | Admit: 2024-04-25 | Discharge: 2024-04-25 | Disposition: A | Discharge status: Home / Self Care | Source: Ambulatory Visit | Attending: Radiation Oncology | Admitting: Radiation Oncology

## 2024-04-25 ENCOUNTER — Other Ambulatory Visit: Payer: Self-pay

## 2024-04-25 DIAGNOSIS — Z5112 Encounter for antineoplastic immunotherapy: Secondary | ICD-10-CM | POA: Diagnosis not present

## 2024-04-25 DIAGNOSIS — C9000 Multiple myeloma not having achieved remission: Secondary | ICD-10-CM | POA: Diagnosis not present

## 2024-04-25 DIAGNOSIS — N189 Chronic kidney disease, unspecified: Secondary | ICD-10-CM | POA: Diagnosis not present

## 2024-04-25 DIAGNOSIS — Z510 Encounter for antineoplastic radiation therapy: Secondary | ICD-10-CM | POA: Diagnosis not present

## 2024-04-25 DIAGNOSIS — E1122 Type 2 diabetes mellitus with diabetic chronic kidney disease: Secondary | ICD-10-CM | POA: Diagnosis not present

## 2024-04-25 LAB — RAD ONC ARIA SESSION SUMMARY

## 2024-04-25 LAB — PRETREATMENT RBC PHENOTYPE

## 2024-04-29 ENCOUNTER — Other Ambulatory Visit: Payer: Self-pay

## 2024-04-29 ENCOUNTER — Other Ambulatory Visit

## 2024-04-29 ENCOUNTER — Ambulatory Visit
Admission: RE | Admit: 2024-04-29 | Discharge: 2024-04-29 | Disposition: A | Discharge status: Home / Self Care | Source: Ambulatory Visit | Attending: Radiation Oncology | Admitting: Radiation Oncology

## 2024-04-29 ENCOUNTER — Ambulatory Visit: Admitting: Hematology

## 2024-04-29 DIAGNOSIS — Z510 Encounter for antineoplastic radiation therapy: Secondary | ICD-10-CM | POA: Insufficient documentation

## 2024-04-29 DIAGNOSIS — C9000 Multiple myeloma not having achieved remission: Secondary | ICD-10-CM | POA: Insufficient documentation

## 2024-04-29 LAB — RAD ONC ARIA SESSION SUMMARY
Course Elapsed Days: 7
Plan Fractions Treated to Date: 5
Plan Fractions Treated to Date: 5
Plan Prescribed Dose Per Fraction: 2.5 Gy
Plan Prescribed Dose Per Fraction: 2.5 Gy
Plan Total Fractions Prescribed: 10
Plan Total Fractions Prescribed: 10
Plan Total Prescribed Dose: 25 Gy
Plan Total Prescribed Dose: 25 Gy
Reference Point Dosage Given to Date: 12.5 Gy
Reference Point Dosage Given to Date: 12.5 Gy
Reference Point Session Dosage Given: 2.5 Gy
Reference Point Session Dosage Given: 2.5 Gy
Session Number: 5

## 2024-04-30 ENCOUNTER — Inpatient Hospital Stay

## 2024-04-30 ENCOUNTER — Encounter (HOSPITAL_COMMUNITY): Payer: Self-pay | Admitting: Hematology

## 2024-04-30 ENCOUNTER — Ambulatory Visit
Admission: RE | Admit: 2024-04-30 | Discharge: 2024-04-30 | Disposition: A | Discharge status: Home / Self Care | Source: Ambulatory Visit | Attending: Radiation Oncology | Admitting: Radiation Oncology

## 2024-04-30 ENCOUNTER — Other Ambulatory Visit: Payer: Self-pay

## 2024-04-30 VITALS — BP 122/56 | HR 55 | Temp 97.8°F | Resp 16 | Wt 165.5 lb

## 2024-04-30 DIAGNOSIS — Z79620 Long term (current) use of immunosuppressive biologic: Secondary | ICD-10-CM | POA: Insufficient documentation

## 2024-04-30 DIAGNOSIS — Z5112 Encounter for antineoplastic immunotherapy: Secondary | ICD-10-CM | POA: Insufficient documentation

## 2024-04-30 DIAGNOSIS — C9002 Multiple myeloma in relapse: Secondary | ICD-10-CM | POA: Insufficient documentation

## 2024-04-30 DIAGNOSIS — C9000 Multiple myeloma not having achieved remission: Secondary | ICD-10-CM

## 2024-04-30 DIAGNOSIS — Z510 Encounter for antineoplastic radiation therapy: Secondary | ICD-10-CM | POA: Diagnosis not present

## 2024-04-30 LAB — COMPREHENSIVE METABOLIC PANEL WITH GFR
ALT: 15 U/L (ref 0–44)
AST: 16 U/L (ref 15–41)
Albumin: 3.4 g/dL — ABNORMAL LOW (ref 3.5–5.0)
Alkaline Phosphatase: 152 U/L — ABNORMAL HIGH (ref 38–126)
Anion gap: 5 (ref 5–15)
BUN: 12 mg/dL (ref 8–23)
CO2: 28 mmol/L (ref 22–32)
Calcium: 8.2 mg/dL — ABNORMAL LOW (ref 8.9–10.3)
Chloride: 106 mmol/L (ref 98–111)
Creatinine, Ser: 0.55 mg/dL — ABNORMAL LOW (ref 0.61–1.24)
GFR, Estimated: >60 mL/min (ref >60)
Glucose, Bld: 117 mg/dL — ABNORMAL HIGH (ref 70–99)
Potassium: 4.2 mmol/L (ref 3.5–5.1)
Sodium: 139 mmol/L (ref 135–145)
Total Bilirubin: 0.6 mg/dL (ref 0.0–1.2)
Total Protein: 6.6 g/dL (ref 6.5–8.1)

## 2024-04-30 LAB — RAD ONC ARIA SESSION SUMMARY
Course Elapsed Days: 8
Plan Fractions Treated to Date: 6
Plan Fractions Treated to Date: 6
Plan Prescribed Dose Per Fraction: 2.5 Gy
Plan Prescribed Dose Per Fraction: 2.5 Gy
Plan Total Fractions Prescribed: 10
Plan Total Fractions Prescribed: 10
Plan Total Prescribed Dose: 25 Gy
Plan Total Prescribed Dose: 25 Gy
Reference Point Dosage Given to Date: 15 Gy
Reference Point Dosage Given to Date: 15 Gy
Reference Point Session Dosage Given: 2.5 Gy
Reference Point Session Dosage Given: 2.5 Gy
Session Number: 6

## 2024-04-30 LAB — CBC WITH DIFFERENTIAL (CANCER CENTER ONLY)
Abs Immature Granulocytes: 0.02 K/uL (ref 0.00–0.07)
Basophils Absolute: 0 K/uL (ref 0.0–0.1)
Basophils Relative: 0 %
Eosinophils Absolute: 0.4 K/uL (ref 0.0–0.5)
Eosinophils Relative: 8 %
HCT: 39.7 % (ref 39.0–52.0)
Hemoglobin: 13.4 g/dL (ref 13.0–17.0)
Immature Granulocytes: 0 %
Lymphocytes Relative: 23 %
Lymphs Abs: 1.1 K/uL (ref 0.7–4.0)
MCH: 33.3 pg (ref 26.0–34.0)
MCHC: 33.8 g/dL (ref 30.0–36.0)
MCV: 98.8 fL (ref 80.0–100.0)
Monocytes Absolute: 0.2 K/uL (ref 0.1–1.0)
Monocytes Relative: 5 %
Neutro Abs: 2.9 K/uL (ref 1.7–7.7)
Neutrophils Relative %: 64 %
Platelet Count: 189 K/uL (ref 150–400)
RBC: 4.02 MIL/uL — ABNORMAL LOW (ref 4.22–5.81)
RDW: 13.3 % (ref 11.5–15.5)
WBC Count: 4.6 K/uL (ref 4.0–10.5)
nRBC: 0 % (ref 0.0–0.2)

## 2024-04-30 MED ORDER — DARATUMUMAB-HYALURONIDASE-FIHJ 1800-30000 MG-UT/15ML ~~LOC~~ SOLN
1800.0000 mg | Freq: Once | SUBCUTANEOUS | Status: AC
  Administered 2024-04-30: 1800 mg via SUBCUTANEOUS
  Filled 2024-04-30: qty 15

## 2024-04-30 MED ORDER — MONTELUKAST SODIUM 10 MG PO TABS
10.0000 mg | ORAL_TABLET | Freq: Once | ORAL | Status: AC
  Administered 2024-04-30: 10 mg via ORAL
  Filled 2024-04-30: qty 1

## 2024-04-30 MED ORDER — DEXAMETHASONE 4 MG PO TABS
20.0000 mg | ORAL_TABLET | Freq: Once | ORAL | Status: AC
  Administered 2024-04-30: 20 mg via ORAL
  Filled 2024-04-30: qty 5

## 2024-04-30 MED ORDER — DIPHENHYDRAMINE HCL 25 MG PO CAPS
25.0000 mg | ORAL_CAPSULE | Freq: Once | ORAL | Status: AC
  Administered 2024-04-30: 25 mg via ORAL
  Filled 2024-04-30: qty 1

## 2024-04-30 MED ORDER — ACETAMINOPHEN 325 MG PO TABS
650.0000 mg | ORAL_TABLET | Freq: Once | ORAL | Status: AC
  Administered 2024-04-30: 650 mg via ORAL
  Filled 2024-04-30: qty 2

## 2024-04-30 NOTE — Progress Notes (Signed)
 Patient tolerated his darzalex  really well- no s/s of any reaction. Observation 1 hour post second injection- VSS.  BP (!) 122/56 (BP Location: Left Arm, Patient Position: Sitting)   Pulse (!) 55   Temp 97.8 F (36.6 C) (Oral)   Resp 16   Wt 165 lb 8 oz (75.1 kg)   SpO2 100%   BMI 25.92 kg/m   Ambulatory to the lobby with family.

## 2024-04-30 NOTE — Patient Instructions (Signed)
 [facility] - A DEPT OF [facility]  Discharge Instructions: Thank you for choosing [facility] to provide your oncology and hematology care.   If you have a lab appointment with the Cancer Center, please go directly to the Cancer Center and check in at the registration area.   Wear comfortable clothing and clothing appropriate for easy access to any Portacath or PICC line.   We strive to give you quality time with your provider. You may need to reschedule your appointment if you arrive late (15 or more minutes).  Arriving late affects you and other patients whose appointments are after yours.  Also, if you miss three or more appointments without notifying the office, you may be dismissed from the clinic at the provider's discretion.      For prescription refill requests, have your pharmacy contact our office and allow 72 hours for refills to be completed.    Today you received the following chemotherapy and/or immunotherapy agents Darzalex faspro      To help prevent nausea and vomiting after your treatment, we encourage you to take your nausea medication as directed.  BELOW ARE SYMPTOMS THAT SHOULD BE REPORTED IMMEDIATELY: *FEVER GREATER THAN 100.4 F (38 C) OR HIGHER *CHILLS OR SWEATING *NAUSEA AND VOMITING THAT IS NOT CONTROLLED WITH YOUR NAUSEA MEDICATION *UNUSUAL SHORTNESS OF BREATH *UNUSUAL BRUISING OR BLEEDING *URINARY PROBLEMS (pain or burning when urinating, or frequent urination) *BOWEL PROBLEMS (unusual diarrhea, constipation, pain near the anus) TENDERNESS IN MOUTH AND THROAT WITH OR WITHOUT PRESENCE OF ULCERS (sore throat, sores in mouth, or a toothache) UNUSUAL RASH, SWELLING OR PAIN  UNUSUAL VAGINAL DISCHARGE OR ITCHING   Items with * indicate a potential emergency and should be followed up as soon as possible or go to the Emergency Department if any problems should occur.  Please show the CHEMOTHERAPY ALERT CARD or  IMMUNOTHERAPY ALERT CARD at check-in to the Emergency Department and triage nurse.  Should you have questions after your visit or need to cancel or reschedule your appointment, please contact [facility] - A DEPT OF [facility]  Dept: [phone]  and follow the prompts.  Office hours are 8:00 a.m. to 4:30 p.m. Monday - Friday. Please note that voicemails left after 4:00 p.m. may not be returned until the following business day.  We are closed weekends and major holidays. You have access to a nurse at all times for urgent questions. Please call the main number to the clinic Dept: [phone] and follow the prompts.   For any non-urgent questions, you may also contact your provider using MyChart. We now offer e-Visits for anyone 40 and older to request care online for non-urgent symptoms. For details visit [email or web address].   Also download the MyChart app! Go to the app store, search "MyChart", open the app, select [place], and log in with your MyChart username and password.

## 2024-05-01 ENCOUNTER — Ambulatory Visit
Admission: RE | Admit: 2024-05-01 | Discharge: 2024-05-01 | Disposition: A | Discharge status: Home / Self Care | Source: Ambulatory Visit | Attending: Radiation Oncology | Admitting: Radiation Oncology

## 2024-05-01 ENCOUNTER — Other Ambulatory Visit: Payer: Self-pay

## 2024-05-01 DIAGNOSIS — C9000 Multiple myeloma not having achieved remission: Secondary | ICD-10-CM | POA: Diagnosis not present

## 2024-05-01 DIAGNOSIS — Z510 Encounter for antineoplastic radiation therapy: Secondary | ICD-10-CM | POA: Diagnosis not present

## 2024-05-01 LAB — RAD ONC ARIA SESSION SUMMARY
Course Elapsed Days: 9
Plan Fractions Treated to Date: 7
Plan Fractions Treated to Date: 7
Plan Prescribed Dose Per Fraction: 2.5 Gy
Plan Prescribed Dose Per Fraction: 2.5 Gy
Plan Total Fractions Prescribed: 10
Plan Total Fractions Prescribed: 10
Plan Total Prescribed Dose: 25 Gy
Plan Total Prescribed Dose: 25 Gy
Reference Point Dosage Given to Date: 17.5 Gy
Reference Point Dosage Given to Date: 17.5 Gy
Reference Point Session Dosage Given: 2.5 Gy
Reference Point Session Dosage Given: 2.5 Gy
Session Number: 7

## 2024-05-02 ENCOUNTER — Other Ambulatory Visit: Payer: Self-pay

## 2024-05-02 ENCOUNTER — Ambulatory Visit
Admission: RE | Admit: 2024-05-02 | Discharge: 2024-05-02 | Disposition: A | Discharge status: Home / Self Care | Source: Ambulatory Visit | Attending: Radiation Oncology | Admitting: Radiation Oncology

## 2024-05-02 ENCOUNTER — Telehealth: Payer: Self-pay | Admitting: Nurse Practitioner

## 2024-05-02 DIAGNOSIS — C9000 Multiple myeloma not having achieved remission: Secondary | ICD-10-CM | POA: Diagnosis not present

## 2024-05-02 DIAGNOSIS — Z510 Encounter for antineoplastic radiation therapy: Secondary | ICD-10-CM | POA: Diagnosis not present

## 2024-05-02 LAB — RAD ONC ARIA SESSION SUMMARY
Course Elapsed Days: 10
Plan Fractions Treated to Date: 8
Plan Fractions Treated to Date: 8
Plan Prescribed Dose Per Fraction: 2.5 Gy
Plan Prescribed Dose Per Fraction: 2.5 Gy
Plan Total Fractions Prescribed: 10
Plan Total Fractions Prescribed: 10
Plan Total Prescribed Dose: 25 Gy
Plan Total Prescribed Dose: 25 Gy
Reference Point Dosage Given to Date: 20 Gy
Reference Point Dosage Given to Date: 20 Gy
Reference Point Session Dosage Given: 2.5 Gy
Reference Point Session Dosage Given: 2.5 Gy
Session Number: 8

## 2024-05-02 NOTE — Telephone Encounter (Signed)
 Scheduled appointments per referral. Talked with the patient and he is aware of the appointment time and date as well as the address. Patient was informed to arrive 10-15 minutes prior with updated insurance information. Patient was also informed that two guests are permitted but needs to be at least 75 years of age. All questions were answered.

## 2024-05-05 ENCOUNTER — Ambulatory Visit
Admission: RE | Admit: 2024-05-05 | Discharge: 2024-05-05 | Disposition: A | Discharge status: Home / Self Care | Source: Ambulatory Visit | Attending: Radiation Oncology | Admitting: Radiation Oncology

## 2024-05-05 ENCOUNTER — Other Ambulatory Visit: Payer: Self-pay

## 2024-05-05 DIAGNOSIS — C9000 Multiple myeloma not having achieved remission: Secondary | ICD-10-CM | POA: Diagnosis not present

## 2024-05-05 DIAGNOSIS — Z510 Encounter for antineoplastic radiation therapy: Secondary | ICD-10-CM | POA: Diagnosis not present

## 2024-05-05 LAB — RAD ONC ARIA SESSION SUMMARY
Course Elapsed Days: 13
Plan Fractions Treated to Date: 9
Plan Fractions Treated to Date: 9
Plan Prescribed Dose Per Fraction: 2.5 Gy
Plan Prescribed Dose Per Fraction: 2.5 Gy
Plan Total Fractions Prescribed: 10
Plan Total Fractions Prescribed: 10
Plan Total Prescribed Dose: 25 Gy
Plan Total Prescribed Dose: 25 Gy
Reference Point Dosage Given to Date: 22.5 Gy
Reference Point Dosage Given to Date: 22.5 Gy
Reference Point Session Dosage Given: 2.5 Gy
Reference Point Session Dosage Given: 2.5 Gy
Session Number: 9

## 2024-05-06 ENCOUNTER — Ambulatory Visit
Admission: RE | Admit: 2024-05-06 | Discharge: 2024-05-06 | Disposition: A | Discharge status: Home / Self Care | Source: Ambulatory Visit | Attending: Radiation Oncology

## 2024-05-06 ENCOUNTER — Inpatient Hospital Stay (HOSPITAL_BASED_OUTPATIENT_CLINIC_OR_DEPARTMENT_OTHER): Admitting: Hematology

## 2024-05-06 ENCOUNTER — Other Ambulatory Visit: Payer: Self-pay

## 2024-05-06 ENCOUNTER — Inpatient Hospital Stay

## 2024-05-06 DIAGNOSIS — Z510 Encounter for antineoplastic radiation therapy: Secondary | ICD-10-CM | POA: Diagnosis not present

## 2024-05-06 DIAGNOSIS — C9000 Multiple myeloma not having achieved remission: Secondary | ICD-10-CM

## 2024-05-06 LAB — RAD ONC ARIA SESSION SUMMARY
Course Elapsed Days: 14
Plan Fractions Treated to Date: 10
Plan Fractions Treated to Date: 10
Plan Prescribed Dose Per Fraction: 2.5 Gy
Plan Prescribed Dose Per Fraction: 2.5 Gy
Plan Total Fractions Prescribed: 10
Plan Total Fractions Prescribed: 10
Plan Total Prescribed Dose: 25 Gy
Plan Total Prescribed Dose: 25 Gy
Reference Point Dosage Given to Date: 25 Gy
Reference Point Dosage Given to Date: 25 Gy
Reference Point Session Dosage Given: 2.5 Gy
Reference Point Session Dosage Given: 2.5 Gy
Session Number: 10

## 2024-05-06 LAB — CBC WITH DIFFERENTIAL/PLATELET
Abs Immature Granulocytes: 0.01 K/uL (ref 0.00–0.07)
Basophils Absolute: 0 K/uL (ref 0.0–0.1)
Basophils Relative: 0 %
Eosinophils Absolute: 0.3 K/uL (ref 0.0–0.5)
Eosinophils Relative: 7 %
HCT: 40.4 % (ref 39.0–52.0)
Hemoglobin: 13.9 g/dL (ref 13.0–17.0)
Immature Granulocytes: 0 %
Lymphocytes Relative: 16 %
Lymphs Abs: 0.7 K/uL (ref 0.7–4.0)
MCH: 33.2 pg (ref 26.0–34.0)
MCHC: 34.4 g/dL (ref 30.0–36.0)
MCV: 96.4 fL (ref 80.0–100.0)
Monocytes Absolute: 0.4 K/uL (ref 0.1–1.0)
Monocytes Relative: 9 %
Neutro Abs: 2.8 K/uL (ref 1.7–7.7)
Neutrophils Relative %: 68 %
Platelets: 174 K/uL (ref 150–400)
RBC: 4.19 MIL/uL — ABNORMAL LOW (ref 4.22–5.81)
RDW: 13.5 % (ref 11.5–15.5)
WBC: 4.2 K/uL (ref 4.0–10.5)
nRBC: 0 % (ref 0.0–0.2)

## 2024-05-06 LAB — COMPREHENSIVE METABOLIC PANEL WITH GFR
ALT: 28 U/L (ref 0–44)
AST: 34 U/L (ref 15–41)
Albumin: 3.8 g/dL (ref 3.5–5.0)
Alkaline Phosphatase: 208 U/L — ABNORMAL HIGH (ref 38–126)
Anion gap: 5 (ref 5–15)
BUN: 12 mg/dL (ref 8–23)
CO2: 30 mmol/L (ref 22–32)
Calcium: 8.1 mg/dL — ABNORMAL LOW (ref 8.9–10.3)
Chloride: 108 mmol/L (ref 98–111)
Creatinine, Ser: 0.64 mg/dL (ref 0.61–1.24)
GFR, Estimated: >60 mL/min (ref >60)
Glucose, Bld: 104 mg/dL — ABNORMAL HIGH (ref 70–99)
Potassium: 4 mmol/L (ref 3.5–5.1)
Sodium: 143 mmol/L (ref 135–145)
Total Bilirubin: 0.7 mg/dL (ref 0.0–1.2)
Total Protein: 6.4 g/dL — ABNORMAL LOW (ref 6.5–8.1)

## 2024-05-06 NOTE — Assessment & Plan Note (Signed)
-  IgG kappa type, stage I - Patient with admitted to the hospital on March 29, 2023 for hypercalcemia and diffuse lytic bone lesions on CT and MRI. -Bone marrow biopsy showed plasmacytoma.  Due to the sample limitation, the accurate percentage of plasma cell cannot be determined -Multiple myeloma showed IgG kappa monoclonal protein 1.1g/dl, elevated serum IgG and kappa light chain level with ratio 4.2.  Beta-2 microglobulin 3.0, LDH normal. -Based on the above lab results, diffuse bone lesions, hypercalcemia, mild renal insufficiency and the bone marrow biopsy, patient meets the criteria for multiple myeloma -I recommended induction chemotherapy Dara, Revlimid  and dexa, he started on 04/23/2024

## 2024-05-06 NOTE — Progress Notes (Signed)
 Joe [name]   [phone] Fax:([phone]   Clinic Follow up Note   Patient Care Team: Joe [name], Joe [name] as PCP - General (Family Medicine)  Date of Service:  05/06/2024  CHIEF COMPLAINT: f/u of multiple myeloma  CURRENT THERAPY:  Daratumumab , Revlimid  and dexamethasone   Oncology History   Hypercalcemia of malignancy - Secondary to multiple myeloma -Patient received IV fluids, Zometa  and calcitonin, resolved after medical treatment.  Multiple myeloma (HCC) -IgG kappa type, stage I - Patient with admitted to the hospital on March 29, 2023 for hypercalcemia and diffuse lytic bone lesions on CT and MRI. -Bone marrow biopsy showed plasmacytoma.  Due to the sample limitation, the accurate percentage of plasma cell cannot be determined -Multiple myeloma showed IgG kappa monoclonal protein 1.1g/dl, elevated serum IgG and kappa light chain level with ratio 4.2.  Beta-2 microglobulin 3.0, LDH normal. -Based on the above lab results, diffuse bone lesions, hypercalcemia, mild renal insufficiency and the bone marrow biopsy, patient meets the criteria for multiple myeloma -I recommended induction chemotherapy Dara, Revlimid  and dexa, he started on 04/23/2024  Assessment & Plan Multiple myeloma not in remission Multiple myeloma not in remission. Currently undergoing treatment with daratumumab  injections. Blood counts are good, and FISH panel shows standard risk with no mutations detected. Bone marrow transplant was discussed but deemed unsuitable due to age and current condition. Maintenance therapy with Revlimid  is planned. - Continue daratumumab  injections once a week for the first two months, then every two weeks - Monitor blood counts and light chain levels - Plan for PET scan in 6-12 months - Continue maintenance therapy with Revlimid   Pain due to multiple myeloma (chest, back, mandible, tailbone) Pain in chest, back, mandible, and tailbone due to multiple  myeloma. Chest pain exacerbated by deep breaths and coughing. Mandible pain affecting ability to eat, possibly due to dental issues or lesions from multiple myeloma. Tailbone pain persistent, with risk of pressure sores. - Refer to dentist to evaluate mandible pain and rule out dental abscess - Continue oxycodone  for severe pain and Tylenol  for mild pain - Use cushions and stand up frequently to relieve tailbone pressure - Continue omeprazole and Pepcid  for acid reflux management  Resolved hypercalcemia Hypercalcemia previously resolved. Current calcium  levels are low. Zemaira treatment can lower calcium  levels. - Start calcium  supplementation at 500 mg daily - Start vitamin D  supplementation at 1000 IU daily  Plan - Lab reviewed, adequate for treatment, will proceed to dexamethasone  and daratumumab  today and continue weekly - He will continue Revlimid  at home, my nurse will refill today - Follow-up in 2 weeks - I encouraged him to call his dentist regarding his right dental/mandibular pain - He is cleared for Zometa  infusion by his dentist, will hold it for now due to his right side dental pain - He will complete palliative radiation today.   SUMMARY OF ONCOLOGIC HISTORY: Oncology History  Multiple myeloma (HCC)  04/08/2024 Initial Diagnosis   Multiple myeloma (HCC)   04/23/2024 - 04/23/2024 Chemotherapy   Patient is on Treatment Plan : MYELOMA NEWLY DIAGNOSED Daratumumab  IV + Lenalidomide  + Dexamethasone  Weekly (DaraRd) q28d     04/23/2024 -  Chemotherapy   Patient is on Treatment Plan : MYELOMA RELAPSED REFRACTORY Daratumumab  SQ + Lenalidomide  + Dexamethasone  (DaraRd) q28d        Discussed the use of AI scribe software for clinical note transcription with the patient, who gave verbal consent to proceed.  History of Present Illness Joe [name] is a  73 year old male with multiple myeloma who presents for follow-up.  He experiences significant chest pain, particularly when  taking a deep breath or coughing, located in the [name] of the chest. He uses oxycodone  as needed, usually less than one pill a day, and occasionally uses Tylenol  for mild pain.  Back pain is present, especially when getting up, down, or bending over, localized to one side.  He is on a regimen of daratumumab  injections, initially weekly for two months, now every two weeks. He takes omeprazole and Pepcid  for acid reflux, and a multivitamin with 200 mg of calcium  daily. He maintains good upper body strength and walks with a cane due to some difficulty with leg strength.  He has difficulty opening his mouth wide enough to take pills, with pain on the bottom right side of the mouth, severe when chewing. This began about a week after his last dental visit.  Persistent tailbone discomfort is managed with a gauze bandage and cushion, though pain persists.     All other systems were reviewed with the patient and are negative.  MEDICAL HISTORY:  Past Medical History:  Diagnosis Date   Anxiety    Diabetes mellitus without complication (HCC)     SURGICAL HISTORY: No past surgical history on file.  I have reviewed the social history and family history with the patient and they are unchanged from previous note.  ALLERGIES:  has no known allergies.  MEDICATIONS:  Current Outpatient Medications  Medication Sig Dispense Refill   acyclovir  (ZOVIRAX ) 400 MG tablet Take 1 tablet (400 mg total) by mouth 2 (two) times daily. 60 tablet 11   acyclovir  (ZOVIRAX ) 400 MG tablet Take 1 tablet (400 mg total) by mouth 2 (two) times daily. 60 tablet 11   aspirin EC 81 MG tablet Take 81 mg by mouth daily. Swallow whole.     carvedilol  (COREG ) 6.25 MG tablet Take 6.25 mg by mouth 2 (two) times daily with a meal.     diazepam  (VALIUM ) 2 MG tablet Take 2 mg by mouth in the morning and at bedtime.     esomeprazole (NEXIUM) 40 MG capsule Take 40 mg by mouth daily before breakfast.     famotidine  (PEPCID ) 20 MG  tablet Take 20 mg by mouth at bedtime.     FLONASE ALLERGY RELIEF 50 MCG/ACT nasal spray Place 1-2 sprays into both nostrils in the morning and at bedtime.     JARDIANCE 10 MG TABS tablet Take 10 mg by mouth daily.     lenalidomide  (REVLIMID ) 25 MG capsule Take 1 capsule (25 mg total) by mouth daily. For 21 days and off for 7 days for a 28 day cycle.  [employer] Auth # [id number]     Date Obtained 04/15/2024 21 capsule 0   [Paused] losartan (COZAAR) 100 MG tablet Take 50 mg by mouth daily.     Multiple Vitamin (MULTIVITAMIN) tablet Take 1 tablet by mouth daily with breakfast.     ondansetron  (ZOFRAN ) 8 MG tablet Take 1 tablet (8 mg total) by mouth every 8 (eight) hours as needed for nausea or vomiting. 30 tablet 1   ondansetron  (ZOFRAN ) 8 MG tablet Take 1 tablet (8 mg total) by mouth every 8 (eight) hours as needed for nausea or vomiting. 30 tablet 1   ONETOUCH VERIO test strip      oxyCODONE  (OXY IR/ROXICODONE ) 5 MG immediate release tablet Take 1 tablet (5 mg total) by mouth every 8 (eight) hours as needed for severe pain (  pain score 7-10). 60 tablet 0   rosuvastatin  (CRESTOR ) 10 MG tablet Take 10 mg by mouth daily.     sildenafil (VIAGRA) 100 MG tablet Take 100 mg by mouth daily as needed (for E.D.).     traMADol  (ULTRAM ) 50 MG tablet Take 1 tablet (50 mg total) by mouth every 6 (six) hours as needed. 30 tablet 0   No current facility-administered medications for this visit.    PHYSICAL EXAMINATION: ECOG PERFORMANCE STATUS: 2 - Symptomatic, <50% confined to bed  Vitals:   05/06/24 1001  BP: 130/70  Pulse: (!) 47  Resp: 16  Temp: 98 F (36.7 C)  SpO2: 98%   Wt Readings from Last 3 Encounters:  05/06/24 159 lb 6.4 oz (72.3 kg)  04/30/24 165 lb 8 oz (75.1 kg)  04/23/24 157 lb (71.2 kg)     GENERAL:alert, no distress and comfortable SKIN: skin color, texture, turgor are normal, no rashes or significant lesions EYES: normal, Conjunctiva are pink and non-injected, sclera clear NECK:  supple, thyroid  normal size, non-tender, without nodularity LYMPH:  no palpable lymphadenopathy in the cervical, axillary  LUNGS: clear to auscultation and percussion with normal breathing effort HEART: regular rate & rhythm and no murmurs and no lower extremity edema ABDOMEN:abdomen soft, non-tender and normal bowel sounds Musculoskeletal:no cyanosis of digits and no clubbing  NEURO: alert & oriented x 3 with fluent speech, no focal motor/sensory deficits  Physical Exam    LABORATORY DATA:  I have reviewed the data as listed    Latest Ref Rng & Units 05/06/2024    9:37 AM 04/30/2024    8:04 AM 04/23/2024    8:02 AM  CBC  WBC 4.0 - 10.5 K/uL 4.2  4.6  7.2   Hemoglobin 13.0 - 17.0 g/dL 86.0  86.5  86.5   Hematocrit 39.0 - 52.0 % 40.4  39.7  39.7   Platelets 150 - 400 K/uL 174  189  236         Latest Ref Rng & Units 05/06/2024    9:37 AM 04/30/2024    8:04 AM 04/14/2024    3:10 PM  CMP  Glucose 70 - 99 mg/dL 895  882  879   BUN 8 - 23 mg/dL 12  12  13    Creatinine 0.61 - 1.24 mg/dL 9.35  9.44  8.98   Sodium 135 - 145 mmol/L 143  139  141   Potassium 3.5 - 5.1 mmol/L 4.0  4.2  4.8   Chloride 98 - 111 mmol/L 108  106  105   CO2 22 - 32 mmol/L 30  28  32   Calcium  8.9 - 10.3 mg/dL 8.1  8.2  9.4   Total Protein 6.5 - 8.1 g/dL 6.4  6.6  7.9   Total Bilirubin 0.0 - 1.2 mg/dL 0.7  0.6  0.4   Alkaline Phos 38 - 126 U/L 208  152  196   AST 15 - 41 U/L 34  16  25   ALT 0 - 44 U/L 28  15  14        RADIOGRAPHIC STUDIES: I have personally reviewed the radiological images as listed and agreed with the findings in the report. No results found.    Orders Placed This Encounter  Procedures   Kappa/lambda light chains    Standing Status:   Future    Expected Date:   07/16/2024    Expiration Date:   07/16/2025   Multiple Myeloma Panel (SPEP&IFE w/QIG)  Standing Status:   Future    Expected Date:   07/16/2024    Expiration Date:   07/16/2025   CBC with Differential (Cancer [name]  Only)    Standing Status:   Future    Expected Date:   07/16/2024    Expiration Date:   07/16/2025   CBC with Differential (Cancer [name] Only)    Standing Status:   Future    Expected Date:   07/30/2024    Expiration Date:   07/30/2025   All questions were answered. The patient knows to call the clinic with any problems, questions or concerns. No barriers to learning was detected. The total time spent in the appointment was 30 minutes, including review of chart and various tests results, discussions about plan of care and coordination of care plan     Onita Mattock, Joe [name] 05/06/2024

## 2024-05-06 NOTE — Assessment & Plan Note (Signed)
-   Secondary to multiple myeloma -Patient received IV fluids, Zometa  and calcitonin, resolved after medical treatment.

## 2024-05-07 ENCOUNTER — Other Ambulatory Visit: Payer: Self-pay

## 2024-05-07 ENCOUNTER — Inpatient Hospital Stay

## 2024-05-07 VITALS — BP 117/59 | HR 43 | Temp 97.7°F | Resp 16

## 2024-05-07 DIAGNOSIS — Z510 Encounter for antineoplastic radiation therapy: Secondary | ICD-10-CM | POA: Diagnosis not present

## 2024-05-07 DIAGNOSIS — C9000 Multiple myeloma not having achieved remission: Secondary | ICD-10-CM | POA: Diagnosis not present

## 2024-05-07 MED ORDER — LENALIDOMIDE 25 MG PO CAPS: 25.0000 mg | ORAL_CAPSULE | Freq: Every day | ORAL | 0 refills | Status: DC

## 2024-05-07 MED ORDER — DIPHENHYDRAMINE HCL 25 MG PO CAPS
25.0000 mg | ORAL_CAPSULE | Freq: Once | ORAL | Status: AC
  Administered 2024-05-07: 25 mg via ORAL
  Filled 2024-05-07: qty 1

## 2024-05-07 MED ORDER — ACETAMINOPHEN 325 MG PO TABS
650.0000 mg | ORAL_TABLET | Freq: Once | ORAL | Status: AC
  Administered 2024-05-07: 650 mg via ORAL
  Filled 2024-05-07: qty 2

## 2024-05-07 MED ORDER — MONTELUKAST SODIUM 10 MG PO TABS
10.0000 mg | ORAL_TABLET | Freq: Once | ORAL | Status: AC
  Administered 2024-05-07: 10 mg via ORAL
  Filled 2024-05-07: qty 1

## 2024-05-07 MED ORDER — DEXAMETHASONE 4 MG PO TABS
20.0000 mg | ORAL_TABLET | Freq: Once | ORAL | Status: AC
  Administered 2024-05-07: 20 mg via ORAL
  Filled 2024-05-07: qty 5

## 2024-05-07 MED ORDER — DARATUMUMAB-HYALURONIDASE-FIHJ 1800-30000 MG-UT/15ML ~~LOC~~ SOLN
1800.0000 mg | Freq: Once | SUBCUTANEOUS | Status: AC
  Administered 2024-05-07: 1800 mg via SUBCUTANEOUS
  Filled 2024-05-07: qty 15

## 2024-05-07 NOTE — Radiation Completion Notes (Addendum)
  Radiation Oncology         [phone] ________________________________  Name: Joe [name] MRN: [id number]  Date of Service: 05/06/2024  DOB: 06-21-1951  End of Treatment Note   Diagnosis: Multiple Myeloma with painful bone disease.   Intent: Palliative     ==========DELIVERED PLANS==========  First Treatment Date: 2024-04-22 Last Treatment Date: 2024-05-06   Plan Name: Spine_Sacrum Site: Sacrum Technique: Isodose Plan Mode: Photon Dose Per Fraction: 2.5 Gy Prescribed Dose (Delivered / Prescribed): 25 Gy / 25 Gy Prescribed Fxs (Delivered / Prescribed): 10 / 10   Plan Name: Chest_R_Rib Site: Ribs, Right Technique: Isodose Plan Mode: Photon Dose Per Fraction: 2.5 Gy Prescribed Dose (Delivered / Prescribed): 25 Gy / 25 Gy Prescribed Fxs (Delivered / Prescribed): 10 / 10     ==========ON TREATMENT VISIT DATES========== 2024-04-25, 2024-05-02    See weekly On Treatment Notes in Epic for details in the Media tab (listed as Progress notes on the On Treatment Visit Dates listed above).  The patient tolerated radiation. He did not have fatigue or skin skin changes in the treatment field. He noted improvement in the pain in the treatment sites by the end of his radiation.  The patient will receive a call in about one month from the radiation oncology department. He will continue follow up with Dr. Lanny as well.      Donald KYM Husband, PAC

## 2024-05-07 NOTE — Patient Instructions (Signed)

## 2024-05-08 ENCOUNTER — Other Ambulatory Visit: Payer: Self-pay

## 2024-05-13 ENCOUNTER — Ambulatory Visit: Admitting: Nurse Practitioner

## 2024-05-13 ENCOUNTER — Other Ambulatory Visit

## 2024-05-14 ENCOUNTER — Other Ambulatory Visit: Payer: Self-pay | Admitting: Nurse Practitioner

## 2024-05-14 ENCOUNTER — Inpatient Hospital Stay

## 2024-05-14 ENCOUNTER — Encounter: Payer: Self-pay | Admitting: *Deleted

## 2024-05-14 DIAGNOSIS — C9000 Multiple myeloma not having achieved remission: Secondary | ICD-10-CM

## 2024-05-14 DIAGNOSIS — K7460 Unspecified cirrhosis of liver: Secondary | ICD-10-CM | POA: Diagnosis not present

## 2024-05-14 DIAGNOSIS — Z510 Encounter for antineoplastic radiation therapy: Secondary | ICD-10-CM | POA: Diagnosis not present

## 2024-05-14 DIAGNOSIS — K7581 Nonalcoholic steatohepatitis (NASH): Secondary | ICD-10-CM | POA: Diagnosis not present

## 2024-05-14 LAB — CBC WITH DIFFERENTIAL (CANCER CENTER ONLY)
Abs Immature Granulocytes: 0.02 K/uL (ref 0.00–0.07)
Basophils Absolute: 0 K/uL (ref 0.0–0.1)
Basophils Relative: 1 %
Eosinophils Absolute: 0.3 K/uL (ref 0.0–0.5)
Eosinophils Relative: 21 %
HCT: 36.8 % — ABNORMAL LOW (ref 39.0–52.0)
Hemoglobin: 12.1 g/dL — ABNORMAL LOW (ref 13.0–17.0)
Immature Granulocytes: 1 %
Lymphocytes Relative: 23 %
Lymphs Abs: 0.3 K/uL — ABNORMAL LOW (ref 0.7–4.0)
MCH: 32.4 pg (ref 26.0–34.0)
MCHC: 32.9 g/dL (ref 30.0–36.0)
MCV: 98.4 fL (ref 80.0–100.0)
Monocytes Absolute: 0.2 K/uL (ref 0.1–1.0)
Monocytes Relative: 13 %
Neutro Abs: 0.6 K/uL — ABNORMAL LOW (ref 1.7–7.7)
Neutrophils Relative %: 41 %
Platelet Count: 132 K/uL — ABNORMAL LOW (ref 150–400)
RBC: 3.74 MIL/uL — ABNORMAL LOW (ref 4.22–5.81)
RDW: 13.7 % (ref 11.5–15.5)
WBC Count: 1.5 K/uL — ABNORMAL LOW (ref 4.0–10.5)
nRBC: 0 % (ref 0.0–0.2)

## 2024-05-14 LAB — COMPREHENSIVE METABOLIC PANEL WITH GFR
ALT: 46 U/L — ABNORMAL HIGH (ref 0–44)
AST: 57 U/L — ABNORMAL HIGH (ref 15–41)
Albumin: 3.3 g/dL — ABNORMAL LOW (ref 3.5–5.0)
Alkaline Phosphatase: 290 U/L — ABNORMAL HIGH (ref 38–126)
Anion gap: 4 — ABNORMAL LOW (ref 5–15)
BUN: 14 mg/dL (ref 8–23)
CO2: 30 mmol/L (ref 22–32)
Calcium: 7.4 mg/dL — ABNORMAL LOW (ref 8.9–10.3)
Chloride: 109 mmol/L (ref 98–111)
Creatinine, Ser: 0.63 mg/dL (ref 0.61–1.24)
GFR, Estimated: >60 mL/min (ref >60)
Glucose, Bld: 117 mg/dL — ABNORMAL HIGH (ref 70–99)
Potassium: 3.9 mmol/L (ref 3.5–5.1)
Sodium: 143 mmol/L (ref 135–145)
Total Bilirubin: 0.6 mg/dL (ref 0.0–1.2)
Total Protein: 5.6 g/dL — ABNORMAL LOW (ref 6.5–8.1)

## 2024-05-14 NOTE — Progress Notes (Signed)
 ANC 0.6, no tx today per Joe [name] Lessen NP.  Patient informed, instructed him to keep next appointment on 05/20/24.  Informed patient he is very susceptible to infection, he needs to avoid crowds, wear a mask, and do frequent hand washing especially if he has been out.  He verbalizes understanding.

## 2024-05-15 ENCOUNTER — Other Ambulatory Visit: Payer: Self-pay

## 2024-05-19 NOTE — Assessment & Plan Note (Signed)
-   Secondary to multiple myeloma -Patient received IV fluids, Zometa  and calcitonin, resolved after medical treatment.

## 2024-05-19 NOTE — Assessment & Plan Note (Signed)
-  IgG kappa type, stage I - Patient with admitted to the hospital on March 29, 2023 for hypercalcemia and diffuse lytic bone lesions on CT and MRI. -Bone marrow biopsy showed plasmacytoma.  Due to the sample limitation, the accurate percentage of plasma cell cannot be determined -Multiple myeloma showed IgG kappa monoclonal protein 1.1g/dl, elevated serum IgG and kappa light chain level with ratio 4.2.  Beta-2 microglobulin 3.0, LDH normal. -Based on the above lab results, diffuse bone lesions, hypercalcemia, mild renal insufficiency and the bone marrow biopsy, patient meets the criteria for multiple myeloma -I recommended induction chemotherapy Dara, Revlimid  and dexa, he started on 04/23/2024

## 2024-05-20 ENCOUNTER — Inpatient Hospital Stay (HOSPITAL_BASED_OUTPATIENT_CLINIC_OR_DEPARTMENT_OTHER): Admitting: Hematology

## 2024-05-20 ENCOUNTER — Inpatient Hospital Stay

## 2024-05-20 DIAGNOSIS — K7581 Nonalcoholic steatohepatitis (NASH): Secondary | ICD-10-CM

## 2024-05-20 DIAGNOSIS — K7460 Unspecified cirrhosis of liver: Secondary | ICD-10-CM | POA: Diagnosis not present

## 2024-05-20 DIAGNOSIS — C9000 Multiple myeloma not having achieved remission: Secondary | ICD-10-CM | POA: Diagnosis not present

## 2024-05-20 DIAGNOSIS — Z510 Encounter for antineoplastic radiation therapy: Secondary | ICD-10-CM | POA: Diagnosis not present

## 2024-05-20 LAB — CBC WITH DIFFERENTIAL (CANCER CENTER ONLY)
Abs Immature Granulocytes: 0.01 K/uL (ref 0.00–0.07)
Basophils Absolute: 0 K/uL (ref 0.0–0.1)
Basophils Relative: 1 %
Eosinophils Absolute: 0.2 K/uL (ref 0.0–0.5)
Eosinophils Relative: 8 %
HCT: 35.2 % — ABNORMAL LOW (ref 39.0–52.0)
Hemoglobin: 11.8 g/dL — ABNORMAL LOW (ref 13.0–17.0)
Immature Granulocytes: 0 %
Lymphocytes Relative: 19 %
Lymphs Abs: 0.5 K/uL — ABNORMAL LOW (ref 0.7–4.0)
MCH: 33 pg (ref 26.0–34.0)
MCHC: 33.5 g/dL (ref 30.0–36.0)
MCV: 98.3 fL (ref 80.0–100.0)
Monocytes Absolute: 0.3 K/uL (ref 0.1–1.0)
Monocytes Relative: 10 %
Neutro Abs: 1.6 K/uL — ABNORMAL LOW (ref 1.7–7.7)
Neutrophils Relative %: 62 %
Platelet Count: 222 K/uL (ref 150–400)
RBC: 3.58 MIL/uL — ABNORMAL LOW (ref 4.22–5.81)
RDW: 13.8 % (ref 11.5–15.5)
WBC Count: 2.6 K/uL — ABNORMAL LOW (ref 4.0–10.5)
nRBC: 0 % (ref 0.0–0.2)

## 2024-05-20 MED ORDER — ACYCLOVIR 400 MG PO TABS: 400.0000 mg | ORAL_TABLET | Freq: Two times a day (BID) | ORAL | 1 refills | Status: AC

## 2024-05-20 MED ORDER — DARATUMUMAB-HYALURONIDASE-FIHJ 1800-30000 MG-UT/15ML ~~LOC~~ SOLN
1800.0000 mg | Freq: Once | SUBCUTANEOUS | Status: AC
  Administered 2024-05-20: 1800 mg via SUBCUTANEOUS
  Filled 2024-05-20: qty 15

## 2024-05-20 MED ORDER — DIPHENHYDRAMINE HCL 25 MG PO CAPS
25.0000 mg | ORAL_CAPSULE | Freq: Once | ORAL | Status: AC
  Administered 2024-05-20: 25 mg via ORAL
  Filled 2024-05-20: qty 1

## 2024-05-20 MED ORDER — DEXAMETHASONE 6 MG PO TABS
20.0000 mg | ORAL_TABLET | Freq: Once | ORAL | Status: AC
  Administered 2024-05-20: 20 mg via ORAL
  Filled 2024-05-20: qty 2

## 2024-05-20 MED ORDER — ACETAMINOPHEN 325 MG PO TABS
650.0000 mg | ORAL_TABLET | Freq: Once | ORAL | Status: AC
  Administered 2024-05-20: 650 mg via ORAL
  Filled 2024-05-20: qty 2

## 2024-05-20 NOTE — Patient Instructions (Signed)
 [facility] - A DEPT OF [facility]  Discharge Instructions: Thank you for choosing [facility] to provide your oncology and hematology care.   If you have a lab appointment with the Cancer Center, please go directly to the Cancer Center and check in at the registration area.   Wear comfortable clothing and clothing appropriate for easy access to any Portacath or PICC line.   We strive to give you quality time with your provider. You may need to reschedule your appointment if you arrive late (15 or more minutes).  Arriving late affects you and other patients whose appointments are after yours.  Also, if you miss three or more appointments without notifying the office, you may be dismissed from the clinic at the provider's discretion.      For prescription refill requests, have your pharmacy contact our office and allow 72 hours for refills to be completed.    Today you received the following chemotherapy and/or immunotherapy agents: daratumumab-hyaluronidase-fihj (DARZALEX FASPRO       To help prevent nausea and vomiting after your treatment, we encourage you to take your nausea medication as directed.  BELOW ARE SYMPTOMS THAT SHOULD BE REPORTED IMMEDIATELY: *FEVER GREATER THAN 100.4 F (38 C) OR HIGHER *CHILLS OR SWEATING *NAUSEA AND VOMITING THAT IS NOT CONTROLLED WITH YOUR NAUSEA MEDICATION *UNUSUAL SHORTNESS OF BREATH *UNUSUAL BRUISING OR BLEEDING *URINARY PROBLEMS (pain or burning when urinating, or frequent urination) *BOWEL PROBLEMS (unusual diarrhea, constipation, pain near the anus) TENDERNESS IN MOUTH AND THROAT WITH OR WITHOUT PRESENCE OF ULCERS (sore throat, sores in mouth, or a toothache) UNUSUAL RASH, SWELLING OR PAIN  UNUSUAL VAGINAL DISCHARGE OR ITCHING   Items with * indicate a potential emergency and should be followed up as soon as possible or go to the Emergency Department if any problems should occur.  Please show the  CHEMOTHERAPY ALERT CARD or IMMUNOTHERAPY ALERT CARD at check-in to the Emergency Department and triage nurse.  Should you have questions after your visit or need to cancel or reschedule your appointment, please contact [facility] - A DEPT OF [facility]  Dept: [phone]  and follow the prompts.  Office hours are 8:00 a.m. to 4:30 p.m. Monday - Friday. Please note that voicemails left after 4:00 p.m. may not be returned until the following business day.  We are closed weekends and major holidays. You have access to a nurse at all times for urgent questions. Please call the main number to the clinic Dept: [phone] and follow the prompts.   For any non-urgent questions, you may also contact your provider using MyChart. We now offer e-Visits for anyone 63 and older to request care online for non-urgent symptoms. For details visit [email or web address].   Also download the MyChart app! Go to the app store, search "MyChart", open the app, select [facility], and log in with your MyChart username and password.

## 2024-05-20 NOTE — Progress Notes (Signed)
 [facility]   Telephone:(336) [phone] Fax:([phone]   Clinic Follow up Note   Patient Care Team: [name], MD as PCP - General (Family Medicine)  Date of Service:  05/20/2024  CHIEF COMPLAINT: f/u of multiple myeloma  CURRENT THERAPY:  First-line chemotherapy Revlimid , dexamethasone  and daratumumab   Oncology History   Hypercalcemia of malignancy - Secondary to multiple myeloma -Patient received IV fluids, Zometa  and calcitonin, resolved after medical treatment.  Multiple myeloma (HCC) -IgG kappa type, stage I - Patient with admitted to the hospital on March 29, 2023 for hypercalcemia and diffuse lytic bone lesions on CT and MRI. -Bone marrow biopsy showed plasmacytoma.  Due to the sample limitation, the accurate percentage of plasma cell cannot be determined -Multiple myeloma showed IgG kappa monoclonal protein 1.1g/dl, elevated serum IgG and kappa light chain level with ratio 4.2.  Beta-2 microglobulin 3.0, LDH normal. -Based on the above lab results, diffuse bone lesions, hypercalcemia, mild renal insufficiency and the bone marrow biopsy, patient meets the criteria for multiple myeloma -I recommended induction chemotherapy Dara, Revlimid  and dexa, he started on 04/23/2024  Assessment & Plan Multiple myeloma not in remission Multiple myeloma is being treated with oral Revlimid  and weekly injections. He reports improvement, indicating a positive treatment response. M protein light chain levels were checked today to assess response, with results pending. He remains optimistic about treatment outcomes, anticipating a high response rate based on typical outcomes. - Continue oral Revlimid  and weekly injections. - After four more weekly treatments, transition to treatment every other week for three months. - Monitor M protein light chain levels to assess treatment response. - Encourage high-calorie, high-protein diet to support energy levels and overall  health.  Leukopenia secondary to chemotherapy Leukopenia was noted with a white blood cell count of 1.5 last week, leading to a temporary pause in treatment. The count has improved to 2.6, allowing for treatment resumption. Leukopenia is attributed to oral Revlimid . - Resume oral Revlimid  as white blood cell count has improved. - Monitor blood counts regularly to ensure he remains at safe levels for continued treatment.  Hypocalcemia Calcium  levels are low, likely due to treatment effects and dietary intake. - Increase calcium  intake to 500-600 mg with each meal, three times a day. - Continue vitamin D  supplementation at 2000 units daily.  Protein-calorie malnutrition, mild Mild protein-calorie malnutrition is present, with low albumin levels noted. This is partially due to the decrease in M protein levels from multiple myeloma treatment. He reports increased appetite and weight gain, with a preference for high-protein foods. - Encourage consumption of high-protein foods such as eggs and meat. - Consider protein powder supplements in smoothies to increase protein intake.  Chronic pain Chronic pain is managed with oxycodone , aiding sleep and comfort. He reports occasional use, particularly after increased physical activity. - Continue oxycodone  as needed for pain management, ensuring to monitor for constipation.   Plan -Patient is clinically doing well, lab reviewed, adequate for treatment, will proceed with dexamethasone  and daratumumab  injection today and continue weekly - He will start Revlimid  tomorrow - Follow-up in 2 weeks     SUMMARY OF ONCOLOGIC HISTORY: Oncology History  Multiple myeloma (HCC)  04/08/2024 Initial Diagnosis   Multiple myeloma (HCC)   04/23/2024 - 04/23/2024 Chemotherapy   Patient is on Treatment Plan : MYELOMA NEWLY DIAGNOSED Daratumumab  IV + Lenalidomide  + Dexamethasone  Weekly (DaraRd) q28d     04/23/2024 -  Chemotherapy   Patient is on Treatment Plan :  MYELOMA RELAPSED REFRACTORY  Daratumumab  SQ + Lenalidomide  + Dexamethasone  (DaraRd) q28d        Discussed the use of AI scribe software for clinical note transcription with the patient, who gave verbal consent to proceed.  History of Present Illness Joe [name] is a 73 year old male with multiple myeloma who presents for follow-up.  His white blood cell count was low at 1.5 last week, preventing treatment, but has improved to 2.6 with a neutrophil count of 1.6. He has been off Revlimid  for a week due to its effect on blood counts and is set to resume it.  He experiences muscle soreness and aches, particularly in the morning, which he manages with 4800 mg of turmeric daily. This regimen significantly alleviates his symptoms.  He has noticed a change in appetite and weight gain, with cravings for peanut butter, Pepsi, and orange juice. He manages his medications through [facility], ensuring a supply of his ACE inhibitor and oxycodone , which aids in sleep and pain management. He reports improved sleep with reduced nocturnal awakenings after adjusting fluid intake. Occasionally, he experiences unsteadiness upon waking, which improves with movement.     All other systems were reviewed with the patient and are negative.  MEDICAL HISTORY:  Past Medical History:  Diagnosis Date   Anxiety    Diabetes mellitus without complication (HCC)     SURGICAL HISTORY: No past surgical history on file.  I have reviewed the social history and family history with the patient and they are unchanged from previous note.  ALLERGIES:  has no known allergies.  MEDICATIONS:  Current Outpatient Medications  Medication Sig Dispense Refill   acyclovir  (ZOVIRAX ) 400 MG tablet Take 1 tablet (400 mg total) by mouth 2 (two) times daily. 60 tablet 11   acyclovir  (ZOVIRAX ) 400 MG tablet Take 1 tablet (400 mg total) by mouth 2 (two) times daily. 180 tablet 1   aspirin EC 81 MG tablet Take 81 mg by mouth  daily. Swallow whole.     carvedilol  (COREG ) 6.25 MG tablet Take 6.25 mg by mouth 2 (two) times daily with a meal.     diazepam  (VALIUM ) 2 MG tablet Take 2 mg by mouth in the morning and at bedtime.     esomeprazole (NEXIUM) 40 MG capsule Take 40 mg by mouth daily before breakfast.     famotidine  (PEPCID ) 20 MG tablet Take 20 mg by mouth at bedtime.     FLONASE ALLERGY RELIEF 50 MCG/ACT nasal spray Place 1-2 sprays into both nostrils in the morning and at bedtime.     JARDIANCE 10 MG TABS tablet Take 10 mg by mouth daily.     lenalidomide  (REVLIMID ) 25 MG capsule Take 1 capsule (25 mg total) by mouth daily. For 21 days and off for 7 days for a 28 day cycle.  [employer] Auth # [id number]    Date Obtained 05/07/2024 21 capsule 0   [Paused] losartan (COZAAR) 100 MG tablet Take 50 mg by mouth daily.     Multiple Vitamin (MULTIVITAMIN) tablet Take 1 tablet by mouth daily with breakfast.     ondansetron  (ZOFRAN ) 8 MG tablet Take 1 tablet (8 mg total) by mouth every 8 (eight) hours as needed for nausea or vomiting. 30 tablet 1   ondansetron  (ZOFRAN ) 8 MG tablet Take 1 tablet (8 mg total) by mouth every 8 (eight) hours as needed for nausea or vomiting. 30 tablet 1   ONETOUCH VERIO test strip      oxyCODONE  (OXY IR/ROXICODONE )  5 MG immediate release tablet Take 1 tablet (5 mg total) by mouth every 8 (eight) hours as needed for severe pain (pain score 7-10). 60 tablet 0   rosuvastatin  (CRESTOR ) 10 MG tablet Take 10 mg by mouth daily.     sildenafil (VIAGRA) 100 MG tablet Take 100 mg by mouth daily as needed (for E.D.).     traMADol  (ULTRAM ) 50 MG tablet Take 1 tablet (50 mg total) by mouth every 6 (six) hours as needed. 30 tablet 0   No current facility-administered medications for this visit.    PHYSICAL EXAMINATION: ECOG PERFORMANCE STATUS: 1 - Symptomatic but completely ambulatory  Vitals:   05/20/24 0830  BP: (!) 112/56  Pulse: (!) 49  Resp: 15  Temp: 98.2 F (36.8 C)  SpO2: 98%   Wt Readings  from Last 3 Encounters:  05/20/24 168 lb 11.2 oz (76.5 kg)  05/06/24 159 lb 6.4 oz (72.3 kg)  04/30/24 165 lb 8 oz (75.1 kg)     GENERAL:alert, no distress and comfortable SKIN: skin color, texture, turgor are normal, no rashes or significant lesions EYES: normal, Conjunctiva are pink and non-injected, sclera clear NECK: supple, thyroid  normal size, non-tender, without nodularity LYMPH:  no palpable lymphadenopathy in the cervical, axillary  LUNGS: clear to auscultation and percussion with normal breathing effort HEART: regular rate & rhythm and no murmurs and no lower extremity edema ABDOMEN:abdomen soft, non-tender and normal bowel sounds Musculoskeletal:no cyanosis of digits and no clubbing  NEURO: alert & oriented x 3 with fluent speech, no focal motor/sensory deficits  Physical Exam    LABORATORY DATA:  I have reviewed the data as listed    Latest Ref Rng & Units 05/20/2024    8:08 AM 05/14/2024    7:33 AM 05/06/2024    9:37 AM  CBC  WBC 4.0 - 10.5 K/uL 2.6  1.5  4.2   Hemoglobin 13.0 - 17.0 g/dL 88.1  87.8  86.0   Hematocrit 39.0 - 52.0 % 35.2  36.8  40.4   Platelets 150 - 400 K/uL 222  132  174         Latest Ref Rng & Units 05/14/2024    7:33 AM 05/06/2024    9:37 AM 04/30/2024    8:04 AM  CMP  Glucose 70 - 99 mg/dL 882  895  882   BUN 8 - 23 mg/dL 14  12  12    Creatinine 0.61 - 1.24 mg/dL 9.36  9.35  9.44   Sodium 135 - 145 mmol/L 143  143  139   Potassium 3.5 - 5.1 mmol/L 3.9  4.0  4.2   Chloride 98 - 111 mmol/L 109  108  106   CO2 22 - 32 mmol/L 30  30  28    Calcium  8.9 - 10.3 mg/dL 7.4  8.1  8.2   Total Protein 6.5 - 8.1 g/dL 5.6  6.4  6.6   Total Bilirubin 0.0 - 1.2 mg/dL 0.6  0.7  0.6   Alkaline Phos 38 - 126 U/L 290  208  152   AST 15 - 41 U/L 57  34  16   ALT 0 - 44 U/L 46  28  15       RADIOGRAPHIC STUDIES: I have personally reviewed the radiological images as listed and agreed with the findings in the report. No results found.    Orders Placed  This Encounter  Procedures   Gamma GT    Labs for Dr. Alyce JONELLE Libman  w/[facility] [phone]    Standing Status:   Future    Expiration Date:   05/20/2025   AFP tumor marker    Labs for Dr. Alyce JONELLE Libman w/[facility] [phone]    Standing Status:   Future    Expiration Date:   05/20/2025   Protime-INR    Labs for Dr. Alyce JONELLE Libman w/[facility] [phone]    Standing Status:   Future    Expiration Date:   05/20/2025   All questions were answered. The patient knows to call the clinic with any problems, questions or concerns. No barriers to learning was detected. The total time spent in the appointment was 25 minutes, including review of chart and various tests results, discussions about plan of care and coordination of care plan     Onita Mattock, MD 05/20/2024

## 2024-05-21 ENCOUNTER — Other Ambulatory Visit: Payer: Self-pay | Admitting: Hematology and Oncology

## 2024-05-21 LAB — KAPPA/LAMBDA LIGHT CHAINS
Kappa free light chain: 9.3 mg/L (ref 3.3–19.4)
Kappa, lambda light chain ratio: 0.96 (ref 0.26–1.65)
Lambda free light chains: 9.7 mg/L (ref 5.7–26.3)

## 2024-05-22 LAB — MULTIPLE MYELOMA PANEL, SERUM
Albumin SerPl Elph-Mcnc: 2.8 g/dL — ABNORMAL LOW (ref 2.9–4.4)
Albumin/Glob SerPl: 1.2 (ref 0.7–1.7)
Alpha 1: 0.4 g/dL (ref 0.0–0.4)
Alpha2 Glob SerPl Elph-Mcnc: 0.8 g/dL (ref 0.4–1.0)
B-Globulin SerPl Elph-Mcnc: 0.8 g/dL (ref 0.7–1.3)
Gamma Glob SerPl Elph-Mcnc: 0.5 g/dL (ref 0.4–1.8)
Globulin, Total: 2.4 g/dL (ref 2.2–3.9)
IgA: 64 mg/dL (ref 61–437)
IgG (Immunoglobin G), Serum: 523 mg/dL — ABNORMAL LOW (ref 603–1613)
IgM (Immunoglobulin M), Srm: 12 mg/dL — ABNORMAL LOW (ref 15–143)
Total Protein ELP: 5.2 g/dL — ABNORMAL LOW (ref 6.0–8.5)

## 2024-05-25 ENCOUNTER — Other Ambulatory Visit: Payer: Self-pay

## 2024-05-27 ENCOUNTER — Other Ambulatory Visit: Payer: Self-pay

## 2024-05-28 ENCOUNTER — Encounter: Payer: Self-pay | Admitting: Hematology

## 2024-05-28 ENCOUNTER — Ambulatory Visit: Admitting: Hematology

## 2024-05-28 ENCOUNTER — Ambulatory Visit

## 2024-05-28 ENCOUNTER — Inpatient Hospital Stay: Attending: Hematology

## 2024-05-28 ENCOUNTER — Other Ambulatory Visit

## 2024-05-28 ENCOUNTER — Other Ambulatory Visit: Payer: Self-pay

## 2024-05-28 ENCOUNTER — Inpatient Hospital Stay

## 2024-05-28 VITALS — BP 113/54 | HR 52 | Temp 98.0°F | Resp 16 | Ht 67.0 in | Wt 165.8 lb

## 2024-05-28 DIAGNOSIS — D701 Agranulocytosis secondary to cancer chemotherapy: Secondary | ICD-10-CM | POA: Insufficient documentation

## 2024-05-28 DIAGNOSIS — C9002 Multiple myeloma in relapse: Secondary | ICD-10-CM | POA: Insufficient documentation

## 2024-05-28 DIAGNOSIS — N401 Enlarged prostate with lower urinary tract symptoms: Secondary | ICD-10-CM | POA: Insufficient documentation

## 2024-05-28 DIAGNOSIS — Z79620 Long term (current) use of immunosuppressive biologic: Secondary | ICD-10-CM | POA: Insufficient documentation

## 2024-05-28 DIAGNOSIS — T451X5A Adverse effect of antineoplastic and immunosuppressive drugs, initial encounter: Secondary | ICD-10-CM | POA: Insufficient documentation

## 2024-05-28 DIAGNOSIS — K7460 Unspecified cirrhosis of liver: Secondary | ICD-10-CM

## 2024-05-28 DIAGNOSIS — C9000 Multiple myeloma not having achieved remission: Secondary | ICD-10-CM

## 2024-05-28 DIAGNOSIS — Z5112 Encounter for antineoplastic immunotherapy: Secondary | ICD-10-CM | POA: Insufficient documentation

## 2024-05-28 DIAGNOSIS — R001 Bradycardia, unspecified: Secondary | ICD-10-CM | POA: Insufficient documentation

## 2024-05-28 DIAGNOSIS — K7581 Nonalcoholic steatohepatitis (NASH): Secondary | ICD-10-CM | POA: Insufficient documentation

## 2024-05-28 DIAGNOSIS — K7469 Other cirrhosis of liver: Secondary | ICD-10-CM | POA: Insufficient documentation

## 2024-05-28 LAB — CBC WITH DIFFERENTIAL/PLATELET
Abs Immature Granulocytes: 0.01 K/uL (ref 0.00–0.07)
Basophils Absolute: 0 K/uL (ref 0.0–0.1)
Basophils Relative: 1 %
Eosinophils Absolute: 0.4 K/uL (ref 0.0–0.5)
Eosinophils Relative: 11 %
HCT: 38.7 % — ABNORMAL LOW (ref 39.0–52.0)
Hemoglobin: 13 g/dL (ref 13.0–17.0)
Immature Granulocytes: 0 %
Lymphocytes Relative: 16 %
Lymphs Abs: 0.5 K/uL — ABNORMAL LOW (ref 0.7–4.0)
MCH: 32.5 pg (ref 26.0–34.0)
MCHC: 33.6 g/dL (ref 30.0–36.0)
MCV: 96.8 fL (ref 80.0–100.0)
Monocytes Absolute: 0.2 K/uL (ref 0.1–1.0)
Monocytes Relative: 6 %
Neutro Abs: 2.2 K/uL (ref 1.7–7.7)
Neutrophils Relative %: 66 %
Platelets: 236 K/uL (ref 150–400)
RBC: 4 MIL/uL — ABNORMAL LOW (ref 4.22–5.81)
RDW: 14.1 % (ref 11.5–15.5)
WBC: 3.3 K/uL — ABNORMAL LOW (ref 4.0–10.5)
nRBC: 0 % (ref 0.0–0.2)

## 2024-05-28 LAB — COMPREHENSIVE METABOLIC PANEL WITH GFR
ALT: 22 U/L (ref 0–44)
AST: 24 U/L (ref 15–41)
Albumin: 3.8 g/dL (ref 3.5–5.0)
Alkaline Phosphatase: 238 U/L — ABNORMAL HIGH (ref 38–126)
Anion gap: 4 — ABNORMAL LOW (ref 5–15)
BUN: 17 mg/dL (ref 8–23)
CO2: 30 mmol/L (ref 22–32)
Calcium: 8.5 mg/dL — ABNORMAL LOW (ref 8.9–10.3)
Chloride: 106 mmol/L (ref 98–111)
Creatinine, Ser: 0.63 mg/dL (ref 0.61–1.24)
GFR, Estimated: >60 mL/min (ref >60)
Glucose, Bld: 81 mg/dL (ref 70–99)
Potassium: 3.8 mmol/L (ref 3.5–5.1)
Sodium: 140 mmol/L (ref 135–145)
Total Bilirubin: 0.6 mg/dL (ref 0.0–1.2)
Total Protein: 6.1 g/dL — ABNORMAL LOW (ref 6.5–8.1)

## 2024-05-28 LAB — PROTIME-INR
INR: 1 (ref 0.8–1.2)
Prothrombin Time: 14.1 s (ref 11.4–15.2)

## 2024-05-28 LAB — GAMMA GT: GGT: 27 U/L (ref 7–50)

## 2024-05-28 MED ORDER — DARATUMUMAB-HYALURONIDASE-FIHJ 1800-30000 MG-UT/15ML ~~LOC~~ SOLN
1800.0000 mg | Freq: Once | SUBCUTANEOUS | Status: AC
  Administered 2024-05-28: 1800 mg via SUBCUTANEOUS
  Filled 2024-05-28: qty 15

## 2024-05-28 MED ORDER — ACETAMINOPHEN 325 MG PO TABS
650.0000 mg | ORAL_TABLET | Freq: Once | ORAL | Status: AC
  Administered 2024-05-28: 650 mg via ORAL
  Filled 2024-05-28: qty 2

## 2024-05-28 MED ORDER — DIPHENHYDRAMINE HCL 25 MG PO CAPS
25.0000 mg | ORAL_CAPSULE | Freq: Once | ORAL | Status: AC
  Administered 2024-05-28: 25 mg via ORAL
  Filled 2024-05-28: qty 1

## 2024-05-28 MED ORDER — DEXAMETHASONE 6 MG PO TABS
20.0000 mg | ORAL_TABLET | Freq: Once | ORAL | Status: AC
  Administered 2024-05-28: 20 mg via ORAL
  Filled 2024-05-28: qty 2

## 2024-05-28 NOTE — Addendum Note (Signed)
 Encounter addended by: Lanell Donald Stagger, PA-C on: 05/28/2024 8:21 AM  Actions taken: Clinical Note Signed

## 2024-05-29 LAB — AFP TUMOR MARKER: AFP, Serum, Tumor Marker: 2.6 ng/mL (ref 0.0–8.4)

## 2024-06-03 NOTE — Assessment & Plan Note (Signed)
-   Secondary to multiple myeloma -Patient received IV fluids, Zometa  and calcitonin, resolved after medical treatment.

## 2024-06-03 NOTE — Assessment & Plan Note (Signed)
-  IgG kappa type, stage I - Patient with admitted to the hospital on March 29, 2023 for hypercalcemia and diffuse lytic bone lesions on CT and MRI. -Bone marrow biopsy showed plasmacytoma.  Due to the sample limitation, the accurate percentage of plasma cell cannot be determined -Multiple myeloma showed IgG kappa monoclonal protein 1.1g/dl, elevated serum IgG and kappa light chain level with ratio 4.2.  Beta-2 microglobulin 3.0, LDH normal. -Based on the above lab results, diffuse bone lesions, hypercalcemia, mild renal insufficiency and the bone marrow biopsy, patient meets the criteria for multiple myeloma -I recommended induction chemotherapy Dara, Revlimid  and dexa, he started on 04/23/2024

## 2024-06-04 ENCOUNTER — Inpatient Hospital Stay

## 2024-06-04 ENCOUNTER — Inpatient Hospital Stay (HOSPITAL_BASED_OUTPATIENT_CLINIC_OR_DEPARTMENT_OTHER): Admitting: Nurse Practitioner

## 2024-06-04 ENCOUNTER — Inpatient Hospital Stay: Admitting: Hematology

## 2024-06-04 ENCOUNTER — Other Ambulatory Visit

## 2024-06-04 ENCOUNTER — Encounter: Payer: Self-pay | Admitting: Nurse Practitioner

## 2024-06-04 ENCOUNTER — Other Ambulatory Visit: Payer: Self-pay

## 2024-06-04 VITALS — BP 117/55 | HR 44 | Temp 98.2°F | Resp 17 | Ht 67.0 in | Wt 169.6 lb

## 2024-06-04 DIAGNOSIS — T451X5A Adverse effect of antineoplastic and immunosuppressive drugs, initial encounter: Secondary | ICD-10-CM | POA: Diagnosis not present

## 2024-06-04 DIAGNOSIS — R001 Bradycardia, unspecified: Secondary | ICD-10-CM | POA: Diagnosis not present

## 2024-06-04 DIAGNOSIS — Z515 Encounter for palliative care: Secondary | ICD-10-CM | POA: Diagnosis not present

## 2024-06-04 DIAGNOSIS — R530 Neoplastic (malignant) related fatigue: Secondary | ICD-10-CM | POA: Diagnosis not present

## 2024-06-04 DIAGNOSIS — Z79620 Long term (current) use of immunosuppressive biologic: Secondary | ICD-10-CM | POA: Diagnosis not present

## 2024-06-04 DIAGNOSIS — C9000 Multiple myeloma not having achieved remission: Secondary | ICD-10-CM

## 2024-06-04 DIAGNOSIS — D701 Agranulocytosis secondary to cancer chemotherapy: Secondary | ICD-10-CM | POA: Diagnosis not present

## 2024-06-04 DIAGNOSIS — G893 Neoplasm related pain (acute) (chronic): Secondary | ICD-10-CM | POA: Diagnosis not present

## 2024-06-04 DIAGNOSIS — Z7189 Other specified counseling: Secondary | ICD-10-CM

## 2024-06-04 DIAGNOSIS — C9002 Multiple myeloma in relapse: Secondary | ICD-10-CM | POA: Diagnosis not present

## 2024-06-04 DIAGNOSIS — Z5112 Encounter for antineoplastic immunotherapy: Secondary | ICD-10-CM | POA: Diagnosis not present

## 2024-06-04 LAB — CBC WITH DIFFERENTIAL (CANCER CENTER ONLY)
Abs Immature Granulocytes: 0.01 K/uL (ref 0.00–0.07)
Basophils Absolute: 0 K/uL (ref 0.0–0.1)
Basophils Relative: 1 %
Eosinophils Absolute: 0.4 K/uL (ref 0.0–0.5)
Eosinophils Relative: 12 %
HCT: 37.4 % — ABNORMAL LOW (ref 39.0–52.0)
Hemoglobin: 12.6 g/dL — ABNORMAL LOW (ref 13.0–17.0)
Immature Granulocytes: 0 %
Lymphocytes Relative: 16 %
Lymphs Abs: 0.5 K/uL — ABNORMAL LOW (ref 0.7–4.0)
MCH: 32.8 pg (ref 26.0–34.0)
MCHC: 33.7 g/dL (ref 30.0–36.0)
MCV: 97.4 fL (ref 80.0–100.0)
Monocytes Absolute: 0.3 K/uL (ref 0.1–1.0)
Monocytes Relative: 11 %
Neutro Abs: 1.8 K/uL (ref 1.7–7.7)
Neutrophils Relative %: 60 %
Platelet Count: 171 K/uL (ref 150–400)
RBC: 3.84 MIL/uL — ABNORMAL LOW (ref 4.22–5.81)
RDW: 14 % (ref 11.5–15.5)
WBC Count: 3 K/uL — ABNORMAL LOW (ref 4.0–10.5)
nRBC: 0 % (ref 0.0–0.2)

## 2024-06-04 MED ORDER — LENALIDOMIDE 25 MG PO CAPS: 25.0000 mg | ORAL_CAPSULE | Freq: Every day | ORAL | 0 refills | Status: DC

## 2024-06-04 MED ORDER — DEXAMETHASONE 6 MG PO TABS
20.0000 mg | ORAL_TABLET | Freq: Once | ORAL | Status: AC
  Administered 2024-06-04: 20 mg via ORAL
  Filled 2024-06-04: qty 2

## 2024-06-04 MED ORDER — DIPHENHYDRAMINE HCL 25 MG PO CAPS
25.0000 mg | ORAL_CAPSULE | Freq: Once | ORAL | Status: AC
  Administered 2024-06-04: 25 mg via ORAL
  Filled 2024-06-04: qty 1

## 2024-06-04 MED ORDER — OXYCODONE HCL 5 MG PO TABS: 5.0000 mg | ORAL_TABLET | Freq: Three times a day (TID) | ORAL | 0 refills | Status: DC | PRN

## 2024-06-04 MED ORDER — ACETAMINOPHEN 325 MG PO TABS
650.0000 mg | ORAL_TABLET | Freq: Once | ORAL | Status: AC
  Administered 2024-06-04: 650 mg via ORAL
  Filled 2024-06-04: qty 2

## 2024-06-04 MED ORDER — DARATUMUMAB-HYALURONIDASE-FIHJ 1800-30000 MG-UT/15ML ~~LOC~~ SOLN
1800.0000 mg | Freq: Once | SUBCUTANEOUS | Status: AC
  Administered 2024-06-04: 1800 mg via SUBCUTANEOUS
  Filled 2024-06-04: qty 15

## 2024-06-04 NOTE — Progress Notes (Signed)
 Palliative Medicine [facility]  Telephone:(336) [phone] Fax:(336) [phone]   Name: Joe [name] Date: 06/04/2024 MRN: [id number]  DOB: February 22, 1951  Patient Care Team: [name], MD as PCP - General (Family Medicine)    REASON FOR CONSULTATION: Joe [name] is a 73 y.o. male with oncologic medical history including multiple myeloma currently undergoing treatment with Revlimid , dexamethasone  and daratumumab  .  Palliative is seeing patient for symptom management and goals of care.    SOCIAL HISTORY:     reports that he has never smoked. He does not have any smokeless tobacco history on file. He reports that he does not drink alcohol and does not use drugs.  ADVANCE DIRECTIVES:  Patient has a documented directive. Reports his daughter, [name] and niece [name] is his Clinical research associate.    CODE STATUS: Full code  PAST MEDICAL HISTORY: Past Medical History:  Diagnosis Date   Anxiety    Diabetes mellitus without complication (HCC)     PAST SURGICAL HISTORY: History reviewed. No pertinent surgical history.  HEMATOLOGY/ONCOLOGY HISTORY:  Oncology History  Multiple myeloma (HCC)  04/08/2024 Initial Diagnosis   Multiple myeloma (HCC)   04/23/2024 - 04/23/2024 Chemotherapy   Patient is on Treatment Plan : MYELOMA NEWLY DIAGNOSED Daratumumab  IV + Lenalidomide  + Dexamethasone  Weekly (DaraRd) q28d     04/23/2024 -  Chemotherapy   Patient is on Treatment Plan : MYELOMA RELAPSED REFRACTORY Daratumumab  SQ + Lenalidomide  + Dexamethasone  (DaraRd) q28d       ALLERGIES:  has no known allergies.  MEDICATIONS:  Current Outpatient Medications  Medication Sig Dispense Refill   calcium  citrate (CALCITRATE - DOSED IN MG ELEMENTAL CALCIUM ) 950 (200 Ca) MG tablet Take 600 mg of elemental calcium  by mouth 3 (three) times daily.     cholecalciferol (VITAMIN D3) 25 MCG (1000 UNIT) tablet Take 3,000 Units by mouth daily.     Turmeric (QC TUMERIC COMPLEX) 500 MG CAPS Take  4,800 capsules by mouth 2 (two) times daily.     acyclovir  (ZOVIRAX ) 400 MG tablet Take 1 tablet (400 mg total) by mouth 2 (two) times daily. 180 tablet 1   aspirin EC 81 MG tablet Take 81 mg by mouth daily. Swallow whole.     carvedilol  (COREG ) 6.25 MG tablet Take 6.25 mg by mouth 2 (two) times daily with a meal.     diazepam  (VALIUM ) 2 MG tablet Take 2 mg by mouth in the morning and at bedtime.     esomeprazole (NEXIUM) 40 MG capsule Take 40 mg by mouth daily before breakfast.     famotidine  (PEPCID ) 20 MG tablet Take 20 mg by mouth at bedtime.     FLONASE ALLERGY RELIEF 50 MCG/ACT nasal spray Place 1-2 sprays into both nostrils in the morning and at bedtime.     JARDIANCE 10 MG TABS tablet Take 10 mg by mouth daily.     lenalidomide  (REVLIMID ) 25 MG capsule Take 1 capsule (25 mg total) by mouth daily. For 21 days and off for 7 days for a 28 day cycle.  [employer] Auth # [id number]    Date Obtained 06/04/2024 21 capsule 0   [Paused] losartan (COZAAR) 100 MG tablet Take 50 mg by mouth daily.     Multiple Vitamin (MULTIVITAMIN) tablet Take 1 tablet by mouth daily with breakfast.     ondansetron  (ZOFRAN ) 8 MG tablet Take 1 tablet (8 mg total) by mouth every 8 (eight) hours as needed for nausea or vomiting. 30 tablet 1  ONETOUCH VERIO test strip      oxyCODONE  (OXY IR/ROXICODONE ) 5 MG immediate release tablet Take 1 tablet (5 mg total) by mouth every 8 (eight) hours as needed for severe pain (pain score 7-10). 60 tablet 0   rosuvastatin  (CRESTOR ) 10 MG tablet Take 10 mg by mouth daily.     sildenafil (VIAGRA) 100 MG tablet Take 100 mg by mouth daily as needed (for E.D.).     No current facility-administered medications for this visit.    VITAL SIGNS: BP (!) 117/55 (BP Location: Left Arm, Patient Position: Sitting)   Pulse (!) 44   Temp 98.2 F (36.8 C) (Temporal)   Resp 17   Ht 5' 7 (1.702 m)   Wt 169 lb 9.6 oz (76.9 kg)   SpO2 100%   BMI 26.56 kg/m  Filed Weights   06/04/24 0842   Weight: 169 lb 9.6 oz (76.9 kg)    Estimated body mass index is 26.56 kg/m as calculated from the following:   Height as of this encounter: 5' 7 (1.702 m).   Weight as of this encounter: 169 lb 9.6 oz (76.9 kg).  LABS: CBC:    Component Value Date/Time   WBC 3.0 (L) 06/04/2024 0818   WBC 3.3 (L) 05/28/2024 0924   HGB 12.6 (L) 06/04/2024 0818   HCT 37.4 (L) 06/04/2024 0818   PLT 171 06/04/2024 0818   MCV 97.4 06/04/2024 0818   NEUTROABS 1.8 06/04/2024 0818   LYMPHSABS 0.5 (L) 06/04/2024 0818   MONOABS 0.3 06/04/2024 0818   EOSABS 0.4 06/04/2024 0818   BASOSABS 0.0 06/04/2024 0818   Comprehensive Metabolic Panel:    Component Value Date/Time   NA 140 05/28/2024 0924   K 3.8 05/28/2024 0924   CL 106 05/28/2024 0924   CO2 30 05/28/2024 0924   BUN 17 05/28/2024 0924   CREATININE 0.63 05/28/2024 0924   GLUCOSE 81 05/28/2024 0924   CALCIUM  8.5 (L) 05/28/2024 0924   AST 24 05/28/2024 0924   ALT 22 05/28/2024 0924   ALKPHOS 238 (H) 05/28/2024 0924   BILITOT 0.6 05/28/2024 0924   PROT 6.1 (L) 05/28/2024 0924   ALBUMIN 3.8 05/28/2024 0924    RADIOGRAPHIC STUDIES: No results found.  PERFORMANCE STATUS (ECOG) : 1 - Symptomatic but completely ambulatory  Review of Systems  Constitutional:  Positive for fatigue.  Musculoskeletal:  Positive for back pain.  Unless otherwise noted, a complete review of systems is negative.  Physical Exam General: NAD Cardiovascular: regular rate and rhythm Pulmonary: clear ant fields Abdomen: soft, nontender, + bowel sounds Extremities: no edema, no joint deformities Skin: no rashes Neurological: Alert and oriented x3  Discussed the use of AI scribe software for clinical note transcription with the patient, who gave verbal consent to proceed.  History of Present Illness Joe [name] is a 73 year old male with Multiple Myeloma who presents to clinic for his initial palliative visit. No acute distress. He is accompanied by his niece,  [name]. Patient is alert and able to engage appropriately in discussions.  I introduced myself, Maygan RN, and Palliative's role in collaboration with the oncology team. Concept of Palliative Care was introduced as specialized medical care for people and their families living with serious illness.  It focuses on providing relief from the symptoms and stress of a serious illness.  The goal is to improve quality of life for both the patient and the family. Values and goals of care important to patient and family were attempted to  be elicited.  Mr. Sevillano lives in the home with his daughter. [name], who works third shift and provides significant assistance. His niece is also a great support. He previously worked in Systems developer. He shares that he also has an elevator in his home. He rebuilt a car last winter but has not engaged in similar activities since recent diagnosis and health changes.   Mr. Fedor is able to perform most ADLs independently with some limitations due to fatigue and pain. He actively  drives, and engages in home activities. He has a cane however does not require daily use.   He experiences muscular pain after physical activities, such as bending over multiple times. Oxycodone , taken three times a day in the morning, mid-afternoon, and at night, alleviates this pain and aids sleep. He sleeps well except for frequent nocturnal urination.  No issues with constipation or diarrhea, and he reports regular bowel movements. No numbness or tingling in his extremities, though he previously had numbness in his hand due to a cut, which has resolved. Sleeps well at night however has to get up often to urinate.   We discussed his pain at length. Mr. Woolverton complains of back discomfort which he takes Tumeric to assist with joint discomfort. He also is taking Oxycodone  as needed for his pain which he feels is controlling symptoms. Patient reports he averages 3 doses per day. No  adjustment to regimen at this time.   Goals of Care   We discussed [name] current illness and what it means in the larger context of his on-going co-morbidities. Natural disease trajectory and expectations were discussed.  Patient and his family are realistic in their understanding. He is clear in expressed wishes to continue to treat the treatable allow him every opportunity to continue to thrive while managing his symptoms and minimizing any discomfort or distress.  Patient confirms he has a completed advanced directive in the home naming his daughter [name] and his niece [name] as his medical decision-maker in the event he is unable to speak for himself.  Mr. Rothe would not want his life prolonged in the event of a terminal illness, irreversible causes, or vegetative state.  I encouraged patient to bring in his advance directive to have on file.  I discussed the importance of continued conversation with family and their medical providers regarding overall plan of care and treatment options, ensuring decisions are within the context of the patients values and GOCs. Assessment & Plan Established therapeutic relationship. Education provided on palliative's role in collaboration with their Oncology/Radiation team.  Multiple myeloma not having achieved remission No current changes in symptoms or treatment plan. Continues to manage symptoms with current medications. - Continue current treatment regimen per oncology  Cancer related pain Cancer related pain managed with oxycodone , taken three times daily. Pain primarily muscular and in his back, exacerbated by physical activity. Oxycodone  provides effective relief, allowing for daily activities. - Continue oxycodone  3 times daily as needed for pain management - Provide prescription refill for oxycodone  - Provide prescription for medical marijuana to be filled in [place]  or [place] for symptom management - Continue turmeric as  needed  Gastroesophageal reflux disease without esophagitis GERD managed with Nexium and Pepcid . - Continue Nexium and Pepcid  as prescribed  Nocturia Nocturia persists despite reduction in fluid intake.  Goals of Care Discussion of advance directives and healthcare power of attorney. [name] and [name] designated as healthcare proxies. Documents need to be finalized and completed. - Complete advance directive  and healthcare power of attorney documents  I will plan to see patient back in 2-3 weeks.  Sooner if needed.  Patient expressed understanding and was in agreement with this plan. He also understands that He can call the clinic at any time with any questions, concerns, or complaints.   Thank you for your referral and allowing Palliative to assist in Mr. Mcguire Gasparyan [facility] care.   Number and complexity of problems addressed: HIGH - 1 or more chronic illnesses with SEVERE exacerbation, progression, or side effects of treatment - advanced cancer, pain. Any controlled substances utilized were prescribed in the context of palliative care.  Visit consisted of counseling and education dealing with the complex and emotionally intense issues of symptom management and palliative care in the setting of serious and potentially life-threatening illness.  Signed by: Levon Borer, AGPCNP-BC Palliative Medicine Team/[facility]

## 2024-06-04 NOTE — Progress Notes (Signed)
 [facility]   Telephone:(336) [phone] Fax:([phone]   Clinic Follow up Note   Patient Care Team: Joe Cole, MD as PCP - General (Family Medicine)  Date of Service:  06/04/2024  CHIEF COMPLAINT: f/u of multiple myeloma  CURRENT THERAPY:  Induction chemotherapy durvalumab, dexamethasone  and Revlimid   Oncology History   Hypercalcemia of malignancy - Secondary to multiple myeloma -Patient received IV fluids, Zometa  and calcitonin, resolved after medical treatment.  Multiple myeloma (HCC) -IgG kappa type, stage I - Patient with admitted to the hospital on March 29, 2023 for hypercalcemia and diffuse lytic bone lesions on CT and MRI. -Bone marrow biopsy showed plasmacytoma.  Due to the sample limitation, the accurate percentage of plasma cell cannot be determined -Multiple myeloma showed IgG kappa monoclonal protein 1.1g/dl, elevated serum IgG and kappa light chain level with ratio 4.2.  Beta-2 microglobulin 3.0, LDH normal. -Based on the above lab results, diffuse bone lesions, hypercalcemia, mild renal insufficiency and the bone marrow biopsy, patient meets the criteria for multiple myeloma -I recommended induction chemotherapy Dara, Revlimid  and dexa, he started on 04/23/2024  Assessment & Plan Multiple myeloma not in remission Multiple myeloma is not in remission. He is currently undergoing treatment and reports significant improvement since starting therapy. He is on the second cycle of treatment and will transition to bi-weekly injections after the injection on October 28th. Maintenance therapy with Revlimid  is planned post-induction. Blood counts are well-managed with mild anemia and leukopenia attributed to Revlimid . - Continue current treatment regimen - Transition to bi-weekly injections after October 28th - Continue Revlimid  as maintenance therapy  Mild anemia Mild anemia is likely secondary to Revlimid  treatment. Blood counts are sufficient for ongoing  treatment.  Generalized pain and muscle weakness Reports generalized pain and stiffness, particularly upon waking. Muscle strength is improving, but he experiences difficulty with certain movements. He is not currently engaging in structured exercise but performs some stretching and walking. - Encourage starting low to moderate weight exercises - Advise use of dumbbells and resistance bands - Caution against lifting heavy weights due to frail bones  Bradycardia Bradycardia with heart rate as low as 44 bpm. He is on carvedilol , which may contribute to low heart rate. No history of heart disease or myocardial infarction. Carvedilol  was prescribed for fatty liver disease, which is not a typical indication. - Monitor heart rate regularly - Adjust carvedilol  dosage based on heart rate - Consult with primary care physician regarding carvedilol  use  Plan - He is clinically doing well overall, continue current therapy. - Lab reviewed, adequate for treatment, will proceed to daratumumab  injection today and continue every week, changed to biweekly after treatment on October 28 - Will refill Revlimid  today - Follow-up in 2 weeks   SUMMARY OF ONCOLOGIC HISTORY: Oncology History  Multiple myeloma (HCC)  04/08/2024 Initial Diagnosis   Multiple myeloma (HCC)   04/23/2024 - 04/23/2024 Chemotherapy   Patient is on Treatment Plan : MYELOMA NEWLY DIAGNOSED Daratumumab  IV + Lenalidomide  + Dexamethasone  Weekly (DaraRd) q28d     04/23/2024 -  Chemotherapy   Patient is on Treatment Plan : MYELOMA RELAPSED REFRACTORY Daratumumab  SQ + Lenalidomide  + Dexamethasone  (DaraRd) q28d        Discussed the use of AI scribe software for clinical note transcription with the patient, who gave verbal consent to proceed.  History of Present Illness Joe [name] is a 73 year old male with multiple myeloma who presents for follow-up.  He experiences generalized body pain, described as  stiffness and soreness,  particularly upon getting out of bed or after remaining in the same position for extended periods. Since starting treatment, he feels significantly better and no longer requires a cane for walking. He engages in outdoor activities but experiences some muscle weakness, especially in his legs and arms. He is working on regaining strength and has started walking stairs for exercise.  He takes dexamethasone  weekly and Revlimid . His blood counts have shown a low white count and anemia, but his platelet count is normal.     All other systems were reviewed with the patient and are negative.  MEDICAL HISTORY:  Past Medical History:  Diagnosis Date   Anxiety    Diabetes mellitus without complication (HCC)     SURGICAL HISTORY: No past surgical history on file.  I have reviewed the social history and family history with the patient and they are unchanged from previous note.  ALLERGIES:  has no known allergies.  MEDICATIONS:  Current Outpatient Medications  Medication Sig Dispense Refill   acyclovir  (ZOVIRAX ) 400 MG tablet Take 1 tablet (400 mg total) by mouth 2 (two) times daily. 180 tablet 1   aspirin EC 81 MG tablet Take 81 mg by mouth daily. Swallow whole.     calcium  citrate (CALCITRATE - DOSED IN MG ELEMENTAL CALCIUM ) 950 (200 Ca) MG tablet Take 600 mg of elemental calcium  by mouth 3 (three) times daily.     carvedilol  (COREG ) 6.25 MG tablet Take 6.25 mg by mouth 2 (two) times daily with a meal.     cholecalciferol (VITAMIN D3) 25 MCG (1000 UNIT) tablet Take 3,000 Units by mouth daily.     diazepam  (VALIUM ) 2 MG tablet Take 2 mg by mouth in the morning and at bedtime.     esomeprazole (NEXIUM) 40 MG capsule Take 40 mg by mouth daily before breakfast.     famotidine  (PEPCID ) 20 MG tablet Take 20 mg by mouth at bedtime.     FLONASE ALLERGY RELIEF 50 MCG/ACT nasal spray Place 1-2 sprays into both nostrils in the morning and at bedtime.     JARDIANCE 10 MG TABS tablet Take 10 mg by mouth  daily.     lenalidomide  (REVLIMID ) 25 MG capsule Take 1 capsule (25 mg total) by mouth daily. For 21 days and off for 7 days for a 28 day cycle.  [employer] Auth # [id number]    Date Obtained 06/04/2024 21 capsule 0   [Paused] losartan (COZAAR) 100 MG tablet Take 50 mg by mouth daily.     Multiple Vitamin (MULTIVITAMIN) tablet Take 1 tablet by mouth daily with breakfast.     ondansetron  (ZOFRAN ) 8 MG tablet Take 1 tablet (8 mg total) by mouth every 8 (eight) hours as needed for nausea or vomiting. 30 tablet 1   ONETOUCH VERIO test strip      oxyCODONE  (OXY IR/ROXICODONE ) 5 MG immediate release tablet Take 1 tablet (5 mg total) by mouth every 8 (eight) hours as needed for severe pain (pain score 7-10). 60 tablet 0   rosuvastatin  (CRESTOR ) 10 MG tablet Take 10 mg by mouth daily.     sildenafil (VIAGRA) 100 MG tablet Take 100 mg by mouth daily as needed (for E.D.).     Turmeric (QC TUMERIC COMPLEX) 500 MG CAPS Take 4,800 capsules by mouth 2 (two) times daily.     No current facility-administered medications for this visit.    PHYSICAL EXAMINATION: ECOG PERFORMANCE STATUS: 2 - Symptomatic, <50% confined to bed  There were  no vitals filed for this visit. Wt Readings from Last 3 Encounters:  06/04/24 169 lb 9.6 oz (76.9 kg)  05/28/24 165 lb 12.8 oz (75.2 kg)  05/20/24 168 lb 11.2 oz (76.5 kg)     GENERAL:alert, no distress and comfortable SKIN: skin color, texture, turgor are normal, no rashes or significant lesions EYES: normal, Conjunctiva are pink and non-injected, sclera clear Musculoskeletal:no cyanosis of digits and no clubbing  NEURO: alert & oriented x 3 with fluent speech, no focal motor/sensory deficits  Physical Exam   LABORATORY DATA:  I have reviewed the data as listed    Latest Ref Rng & Units 06/04/2024    8:18 AM 05/28/2024    9:24 AM 05/20/2024    8:08 AM  CBC  WBC 4.0 - 10.5 K/uL 3.0  3.3  2.6   Hemoglobin 13.0 - 17.0 g/dL 87.3  86.9  88.1   Hematocrit 39.0 - 52.0 %  37.4  38.7  35.2   Platelets 150 - 400 K/uL 171  236  222         Latest Ref Rng & Units 05/28/2024    9:24 AM 05/14/2024    7:33 AM 05/06/2024    9:37 AM  CMP  Glucose 70 - 99 mg/dL 81  882  895   BUN 8 - 23 mg/dL 17  14  12    Creatinine 0.61 - 1.24 mg/dL 9.36  9.36  9.35   Sodium 135 - 145 mmol/L 140  143  143   Potassium 3.5 - 5.1 mmol/L 3.8  3.9  4.0   Chloride 98 - 111 mmol/L 106  109  108   CO2 22 - 32 mmol/L 30  30  30    Calcium  8.9 - 10.3 mg/dL 8.5  7.4  8.1   Total Protein 6.5 - 8.1 g/dL 6.1  5.6  6.4   Total Bilirubin 0.0 - 1.2 mg/dL 0.6  0.6  0.7   Alkaline Phos 38 - 126 U/L 238  290  208   AST 15 - 41 U/L 24  57  34   ALT 0 - 44 U/L 22  46  28       RADIOGRAPHIC STUDIES: I have personally reviewed the radiological images as listed and agreed with the findings in the report. No results found.    No orders of the defined types were placed in this encounter.  All questions were answered. The patient knows to call the clinic with any problems, questions or concerns. No barriers to learning was detected. The total time spent in the appointment was 25 minutes, including review of chart and various tests results, discussions about plan of care and coordination of care plan     Onita Mattock, MD 06/04/2024

## 2024-06-04 NOTE — Patient Instructions (Signed)
 [facility] - A DEPT OF [facility]  Discharge Instructions: Thank you for choosing [facility] to provide your oncology and hematology care.   If you have a lab appointment with the Cancer Center, please go directly to the Cancer Center and check in at the registration area.   Wear comfortable clothing and clothing appropriate for easy access to any Portacath or PICC line.   We strive to give you quality time with your provider. You may need to reschedule your appointment if you arrive late (15 or more minutes).  Arriving late affects you and other patients whose appointments are after yours.  Also, if you miss three or more appointments without notifying the office, you may be dismissed from the clinic at the provider's discretion.      For prescription refill requests, have your pharmacy contact our office and allow 72 hours for refills to be completed.    Today you received the following chemotherapy and/or immunotherapy agents: daratumumab-hyaluronidase-fihj (DARZALEX FASPRO       To help prevent nausea and vomiting after your treatment, we encourage you to take your nausea medication as directed.  BELOW ARE SYMPTOMS THAT SHOULD BE REPORTED IMMEDIATELY: *FEVER GREATER THAN 100.4 F (38 C) OR HIGHER *CHILLS OR SWEATING *NAUSEA AND VOMITING THAT IS NOT CONTROLLED WITH YOUR NAUSEA MEDICATION *UNUSUAL SHORTNESS OF BREATH *UNUSUAL BRUISING OR BLEEDING *URINARY PROBLEMS (pain or burning when urinating, or frequent urination) *BOWEL PROBLEMS (unusual diarrhea, constipation, pain near the anus) TENDERNESS IN MOUTH AND THROAT WITH OR WITHOUT PRESENCE OF ULCERS (sore throat, sores in mouth, or a toothache) UNUSUAL RASH, SWELLING OR PAIN  UNUSUAL VAGINAL DISCHARGE OR ITCHING   Items with * indicate a potential emergency and should be followed up as soon as possible or go to the Emergency Department if any problems should occur.  Please show the  CHEMOTHERAPY ALERT CARD or IMMUNOTHERAPY ALERT CARD at check-in to the Emergency Department and triage nurse.  Should you have questions after your visit or need to cancel or reschedule your appointment, please contact [facility] - A DEPT OF [facility]  Dept: [phone]  and follow the prompts.  Office hours are 8:00 a.m. to 4:30 p.m. Monday - Friday. Please note that voicemails left after 4:00 p.m. may not be returned until the following business day.  We are closed weekends and major holidays. You have access to a nurse at all times for urgent questions. Please call the main number to the clinic Dept: [phone] and follow the prompts.   For any non-urgent questions, you may also contact your provider using MyChart. We now offer e-Visits for anyone 63 and older to request care online for non-urgent symptoms. For details visit [email or web address].   Also download the MyChart app! Go to the app store, search "MyChart", open the app, select [facility], and log in with your MyChart username and password.

## 2024-06-05 ENCOUNTER — Other Ambulatory Visit: Payer: Self-pay

## 2024-06-06 NOTE — Progress Notes (Signed)
  Radiation Oncology         [phone] ________________________________  Name: Joe [name] MRN: [id number]  Date of Service: 06/09/2024  DOB: 1951-03-28  Post Treatment Telephone Note  Diagnosis: Multiple Myeloma with painful bone disease.    First Treatment Date: 2024-04-22 Last Treatment Date: 2024-05-06   Plan Name: Spine_Sacrum Site: Sacrum Technique: Isodose Plan Mode: Photon Dose Per Fraction: 2.5 Gy Prescribed Dose (Delivered / Prescribed): 25 Gy / 25 Gy Prescribed Fxs (Delivered / Prescribed): 10 / 10   Plan Name: Chest_R_Rib Site: Ribs, Right Technique: Isodose Plan Mode: Photon Dose Per Fraction: 2.5 Gy Prescribed Dose (Delivered / Prescribed): 25 Gy / 25 Gy Prescribed Fxs (Delivered / Prescribed): 10 / 10    The patient was available for call today.  The patient did not note fatigue during radiation. The patient did not note skin changes in the field of radiation during therapy. The patient has noticed improvement in pain in the area(s) treated with radiation. The patient is not taking dexamethasone . The patient does not have symptoms of  weakness or loss of control of the extremities. The patient does not have symptoms of headache. The patient does not have symptoms of seizure or uncontrolled movement. The patient does not have symptoms of changes in vision. The patient does not have changes in speech. The patient does not have confusion.   The patient is scheduled for ongoing care with Dr. Lanny in medical oncology. The patient was encouraged to call if he develops concerns or questions regarding radiation.

## 2024-06-09 ENCOUNTER — Ambulatory Visit
Admission: RE | Admit: 2024-06-09 | Discharge: 2024-06-09 | Disposition: A | Discharge status: Home / Self Care | Source: Ambulatory Visit | Attending: Radiation Oncology | Admitting: Radiation Oncology

## 2024-06-09 DIAGNOSIS — C9000 Multiple myeloma not having achieved remission: Secondary | ICD-10-CM

## 2024-06-10 DIAGNOSIS — F419 Anxiety disorder, unspecified: Secondary | ICD-10-CM | POA: Diagnosis not present

## 2024-06-10 DIAGNOSIS — K219 Gastro-esophageal reflux disease without esophagitis: Secondary | ICD-10-CM | POA: Diagnosis not present

## 2024-06-10 DIAGNOSIS — Z23 Encounter for immunization: Secondary | ICD-10-CM | POA: Diagnosis not present

## 2024-06-10 DIAGNOSIS — R399 Unspecified symptoms and signs involving the genitourinary system: Secondary | ICD-10-CM | POA: Diagnosis not present

## 2024-06-10 DIAGNOSIS — K7581 Nonalcoholic steatohepatitis (NASH): Secondary | ICD-10-CM | POA: Diagnosis not present

## 2024-06-10 DIAGNOSIS — C9000 Multiple myeloma not having achieved remission: Secondary | ICD-10-CM | POA: Diagnosis not present

## 2024-06-10 DIAGNOSIS — Z136 Encounter for screening for cardiovascular disorders: Secondary | ICD-10-CM | POA: Diagnosis not present

## 2024-06-10 DIAGNOSIS — E1169 Type 2 diabetes mellitus with other specified complication: Secondary | ICD-10-CM | POA: Diagnosis not present

## 2024-06-10 DIAGNOSIS — I10 Essential (primary) hypertension: Secondary | ICD-10-CM | POA: Diagnosis not present

## 2024-06-10 DIAGNOSIS — Z13220 Encounter for screening for lipoid disorders: Secondary | ICD-10-CM | POA: Diagnosis not present

## 2024-06-10 DIAGNOSIS — E785 Hyperlipidemia, unspecified: Secondary | ICD-10-CM | POA: Diagnosis not present

## 2024-06-10 NOTE — Assessment & Plan Note (Signed)
-  IgG kappa type, stage I - Patient with admitted to the hospital on March 29, 2023 for hypercalcemia and diffuse lytic bone lesions on CT and MRI. -Bone marrow biopsy showed plasmacytoma.  Due to the sample limitation, the accurate percentage of plasma cell cannot be determined -Multiple myeloma showed IgG kappa monoclonal protein 1.1g/dl, elevated serum IgG and kappa light chain level with ratio 4.2.  Beta-2 microglobulin 3.0, LDH normal. -Based on the above lab results, diffuse bone lesions, hypercalcemia, mild renal insufficiency and the bone marrow biopsy, patient meets the criteria for multiple myeloma -I recommended induction chemotherapy Dara, Revlimid  and dexa, he started on 04/23/2024

## 2024-06-10 NOTE — Assessment & Plan Note (Signed)
-   Secondary to multiple myeloma -Patient received IV fluids, Zometa  and calcitonin, resolved after medical treatment.

## 2024-06-11 ENCOUNTER — Inpatient Hospital Stay

## 2024-06-11 ENCOUNTER — Inpatient Hospital Stay: Admitting: Hematology

## 2024-06-11 ENCOUNTER — Ambulatory Visit: Admitting: Hematology

## 2024-06-11 ENCOUNTER — Telehealth: Payer: Self-pay | Admitting: Hematology

## 2024-06-11 ENCOUNTER — Other Ambulatory Visit

## 2024-06-11 DIAGNOSIS — T451X5A Adverse effect of antineoplastic and immunosuppressive drugs, initial encounter: Secondary | ICD-10-CM | POA: Diagnosis not present

## 2024-06-11 DIAGNOSIS — C9002 Multiple myeloma in relapse: Secondary | ICD-10-CM | POA: Diagnosis not present

## 2024-06-11 DIAGNOSIS — Z5112 Encounter for antineoplastic immunotherapy: Secondary | ICD-10-CM | POA: Diagnosis not present

## 2024-06-11 DIAGNOSIS — C9000 Multiple myeloma not having achieved remission: Secondary | ICD-10-CM

## 2024-06-11 DIAGNOSIS — Z79620 Long term (current) use of immunosuppressive biologic: Secondary | ICD-10-CM | POA: Diagnosis not present

## 2024-06-11 DIAGNOSIS — R001 Bradycardia, unspecified: Secondary | ICD-10-CM | POA: Diagnosis not present

## 2024-06-11 DIAGNOSIS — D701 Agranulocytosis secondary to cancer chemotherapy: Secondary | ICD-10-CM | POA: Diagnosis not present

## 2024-06-11 DIAGNOSIS — E1169 Type 2 diabetes mellitus with other specified complication: Secondary | ICD-10-CM | POA: Diagnosis not present

## 2024-06-11 LAB — CBC WITH DIFFERENTIAL (CANCER CENTER ONLY)
Abs Immature Granulocytes: 0 K/uL (ref 0.00–0.07)
Basophils Absolute: 0 K/uL (ref 0.0–0.1)
Basophils Relative: 0 %
Eosinophils Absolute: 0.5 K/uL (ref 0.0–0.5)
Eosinophils Relative: 21 %
HCT: 36.6 % — ABNORMAL LOW (ref 39.0–52.0)
Hemoglobin: 12.1 g/dL — ABNORMAL LOW (ref 13.0–17.0)
Immature Granulocytes: 0 %
Lymphocytes Relative: 28 %
Lymphs Abs: 0.6 K/uL — ABNORMAL LOW (ref 0.7–4.0)
MCH: 32.3 pg (ref 26.0–34.0)
MCHC: 33.1 g/dL (ref 30.0–36.0)
MCV: 97.6 fL (ref 80.0–100.0)
Monocytes Absolute: 0.3 K/uL (ref 0.1–1.0)
Monocytes Relative: 13 %
Neutro Abs: 0.8 K/uL — ABNORMAL LOW (ref 1.7–7.7)
Neutrophils Relative %: 38 %
Platelet Count: 168 K/uL (ref 150–400)
RBC: 3.75 MIL/uL — ABNORMAL LOW (ref 4.22–5.81)
RDW: 14.6 % (ref 11.5–15.5)
WBC Count: 2.1 K/uL — ABNORMAL LOW (ref 4.0–10.5)
nRBC: 0 % (ref 0.0–0.2)

## 2024-06-11 LAB — COMPREHENSIVE METABOLIC PANEL WITH GFR
ALT: 25 U/L (ref 0–44)
AST: 22 U/L (ref 15–41)
Albumin: 3.4 g/dL — ABNORMAL LOW (ref 3.5–5.0)
Alkaline Phosphatase: 138 U/L — ABNORMAL HIGH (ref 38–126)
Anion gap: 3 — ABNORMAL LOW (ref 5–15)
BUN: 14 mg/dL (ref 8–23)
CO2: 31 mmol/L (ref 22–32)
Calcium: 8.6 mg/dL — ABNORMAL LOW (ref 8.9–10.3)
Chloride: 107 mmol/L (ref 98–111)
Creatinine, Ser: 0.59 mg/dL — ABNORMAL LOW (ref 0.61–1.24)
GFR, Estimated: >60 mL/min (ref >60)
Glucose, Bld: 92 mg/dL (ref 70–99)
Potassium: 3.9 mmol/L (ref 3.5–5.1)
Sodium: 141 mmol/L (ref 135–145)
Total Bilirubin: 0.7 mg/dL (ref 0.0–1.2)
Total Protein: 5.4 g/dL — ABNORMAL LOW (ref 6.5–8.1)

## 2024-06-11 MED ORDER — DIPHENHYDRAMINE HCL 25 MG PO CAPS
25.0000 mg | ORAL_CAPSULE | Freq: Once | ORAL | Status: AC
  Administered 2024-06-11: 25 mg via ORAL
  Filled 2024-06-11: qty 1

## 2024-06-11 MED ORDER — DEXAMETHASONE 6 MG PO TABS
20.0000 mg | ORAL_TABLET | Freq: Once | ORAL | Status: AC
  Administered 2024-06-11: 20 mg via ORAL
  Filled 2024-06-11: qty 2

## 2024-06-11 MED ORDER — DARATUMUMAB-HYALURONIDASE-FIHJ 1800-30000 MG-UT/15ML ~~LOC~~ SOLN
1800.0000 mg | Freq: Once | SUBCUTANEOUS | Status: AC
  Administered 2024-06-11: 1800 mg via SUBCUTANEOUS
  Filled 2024-06-11: qty 15

## 2024-06-11 MED ORDER — DEXAMETHASONE 4 MG PO TABS: 20.0000 mg | ORAL_TABLET | ORAL | 1 refills | Status: AC

## 2024-06-11 MED ORDER — ACETAMINOPHEN 325 MG PO TABS
650.0000 mg | ORAL_TABLET | Freq: Once | ORAL | Status: AC
  Administered 2024-06-11: 650 mg via ORAL
  Filled 2024-06-11: qty 2

## 2024-06-11 NOTE — Telephone Encounter (Signed)
 I LVM informing Shedric of the time difference reflecting on his schedule for 10/29. I asked him to return my call if he needs to make any changes.

## 2024-06-11 NOTE — Progress Notes (Signed)
 [facility]   Telephone:(336) [phone] Fax:(336) [phone]   Clinic Follow up Note   Patient Care Team: [name], MD as PCP - General (Family Medicine)  Date of Service:  06/11/2024  CHIEF COMPLAINT: f/u of multiple myeloma  CURRENT THERAPY:  Daratumumab , dexamethasone  and Revlimid   Oncology History   Hypercalcemia of malignancy - Secondary to multiple myeloma -Patient received IV fluids, Zometa  and calcitonin, resolved after medical treatment.  Multiple myeloma (HCC) -IgG kappa type, stage I - Patient with admitted to the hospital on March 29, 2023 for hypercalcemia and diffuse lytic bone lesions on CT and MRI. -Bone marrow biopsy showed plasmacytoma.  Due to the sample limitation, the accurate percentage of plasma cell cannot be determined -Multiple myeloma showed IgG kappa monoclonal protein 1.1g/dl, elevated serum IgG and kappa light chain level with ratio 4.2.  Beta-2 microglobulin 3.0, LDH normal. -Based on the above lab results, diffuse bone lesions, hypercalcemia, mild renal insufficiency and the bone marrow biopsy, patient meets the criteria for multiple myeloma -I recommended induction chemotherapy Dara, Revlimid  and dexa, he started on 04/23/2024  Assessment & Plan Multiple myeloma not in remission Multiple myeloma is managed with Revlimid  and dexamethasone . He is on the last day of his cycle and will have a one-week break for blood count recovery. He reports feeling well with no new symptoms and tolerates dexamethasone  well, feeling better the next day after administration. - Continue Revlimid  as per cycle schedule - Administer dexamethasone  in the clinic on treatment days - Prescribe dexamethasone  for home use on non-treatment weeks - Monitor blood counts regularly - Schedule next appointment for November 12  Leukopenia secondary to chemotherapy Leukopenia is attributed to Revlimid  treatment. His white blood cell count is low, expected at this point  in the treatment cycle, with anticipated recovery during the one-week break from Revlimid . - Allow one-week break from Revlimid  to facilitate recovery of blood counts  Bradycardia Bradycardia with heart rates in the 40s to low 50s. He is asymptomatic with no chest discomfort or other symptoms. Carvedilol  was previously stopped at night due to low heart rate, and it is now recommended to discontinue it completely to assess heart rate response. - Discontinue carvedilol  - Monitor heart rate and blood pressure at home  Benign prostatic hyperplasia He reports nocturia and was prescribed tamsulosin by his primary care physician, experiencing improvement in symptoms. Tamsulosin can lower blood pressure, so monitoring is advised. - Continue tamsulosin as prescribed - Monitor blood pressure regularly  Plan - He is clinically doing very well, lab reviewed, neutropenia secondary to Revlimid , will proceed to daratumumab  injection in the continue weekly for 2 more weeks, then changed to every 2 weeks - I called in dexamethasone  to his pharmacy, he will take 20 mg weekly when he does not come in for daratumumab  injection - Follow-up in 1 month - Will stop carvedilol  due to bradycardia, he will monitor his heart rate and blood pressure at home   SUMMARY OF ONCOLOGIC HISTORY: Oncology History  Multiple myeloma (HCC)  04/08/2024 Initial Diagnosis   Multiple myeloma (HCC)   04/23/2024 - 04/23/2024 Chemotherapy   Patient is on Treatment Plan : MYELOMA NEWLY DIAGNOSED Daratumumab  IV + Lenalidomide  + Dexamethasone  Weekly (DaraRd) q28d     04/23/2024 -  Chemotherapy   Patient is on Treatment Plan : MYELOMA RELAPSED REFRACTORY Daratumumab  SQ + Lenalidomide  + Dexamethasone  (DaraRd) q28d        Discussed the use of AI scribe software for clinical note transcription with the  patient, who gave verbal consent to proceed.  History of Present Illness Joe [name] is a 73 year old male with multiple myeloma who  presents for follow-up.  He is undergoing treatment for multiple myeloma with Revlimid , currently on the last day of his cycle, with a week off before restarting. Dexamethasone  is taken weekly, improving symptoms the following day. He takes it at the clinic or at home if not visiting.  He experiences nocturia, urinating every hour at night, and started tamsulosin yesterday, which improved his sleep to a three and a half hour stretch.  His carvedilol  was recently stopped due to low heart rate, which he monitors at home, usually in the forties or low fifties, without chest discomfort or other symptoms. He is also on acyclovir , with a recent 90-day supply.     All other systems were reviewed with the patient and are negative.  MEDICAL HISTORY:  Past Medical History:  Diagnosis Date   Anxiety    Diabetes mellitus without complication (HCC)     SURGICAL HISTORY: No past surgical history on file.  I have reviewed the social history and family history with the patient and they are unchanged from previous note.  ALLERGIES:  has no known allergies.  MEDICATIONS:  Current Outpatient Medications  Medication Sig Dispense Refill   acyclovir  (ZOVIRAX ) 400 MG tablet Take 1 tablet (400 mg total) by mouth 2 (two) times daily. 180 tablet 1   aspirin EC 81 MG tablet Take 81 mg by mouth daily. Swallow whole.     calcium  citrate (CALCITRATE - DOSED IN MG ELEMENTAL CALCIUM ) 950 (200 Ca) MG tablet Take 600 mg of elemental calcium  by mouth 3 (three) times daily.     carvedilol  (COREG ) 6.25 MG tablet Take 6.25 mg by mouth 2 (two) times daily with a meal.     cholecalciferol (VITAMIN D3) 25 MCG (1000 UNIT) tablet Take 3,000 Units by mouth daily.     dexamethasone  (DECADRON ) 4 MG tablet Take 5 tablets (20 mg total) by mouth once a week. Take on the week on Wednesday without Dara injection 30 tablet 1   diazepam  (VALIUM ) 2 MG tablet Take 2 mg by mouth in the morning and at bedtime.     esomeprazole (NEXIUM)  40 MG capsule Take 40 mg by mouth daily before breakfast.     famotidine  (PEPCID ) 20 MG tablet Take 20 mg by mouth at bedtime.     FLONASE ALLERGY RELIEF 50 MCG/ACT nasal spray Place 1-2 sprays into both nostrils in the morning and at bedtime.     JARDIANCE 10 MG TABS tablet Take 10 mg by mouth daily.     lenalidomide  (REVLIMID ) 25 MG capsule Take 1 capsule (25 mg total) by mouth daily. For 21 days and off for 7 days for a 28 day cycle.  [employer] Auth # [id number]    Date Obtained 06/04/2024 21 capsule 0   Multiple Vitamin (MULTIVITAMIN) tablet Take 1 tablet by mouth daily with breakfast.     ondansetron  (ZOFRAN ) 8 MG tablet Take 1 tablet (8 mg total) by mouth every 8 (eight) hours as needed for nausea or vomiting. 30 tablet 1   ONETOUCH VERIO test strip      oxyCODONE  (OXY IR/ROXICODONE ) 5 MG immediate release tablet Take 1 tablet (5 mg total) by mouth every 8 (eight) hours as needed for severe pain (pain score 7-10). 60 tablet 0   rosuvastatin  (CRESTOR ) 10 MG tablet Take 10 mg by mouth daily.  sildenafil (VIAGRA) 100 MG tablet Take 100 mg by mouth daily as needed (for E.D.).     tamsulosin (FLOMAX) 0.4 MG CAPS capsule 1 capsule.     Turmeric (QC TUMERIC COMPLEX) 500 MG CAPS Take 4,800 capsules by mouth 2 (two) times daily.     [Paused] losartan (COZAAR) 100 MG tablet Take 50 mg by mouth daily.     No current facility-administered medications for this visit.   Facility-Administered Medications Ordered in Other Visits  Medication Dose Route Frequency Provider Last Rate Last Admin   acetaminophen  (TYLENOL ) tablet 650 mg  650 mg Oral Once Lanny Callander, MD       daratumumab -hyaluronidase -fihj (DARZALEX  FASPRO) 1800-30000 MG-UT/15ML chemo SQ injection 1,800 mg  1,800 mg Subcutaneous Once Lanny Callander, MD       dexamethasone  (DECADRON ) tablet 20 mg  20 mg Oral Once Lanny Callander, MD       diphenhydrAMINE  (BENADRYL ) capsule 25 mg  25 mg Oral Once Lanny Callander, MD        PHYSICAL EXAMINATION: ECOG PERFORMANCE  STATUS: 1 - Symptomatic but completely ambulatory  Vitals:   06/11/24 1004  BP: 110/64  Pulse: (!) 45  Resp: 16  Temp: 98.3 F (36.8 C)  SpO2: 99%   Wt Readings from Last 3 Encounters:  06/11/24 171 lb 8 oz (77.8 kg)  06/04/24 169 lb 9.6 oz (76.9 kg)  05/28/24 165 lb 12.8 oz (75.2 kg)     GENERAL:alert, no distress and comfortable SKIN: skin color, texture, turgor are normal, no rashes or significant lesions EYES: normal, Conjunctiva are pink and non-injected, sclera clear NECK: supple, thyroid  normal size, non-tender, without nodularity LYMPH:  no palpable lymphadenopathy in the cervical, axillary  LUNGS: clear to auscultation and percussion with normal breathing effort HEART: regular rate & rhythm and no murmurs and no lower extremity edema ABDOMEN:abdomen soft, non-tender and normal bowel sounds Musculoskeletal:no cyanosis of digits and no clubbing  NEURO: alert & oriented x 3 with fluent speech, no focal motor/sensory deficits  Physical Exam    LABORATORY DATA:  I have reviewed the data as listed    Latest Ref Rng & Units 06/11/2024    9:38 AM 06/04/2024    8:18 AM 05/28/2024    9:24 AM  CBC  WBC 4.0 - 10.5 K/uL 2.1  3.0  3.3   Hemoglobin 13.0 - 17.0 g/dL 87.8  87.3  86.9   Hematocrit 39.0 - 52.0 % 36.6  37.4  38.7   Platelets 150 - 400 K/uL 168  171  236         Latest Ref Rng & Units 06/11/2024    9:38 AM 05/28/2024    9:24 AM 05/14/2024    7:33 AM  CMP  Glucose 70 - 99 mg/dL 92  81  882   BUN 8 - 23 mg/dL 14  17  14    Creatinine 0.61 - 1.24 mg/dL 9.40  9.36  9.36   Sodium 135 - 145 mmol/L 141  140  143   Potassium 3.5 - 5.1 mmol/L 3.9  3.8  3.9   Chloride 98 - 111 mmol/L 107  106  109   CO2 22 - 32 mmol/L 31  30  30    Calcium  8.9 - 10.3 mg/dL 8.6  8.5  7.4   Total Protein 6.5 - 8.1 g/dL 5.4  6.1  5.6   Total Bilirubin 0.0 - 1.2 mg/dL 0.7  0.6  0.6   Alkaline Phos 38 - 126 U/L 138  238  290   AST 15 - 41 U/L 22  24  57   ALT 0 - 44 U/L 25  22  46        RADIOGRAPHIC STUDIES: I have personally reviewed the radiological images as listed and agreed with the findings in the report. No results found.    No orders of the defined types were placed in this encounter.  All questions were answered. The patient knows to call the clinic with any problems, questions or concerns. No barriers to learning was detected. The total time spent in the appointment was 25 minutes, including review of chart and various tests results, discussions about plan of care and coordination of care plan     Onita Mattock, MD 06/11/2024

## 2024-06-11 NOTE — Patient Instructions (Signed)
 [facility] - A DEPT OF [facility]  Discharge Instructions: Thank you for choosing [facility] to provide your oncology and hematology care.   If you have a lab appointment with the Cancer Center, please go directly to the Cancer Center and check in at the registration area.   Wear comfortable clothing and clothing appropriate for easy access to any Portacath or PICC line.   We strive to give you quality time with your provider. You may need to reschedule your appointment if you arrive late (15 or more minutes).  Arriving late affects you and other patients whose appointments are after yours.  Also, if you miss three or more appointments without notifying the office, you may be dismissed from the clinic at the provider's discretion.      For prescription refill requests, have your pharmacy contact our office and allow 72 hours for refills to be completed.    Today you received the following chemotherapy and/or immunotherapy agents: daratumumab-hyaluronidase-fihj (DARZALEX FASPRO       To help prevent nausea and vomiting after your treatment, we encourage you to take your nausea medication as directed.  BELOW ARE SYMPTOMS THAT SHOULD BE REPORTED IMMEDIATELY: *FEVER GREATER THAN 100.4 F (38 C) OR HIGHER *CHILLS OR SWEATING *NAUSEA AND VOMITING THAT IS NOT CONTROLLED WITH YOUR NAUSEA MEDICATION *UNUSUAL SHORTNESS OF BREATH *UNUSUAL BRUISING OR BLEEDING *URINARY PROBLEMS (pain or burning when urinating, or frequent urination) *BOWEL PROBLEMS (unusual diarrhea, constipation, pain near the anus) TENDERNESS IN MOUTH AND THROAT WITH OR WITHOUT PRESENCE OF ULCERS (sore throat, sores in mouth, or a toothache) UNUSUAL RASH, SWELLING OR PAIN  UNUSUAL VAGINAL DISCHARGE OR ITCHING   Items with * indicate a potential emergency and should be followed up as soon as possible or go to the Emergency Department if any problems should occur.  Please show the  CHEMOTHERAPY ALERT CARD or IMMUNOTHERAPY ALERT CARD at check-in to the Emergency Department and triage nurse.  Should you have questions after your visit or need to cancel or reschedule your appointment, please contact [facility] - A DEPT OF [facility]  Dept: [phone]  and follow the prompts.  Office hours are 8:00 a.m. to 4:30 p.m. Monday - Friday. Please note that voicemails left after 4:00 p.m. may not be returned until the following business day.  We are closed weekends and major holidays. You have access to a nurse at all times for urgent questions. Please call the main number to the clinic Dept: [phone] and follow the prompts.   For any non-urgent questions, you may also contact your provider using MyChart. We now offer e-Visits for anyone 63 and older to request care online for non-urgent symptoms. For details visit [email or web address].   Also download the MyChart app! Go to the app store, search "MyChart", open the app, select [facility], and log in with your MyChart username and password.

## 2024-06-12 ENCOUNTER — Encounter: Payer: Self-pay | Admitting: Family Medicine

## 2024-06-17 ENCOUNTER — Ambulatory Visit: Admitting: Hematology

## 2024-06-17 ENCOUNTER — Inpatient Hospital Stay

## 2024-06-17 ENCOUNTER — Inpatient Hospital Stay (HOSPITAL_BASED_OUTPATIENT_CLINIC_OR_DEPARTMENT_OTHER): Admitting: Nurse Practitioner

## 2024-06-17 ENCOUNTER — Encounter: Payer: Self-pay | Admitting: Nurse Practitioner

## 2024-06-17 VITALS — BP 137/64 | HR 70 | Temp 98.2°F | Resp 18 | Ht 67.0 in

## 2024-06-17 DIAGNOSIS — R530 Neoplastic (malignant) related fatigue: Secondary | ICD-10-CM

## 2024-06-17 DIAGNOSIS — D701 Agranulocytosis secondary to cancer chemotherapy: Secondary | ICD-10-CM | POA: Diagnosis not present

## 2024-06-17 DIAGNOSIS — G893 Neoplasm related pain (acute) (chronic): Secondary | ICD-10-CM | POA: Diagnosis not present

## 2024-06-17 DIAGNOSIS — C9000 Multiple myeloma not having achieved remission: Secondary | ICD-10-CM

## 2024-06-17 DIAGNOSIS — C9002 Multiple myeloma in relapse: Secondary | ICD-10-CM | POA: Diagnosis not present

## 2024-06-17 DIAGNOSIS — Z79620 Long term (current) use of immunosuppressive biologic: Secondary | ICD-10-CM | POA: Diagnosis not present

## 2024-06-17 DIAGNOSIS — Z515 Encounter for palliative care: Secondary | ICD-10-CM | POA: Diagnosis not present

## 2024-06-17 DIAGNOSIS — T451X5A Adverse effect of antineoplastic and immunosuppressive drugs, initial encounter: Secondary | ICD-10-CM | POA: Diagnosis not present

## 2024-06-17 DIAGNOSIS — Z5112 Encounter for antineoplastic immunotherapy: Secondary | ICD-10-CM | POA: Diagnosis not present

## 2024-06-17 DIAGNOSIS — R001 Bradycardia, unspecified: Secondary | ICD-10-CM | POA: Diagnosis not present

## 2024-06-17 LAB — COMPREHENSIVE METABOLIC PANEL WITH GFR
ALT: 23 U/L (ref 0–44)
AST: 20 U/L (ref 15–41)
Albumin: 3.6 g/dL (ref 3.5–5.0)
Alkaline Phosphatase: 122 U/L (ref 38–126)
Anion gap: 6 (ref 5–15)
BUN: 19 mg/dL (ref 8–23)
CO2: 27 mmol/L (ref 22–32)
Calcium: 9 mg/dL (ref 8.9–10.3)
Chloride: 108 mmol/L (ref 98–111)
Creatinine, Ser: 0.61 mg/dL (ref 0.61–1.24)
GFR, Estimated: >60 mL/min (ref >60)
Glucose, Bld: 91 mg/dL (ref 70–99)
Potassium: 4.6 mmol/L (ref 3.5–5.1)
Sodium: 141 mmol/L (ref 135–145)
Total Bilirubin: 0.7 mg/dL (ref 0.0–1.2)
Total Protein: 5.7 g/dL — ABNORMAL LOW (ref 6.5–8.1)

## 2024-06-17 LAB — CBC WITH DIFFERENTIAL (CANCER CENTER ONLY)
Abs Immature Granulocytes: 0 K/uL (ref 0.00–0.07)
Basophils Absolute: 0 K/uL (ref 0.0–0.1)
Basophils Relative: 1 %
Eosinophils Absolute: 0.2 K/uL (ref 0.0–0.5)
Eosinophils Relative: 7 %
HCT: 37.7 % — ABNORMAL LOW (ref 39.0–52.0)
Hemoglobin: 12.5 g/dL — ABNORMAL LOW (ref 13.0–17.0)
Immature Granulocytes: 0 %
Lymphocytes Relative: 26 %
Lymphs Abs: 0.7 K/uL (ref 0.7–4.0)
MCH: 32.2 pg (ref 26.0–34.0)
MCHC: 33.2 g/dL (ref 30.0–36.0)
MCV: 97.2 fL (ref 80.0–100.0)
Monocytes Absolute: 0.4 K/uL (ref 0.1–1.0)
Monocytes Relative: 13 %
Neutro Abs: 1.5 K/uL — ABNORMAL LOW (ref 1.7–7.7)
Neutrophils Relative %: 53 %
Platelet Count: 208 K/uL (ref 150–400)
RBC: 3.88 MIL/uL — ABNORMAL LOW (ref 4.22–5.81)
RDW: 14.6 % (ref 11.5–15.5)
WBC Count: 2.9 K/uL — ABNORMAL LOW (ref 4.0–10.5)
nRBC: 0 % (ref 0.0–0.2)

## 2024-06-17 MED ORDER — DIPHENHYDRAMINE HCL 25 MG PO CAPS
25.0000 mg | ORAL_CAPSULE | Freq: Once | ORAL | Status: AC
  Administered 2024-06-17: 25 mg via ORAL
  Filled 2024-06-17: qty 1

## 2024-06-17 MED ORDER — DARATUMUMAB-HYALURONIDASE-FIHJ 1800-30000 MG-UT/15ML ~~LOC~~ SOLN
1800.0000 mg | Freq: Once | SUBCUTANEOUS | Status: AC
  Administered 2024-06-17: 1800 mg via SUBCUTANEOUS
  Filled 2024-06-17: qty 15

## 2024-06-17 MED ORDER — ACETAMINOPHEN 325 MG PO TABS
650.0000 mg | ORAL_TABLET | Freq: Once | ORAL | Status: AC
  Administered 2024-06-17: 650 mg via ORAL
  Filled 2024-06-17: qty 2

## 2024-06-17 MED ORDER — DEXAMETHASONE 6 MG PO TABS
20.0000 mg | ORAL_TABLET | Freq: Once | ORAL | Status: AC
  Administered 2024-06-17: 20 mg via ORAL
  Filled 2024-06-17: qty 2

## 2024-06-17 NOTE — Patient Instructions (Signed)
 [facility] - A DEPT OF [facility]  Discharge Instructions: Thank you for choosing [facility] to provide your oncology and hematology care.   If you have a lab appointment with the Cancer Center, please go directly to the Cancer Center and check in at the registration area.   Wear comfortable clothing and clothing appropriate for easy access to any Portacath or PICC line.   We strive to give you quality time with your provider. You may need to reschedule your appointment if you arrive late (15 or more minutes).  Arriving late affects you and other patients whose appointments are after yours.  Also, if you miss three or more appointments without notifying the office, you may be dismissed from the clinic at the provider's discretion.      For prescription refill requests, have your pharmacy contact our office and allow 72 hours for refills to be completed.    Today you received the following chemotherapy and/or immunotherapy agents: daratumumab-hyaluronidase-fihj (DARZALEX FASPRO       To help prevent nausea and vomiting after your treatment, we encourage you to take your nausea medication as directed.  BELOW ARE SYMPTOMS THAT SHOULD BE REPORTED IMMEDIATELY: *FEVER GREATER THAN 100.4 F (38 C) OR HIGHER *CHILLS OR SWEATING *NAUSEA AND VOMITING THAT IS NOT CONTROLLED WITH YOUR NAUSEA MEDICATION *UNUSUAL SHORTNESS OF BREATH *UNUSUAL BRUISING OR BLEEDING *URINARY PROBLEMS (pain or burning when urinating, or frequent urination) *BOWEL PROBLEMS (unusual diarrhea, constipation, pain near the anus) TENDERNESS IN MOUTH AND THROAT WITH OR WITHOUT PRESENCE OF ULCERS (sore throat, sores in mouth, or a toothache) UNUSUAL RASH, SWELLING OR PAIN  UNUSUAL VAGINAL DISCHARGE OR ITCHING   Items with * indicate a potential emergency and should be followed up as soon as possible or go to the Emergency Department if any problems should occur.  Please show the  CHEMOTHERAPY ALERT CARD or IMMUNOTHERAPY ALERT CARD at check-in to the Emergency Department and triage nurse.  Should you have questions after your visit or need to cancel or reschedule your appointment, please contact [facility] - A DEPT OF [facility]  Dept: [phone]  and follow the prompts.  Office hours are 8:00 a.m. to 4:30 p.m. Monday - Friday. Please note that voicemails left after 4:00 p.m. may not be returned until the following business day.  We are closed weekends and major holidays. You have access to a nurse at all times for urgent questions. Please call the main number to the clinic Dept: [phone] and follow the prompts.   For any non-urgent questions, you may also contact your provider using MyChart. We now offer e-Visits for anyone 63 and older to request care online for non-urgent symptoms. For details visit [email or web address].   Also download the MyChart app! Go to the app store, search "MyChart", open the app, select [facility], and log in with your MyChart username and password.

## 2024-06-17 NOTE — Progress Notes (Signed)
 Palliative Medicine [facility]  [phone] [phone]   Name: Joe [name] Date: 06/17/2024 MRN: [id number]  DOB: [birth year]  Patient Care Team: [name], MD as PCP - General (Family Medicine)    INTERVAL HISTORY: Joe [name] is a 73 y.o. male with oncologic medical history including multiple myeloma currently undergoing treatment with Revlimid , dexamethasone  and daratumumab  .  Palliative is seeing patient for symptom management and goals of care.   SOCIAL HISTORY:     reports that he has never smoked. He does not have any smokeless tobacco history on file. He reports that he does not drink alcohol and does not use drugs.  ADVANCE DIRECTIVES:  Patient has a documented directive. Reports his daughter, [name] and niece Joe [name] is his medical decision make   CODE STATUS: Full code  PAST MEDICAL HISTORY: Past Medical History:  Diagnosis Date   Anxiety    Diabetes mellitus without complication (HCC)     ALLERGIES:  has no known allergies.  MEDICATIONS:  Current Outpatient Medications  Medication Sig Dispense Refill   acyclovir  (ZOVIRAX ) 400 MG tablet Take 1 tablet (400 mg total) by mouth 2 (two) times daily. 180 tablet 1   aspirin EC 81 MG tablet Take 81 mg by mouth daily. Swallow whole.     calcium  citrate (CALCITRATE - DOSED IN MG ELEMENTAL CALCIUM ) 950 (200 Ca) MG tablet Take 600 mg of elemental calcium  by mouth 3 (three) times daily.     cholecalciferol (VITAMIN D3) 25 MCG (1000 UNIT) tablet Take 3,000 Units by mouth daily.     dexamethasone  (DECADRON ) 4 MG tablet Take 5 tablets (20 mg total) by mouth once a week. Take on the week on Wednesday without Dara injection 30 tablet 1   diazepam  (VALIUM ) 2 MG tablet Take 2 mg by mouth in the morning and at bedtime.     esomeprazole (NEXIUM) 40 MG capsule Take 40 mg by mouth daily before breakfast.     famotidine  (PEPCID ) 20 MG tablet Take 20 mg by mouth at bedtime.     FLONASE  ALLERGY RELIEF 50 MCG/ACT nasal spray Place 1-2 sprays into both nostrils in the morning and at bedtime.     JARDIANCE 10 MG TABS tablet Take 10 mg by mouth daily.     lenalidomide  (REVLIMID ) 25 MG capsule Take 1 capsule (25 mg total) by mouth daily. For 21 days and off for 7 days for a 28 day cycle.  [employer] Auth # [id number]    Date Obtained 06/04/2024 21 capsule 0   Multiple Vitamin (MULTIVITAMIN) tablet Take 1 tablet by mouth daily with breakfast.     ondansetron  (ZOFRAN ) 8 MG tablet Take 1 tablet (8 mg total) by mouth every 8 (eight) hours as needed for nausea or vomiting. 30 tablet 1   ONETOUCH VERIO test strip      oxyCODONE  (OXY IR/ROXICODONE ) 5 MG immediate release tablet Take 1 tablet (5 mg total) by mouth every 8 (eight) hours as needed for severe pain (pain score 7-10). 60 tablet 0   rosuvastatin  (CRESTOR ) 10 MG tablet Take 10 mg by mouth daily.     sildenafil (VIAGRA) 100 MG tablet Take 100 mg by mouth daily as needed (for E.D.).     tamsulosin (FLOMAX) 0.4 MG CAPS capsule 1 capsule.     Turmeric (QC TUMERIC COMPLEX) 500 MG CAPS Take 4,800 capsules by mouth 2 (two) times daily.     No current facility-administered medications for this visit.  VITAL SIGNS: BP 137/64 (BP Location: Left Arm, Patient Position: Sitting)   Pulse 70   Temp 98.2 F (36.8 C) (Temporal)   Resp 18   Ht 5' 7 (1.702 m)   SpO2 100%   BMI 26.86 kg/m  There were no vitals filed for this visit.  Estimated body mass index is 26.86 kg/m as calculated from the following:   Height as of this encounter: 5' 7 (1.702 m).   Weight as of 06/11/24: 171 lb 8 oz (77.8 kg).   PERFORMANCE STATUS (ECOG) : 1 - Symptomatic but completely ambulatory  Physical Exam General: NAD Cardiovascular: regular rate and rhythm Pulmonary: normal breathing pattern Extremities: right lower extremity trace edema, no joint deformities Skin: no rashes Neurological: AAO x3  IMPRESSION: Discussed the use of AI scribe software  for clinical note transcription with the patient, who gave verbal consent to proceed.  History of Present Illness Joe [name] is a 73 year old male with Multiple Myeloma who presents for symptom management follow-up.[name] He is accompanied by his sister, Joe [name]. No acute distress noted. Denies uncontrolled nausea, vomiting, constipation, or diarrhea. Last night, he experienced an isolated episode of vomiting and diarrhea after drinking a large glass of cold water. He feels well today.  He has experienced a recent increase in appetite, leading to a weight gain from 169 pounds on October 8th to 173 pounds today. He has since moderated his eating habits. He has been experiencing increased thirst, consuming large amounts of orange juice and soda, but has recently reduced his intake of sugary drinks and increased water consumption.  Patient mentions occasional swelling in his right foot, which he managed by soaking it, resulting in reduced swelling. The swelling is currently minimal.  Encouraged to continue close monitoring and contact office if worsens.  Joe [name] reports a consistently low heart rate, with a recent reading of 57 beats per minute. He is not currently taking carvedilol  or losartan. No lightheadedness or dizziness, though he occasionally loses his balance slightly.  States recently at PCP office heart rate was within normal limits with no concern.  I personally manually rechecked heart rate during visit resulting in 70 bpm.  He was prescribed a medication for frequent urination, which he discontinued due to adverse effects.  He is unsure of the name of the medication.  He now urinates every three to four hours and feels well without the medication.  Joe [name] reports minimal pain, only taking pain medication occasionally to aid sleep, having taken two or three pills in the past week.  No adjustments to current regimen at this time.  He has oxycodone  5 mg every 8 hours as needed on hand.  Goals of  Care We discussed Joe [name] current illness and what it means in the larger context of his on-going co-morbidities. Natural disease trajectory and expectations were discussed.   Patient and his family are realistic in their understanding. He is clear in expressed wishes to continue to treat the treatable allow him every opportunity to continue to thrive while managing his symptoms and minimizing any discomfort or distress.   Patient confirms he has a completed advanced directive in the home naming his daughter [name] and his niece Joe [name] as his medical decision-maker in the event he is unable to speak for himself.  Joe [name] would not want his life prolonged in the event of a terminal illness, irreversible causes, or vegetative state.  I encouraged patient to bring in his advance directive to have on  file.   I discussed the importance of continued conversation with family and their medical providers regarding overall plan of care and treatment options, ensuring decisions are within the context of the patients values and GOCs. Assessment & Plan Bradycardia Heart rate consistently low, ranging from 44 to 57 bpm, with no symptoms of lightheadedness or dizziness. Heart rate was 75 bpm during recent PCP visit. Not currently on carvedilol  or losartan, which were previously discontinued.  Manually rechecked during visit at 70 bpm. - Monitor heart rate regularly - Advise to avoid rapid postural changes - Instruct to report any persistent lightheadedness or dizziness  Right foot localized edema Intermittent localized edema in the right foot, decreased after soaking. Currently, only slight swelling present.  Appetite Weight increased from 169 lbs on October 8 to 173 lbs. Appetite good with dietary changes to reduce fluid and sugar intake. No concerns about weight gain. - Encourage balanced diet and moderation in fluid intake  Urinary frequency (improved) Previously experienced urinary frequency, with  nocturia every hour. Discontinued medication due to adverse effects. Currently improved, with 3-4 hours between voids at night. - Use medication as needed based on symptoms  Follow-up I will plan to see patient back in 2-3 weeks.  Sooner if needed.  Patient expressed understanding and was in agreement with this plan. He also understands that He can call the clinic at any time with any questions, concerns, or complaints.   Any controlled substances utilized were prescribed in the context of palliative care. PDMP has been reviewed.   Visit consisted of counseling and education dealing with the complex and emotionally intense issues of symptom management and palliative care in the setting of serious and potentially life-threatening illness.  Levon Borer, AGPCNP-BC  Palliative Medicine Team/[facility]

## 2024-06-24 NOTE — Assessment & Plan Note (Signed)
-  IgG kappa type, stage I - Patient with admitted to the hospital on March 29, 2023 for hypercalcemia and diffuse lytic bone lesions on CT and MRI. -Bone marrow biopsy showed plasmacytoma.  Due to the sample limitation, the accurate percentage of plasma cell cannot be determined -Multiple myeloma showed IgG kappa monoclonal protein 1.1g/dl, elevated serum IgG and kappa light chain level with ratio 4.2.  Beta-2 microglobulin 3.0, LDH normal. -Based on the above lab results, diffuse bone lesions, hypercalcemia, mild renal insufficiency and the bone marrow biopsy, patient meets the criteria for multiple myeloma -Induction chemotherapy was recommended with Dara, Revlimid  and dexa, he started on 04/23/2024 - 06/25/2024 -proceed with cycle 2 Day 1 chemotherapy with daratumumab , Revlimid , dexamethasone  every 2 weeks.

## 2024-06-24 NOTE — Progress Notes (Unsigned)
 Patient Care Team: [name], MD as PCP - General (Family Medicine)  Clinic Day:  06/25/2024  Referring physician: [name], MD  ASSESSMENT & PLAN:   Assessment & Plan: Multiple myeloma (HCC) -IgG kappa type, stage I - Patient with admitted to the hospital on March 29, 2023 for hypercalcemia and diffuse lytic bone lesions on CT and MRI. -Bone marrow biopsy showed plasmacytoma.  Due to the sample limitation, the accurate percentage of plasma cell cannot be determined -Multiple myeloma showed IgG kappa monoclonal protein 1.1g/dl, elevated serum IgG and kappa light chain level with ratio 4.2.  Beta-2 microglobulin 3.0, LDH normal. -Based on the above lab results, diffuse bone lesions, hypercalcemia, mild renal insufficiency and the bone marrow biopsy, patient meets the criteria for multiple myeloma -Induction chemotherapy was recommended with Dara, Revlimid  and dexa, he started on 04/23/2024 - 06/25/2024 -proceed with cycle 2 Day 1 chemotherapy with daratumumab , Revlimid , dexamethasone  every 2 weeks.    Leukopenia Secondary to treatment with Revlimid . Improved since last week.  Today, WBC 2.8 with ANC 1.9.  Continue to monitor closely and adjust treatment as indicated.  Multiple myeloma Well managed with current treatment of Revlimid , dexamethasone , and daratumumab .  Today, treatments will not be every 2 weeks.  Reviewed most recent myeloma panel and light chains  done on 05/20/2024.  He had normal free light chains panel.  IgG was 523, down from 1753 at previous check.  M protein spike was not observed.  New myeloma panel and free light chains were drawn during today's visit.  Expect results to be available in approximately 1 week.   Bradycardia Carvedilol  discontinued on 06/11/2024.  Patient monitoring pulse at home.  Runs between 50 and 70 routinely.  Manual heart rate checked today at 52.  Occasional PVCs are auscultated.  He is asymptomatic.  He denies chest discomfort or other  symptoms.  He will continue to monitor his heart rate and blood pressure at home.  Plan Labs reviewed. -Improved leukopenia with WBC 2.8 and ANC 1.9.  This is secondary to Revlimid . - New myeloma and free light chains panels drawn today. Labs and patient presentation are appropriate for treatment today.  Will proceed for cycle 2 day 1 of daratumumab , dexamethasone , and Revlimid . Will receive IV infusion Zometa  today and continue every 12 weeks. Labs, follow-up, and cycle 2 day 15 chemotherapy Darzalex , Revlimid , and dexamethasone , as scheduled on 07/09/2024.  the patient understands the plans discussed today and is in agreement with them.  He knows to contact our office if he develops concerns prior to his next appointment.  I provided 25 minutes of face-to-face time during this encounter and > 50% was spent counseling as documented under my assessment and plan.    Powell FORBES Lessen, NP  [facility] [facility] - A DEPT OF [facility] [address] [place] [address] Dept: [phone] Dept Fax: [phone]   No orders of the defined types were placed in this encounter.     CHIEF COMPLAINT:  CC: Multiple myeloma  Current Treatment: Daratumumab , dexamethasone , and Revlimid   INTERVAL HISTORY:  Juvenal is here today for repeat clinical assessment.  He was last seen by Dr. Lanny on 06/11/2024.  He has had hypercalcemia secondary to his multiple myeloma.  Today, he presents for cycle 3 day 1 treatment with daratumumab , dexamethasone , and Revlimid .  Today, he reports feeling very well.  He states he is doing better getting around the house.  He reports some decreased strength.  Does take him some time to get from seated position to standing.  Notices some soreness around the lower back.  Moving around and walking definitely helps.  He does get fatigued more quickly than he used to.  Mostly during times of increased exertion.  Will rest  when he needs to.  He has also noted some changes in his taste states he often needs increased amounts of salt for food to taste good.  Continues to have mildly low pulse, even after discontinuation of carvedilol .  He denies chest pain, chest pressure, or shortness of breath. He denies headaches or visual disturbances. He denies abdominal pain, nausea, vomiting, or changes in bowel or bladder habits. He denies fevers or chills. He does have some pain and soreness, mostly around the low back and after being still for short period of time.  He is he is palliative care to help with management of chronic pain associated with multiple myeloma.. His appetite is good. His weight has decreased 2 pounds over last week.  I have reviewed the past medical history, past surgical history, social history and family history with the patient and they are unchanged from previous note.  ALLERGIES:  has no known allergies.  MEDICATIONS:  Current Outpatient Medications  Medication Sig Dispense Refill   acyclovir  (ZOVIRAX ) 400 MG tablet Take 1 tablet (400 mg total) by mouth 2 (two) times daily. 180 tablet 1   aspirin EC 81 MG tablet Take 81 mg by mouth daily. Swallow whole.     calcium  citrate (CALCITRATE - DOSED IN MG ELEMENTAL CALCIUM ) 950 (200 Ca) MG tablet Take 600 mg of elemental calcium  by mouth 3 (three) times daily.     cholecalciferol (VITAMIN D3) 25 MCG (1000 UNIT) tablet Take 3,000 Units by mouth daily.     dexamethasone  (DECADRON ) 4 MG tablet Take 5 tablets (20 mg total) by mouth once a week. Take on the week on Wednesday without Dara injection 30 tablet 1   diazepam  (VALIUM ) 2 MG tablet Take 2 mg by mouth in the morning and at bedtime.     esomeprazole (NEXIUM) 40 MG capsule Take 40 mg by mouth daily before breakfast.     famotidine  (PEPCID ) 20 MG tablet Take 20 mg by mouth at bedtime.     FLONASE ALLERGY RELIEF 50 MCG/ACT nasal spray Place 1-2 sprays into both nostrils in the morning and at bedtime.      JARDIANCE 10 MG TABS tablet Take 10 mg by mouth daily.     lenalidomide  (REVLIMID ) 25 MG capsule Take 1 capsule (25 mg total) by mouth daily. For 21 days and off for 7 days for a 28 day cycle.  [employer] Auth # [id number]    Date Obtained 06/04/2024 21 capsule 0   Multiple Vitamin (MULTIVITAMIN) tablet Take 1 tablet by mouth daily with breakfast.     ondansetron  (ZOFRAN ) 8 MG tablet Take 1 tablet (8 mg total) by mouth every 8 (eight) hours as needed for nausea or vomiting. 30 tablet 1   ONETOUCH VERIO test strip      oxyCODONE  (OXY IR/ROXICODONE ) 5 MG immediate release tablet Take 1 tablet (5 mg total) by mouth every 8 (eight) hours as needed for severe pain (pain score 7-10). 60 tablet 0   rosuvastatin  (CRESTOR ) 10 MG tablet Take 10 mg by mouth daily.     sildenafil (VIAGRA) 100 MG tablet Take 100 mg by mouth daily as needed (for E.D.).     tamsulosin (  FLOMAX) 0.4 MG CAPS capsule 1 capsule.     Turmeric (QC TUMERIC COMPLEX) 500 MG CAPS Take 4,800 capsules by mouth 2 (two) times daily.     No current facility-administered medications for this visit.   Facility-Administered Medications Ordered in Other Visits  Medication Dose Route Frequency Provider Last Rate Last Admin   0.9 %  sodium chloride  infusion   Intravenous Continuous Lanny Callander, MD       daratumumab -hyaluronidase -fihj (DARZALEX  FASPRO) 1800-30000 MG-UT/15ML chemo SQ injection 1,800 mg  1,800 mg Subcutaneous Once Lanny Callander, MD       Zoledronic  Acid (ZOMETA ) IVPB 4 mg  4 mg Intravenous Once Lanny Callander, MD        HISTORY OF PRESENT ILLNESS:   Oncology History  Multiple myeloma (HCC)  04/08/2024 Initial Diagnosis   Multiple myeloma (HCC)   04/23/2024 - 04/23/2024 Chemotherapy   Patient is on Treatment Plan : MYELOMA NEWLY DIAGNOSED Daratumumab  IV + Lenalidomide  + Dexamethasone  Weekly (DaraRd) q28d     04/23/2024 -  Chemotherapy   Patient is on Treatment Plan : MYELOMA RELAPSED REFRACTORY Daratumumab  SQ + Lenalidomide  + Dexamethasone   (DaraRd) q28d         REVIEW OF SYSTEMS:   Constitutional: Denies fevers, chills or abnormal weight loss Eyes: Denies blurriness of vision Ears, nose, mouth, throat, and face: Denies mucositis or sore throat Respiratory: Denies cough, dyspnea or wheezes Cardiovascular: Denies palpitation, chest discomfort or lower extremity swelling. Mild bradycardia.  Gastrointestinal:  Denies nausea, heartburn or change in bowel habits Skin: Denies abnormal skin rashes Lymphatics: Denies new lymphadenopathy or easy bruising Neurological:Denies numbness, tingling or new weaknesses Behavioral/Psych: Mood is stable, no new changes  All other systems were reviewed with the patient and are negative.   VITALS:   Today's Vitals   06/25/24 0859  BP: (!) 147/61  Pulse: (!) 52  Resp: 18  Temp: (!) 97.2 F (36.2 C)  SpO2: 99%  Weight: 169 lb 11.2 oz (77 kg)  PainSc: 0-No pain   Body mass index is 26.58 kg/m.   Wt Readings from Last 3 Encounters:  06/25/24 169 lb 7 oz (76.9 kg)  06/25/24 169 lb 11.2 oz (77 kg)  06/11/24 171 lb 8 oz (77.8 kg)    Body mass index is 26.58 kg/m.  Performance status (ECOG): 1 - Symptomatic but completely ambulatory  PHYSICAL EXAM:   GENERAL:alert, no distress and comfortable SKIN: skin color, texture, turgor are normal, no rashes or significant lesions EYES: normal, Conjunctiva are pink and non-injected, sclera clear OROPHARYNX:no exudate, no erythema and lips, buccal mucosa, and tongue normal  NECK: supple, thyroid  normal size, non-tender, without nodularity LYMPH:  no palpable lymphadenopathy in the cervical, axillary or inguinal LUNGS: clear to auscultation and percussion with normal breathing effort HEART: Mild bradycardia with slightly irregular rhythm.  Suspect intermittent PVCs. ABDOMEN:abdomen soft, non-tender and normal bowel sounds Musculoskeletal:no cyanosis of digits and no clubbing  NEURO: alert & oriented x 3 with fluent speech, no focal  motor/sensory deficits  LABORATORY DATA:  I have reviewed the data as listed    Component Value Date/Time   NA 141 06/17/2024 1241   K 4.6 06/17/2024 1241   CL 108 06/17/2024 1241   CO2 27 06/17/2024 1241   GLUCOSE 91 06/17/2024 1241   BUN 19 06/17/2024 1241   CREATININE 0.61 06/17/2024 1241   CALCIUM  9.0 06/17/2024 1241   PROT 5.7 (L) 06/17/2024 1241   ALBUMIN 3.6 06/17/2024 1241   AST 20 06/17/2024 1241  ALT 23 06/17/2024 1241   ALKPHOS 122 06/17/2024 1241   BILITOT 0.7 06/17/2024 1241   GFRNONAA >60 06/17/2024 1241    Lab Results  Component Value Date   WBC 2.8 (L) 06/25/2024   NEUTROABS 1.9 06/25/2024   HGB 13.0 06/25/2024   HCT 38.6 (L) 06/25/2024   MCV 96.0 06/25/2024   PLT 162 06/25/2024

## 2024-06-25 ENCOUNTER — Inpatient Hospital Stay: Admitting: Nurse Practitioner

## 2024-06-25 ENCOUNTER — Inpatient Hospital Stay

## 2024-06-25 ENCOUNTER — Other Ambulatory Visit

## 2024-06-25 ENCOUNTER — Encounter: Payer: Self-pay | Admitting: Nurse Practitioner

## 2024-06-25 VITALS — BP 147/61 | HR 52 | Temp 97.9°F | Resp 18 | Wt 169.4 lb

## 2024-06-25 VITALS — BP 147/61 | HR 52 | Temp 97.2°F | Resp 18 | Wt 169.7 lb

## 2024-06-25 DIAGNOSIS — Z79620 Long term (current) use of immunosuppressive biologic: Secondary | ICD-10-CM | POA: Diagnosis not present

## 2024-06-25 DIAGNOSIS — Z5112 Encounter for antineoplastic immunotherapy: Secondary | ICD-10-CM | POA: Diagnosis not present

## 2024-06-25 DIAGNOSIS — C9000 Multiple myeloma not having achieved remission: Secondary | ICD-10-CM

## 2024-06-25 DIAGNOSIS — C9002 Multiple myeloma in relapse: Secondary | ICD-10-CM | POA: Diagnosis not present

## 2024-06-25 DIAGNOSIS — D701 Agranulocytosis secondary to cancer chemotherapy: Secondary | ICD-10-CM | POA: Diagnosis not present

## 2024-06-25 DIAGNOSIS — T451X5A Adverse effect of antineoplastic and immunosuppressive drugs, initial encounter: Secondary | ICD-10-CM | POA: Diagnosis not present

## 2024-06-25 DIAGNOSIS — R001 Bradycardia, unspecified: Secondary | ICD-10-CM | POA: Diagnosis not present

## 2024-06-25 LAB — CBC WITH DIFFERENTIAL (CANCER CENTER ONLY)
Abs Immature Granulocytes: 0 K/uL (ref 0.00–0.07)
Basophils Absolute: 0 K/uL (ref 0.0–0.1)
Basophils Relative: 1 %
Eosinophils Absolute: 0.2 K/uL (ref 0.0–0.5)
Eosinophils Relative: 8 %
HCT: 38.6 % — ABNORMAL LOW (ref 39.0–52.0)
Hemoglobin: 13 g/dL (ref 13.0–17.0)
Immature Granulocytes: 0 %
Lymphocytes Relative: 16 %
Lymphs Abs: 0.5 K/uL — ABNORMAL LOW (ref 0.7–4.0)
MCH: 32.3 pg (ref 26.0–34.0)
MCHC: 33.7 g/dL (ref 30.0–36.0)
MCV: 96 fL (ref 80.0–100.0)
Monocytes Absolute: 0.1 K/uL (ref 0.1–1.0)
Monocytes Relative: 5 %
Neutro Abs: 1.9 K/uL (ref 1.7–7.7)
Neutrophils Relative %: 70 %
Platelet Count: 162 K/uL (ref 150–400)
RBC: 4.02 MIL/uL — ABNORMAL LOW (ref 4.22–5.81)
RDW: 14.6 % (ref 11.5–15.5)
WBC Count: 2.8 K/uL — ABNORMAL LOW (ref 4.0–10.5)
nRBC: 0 % (ref 0.0–0.2)

## 2024-06-25 MED ORDER — SODIUM CHLORIDE 0.9 % IV SOLN: INTRAVENOUS | Status: DC

## 2024-06-25 MED ORDER — DARATUMUMAB-HYALURONIDASE-FIHJ 1800-30000 MG-UT/15ML ~~LOC~~ SOLN
1800.0000 mg | Freq: Once | SUBCUTANEOUS | Status: AC
  Administered 2024-06-25: 1800 mg via SUBCUTANEOUS
  Filled 2024-06-25: qty 15

## 2024-06-25 MED ORDER — DEXAMETHASONE 6 MG PO TABS
20.0000 mg | ORAL_TABLET | Freq: Once | ORAL | Status: AC
  Administered 2024-06-25: 20 mg via ORAL
  Filled 2024-06-25: qty 2

## 2024-06-25 MED ORDER — ZOLEDRONIC ACID 4 MG/100ML IV SOLN
4.0000 mg | Freq: Once | INTRAVENOUS | Status: AC
  Administered 2024-06-25: 4 mg via INTRAVENOUS
  Filled 2024-06-25: qty 100

## 2024-06-25 MED ORDER — ACETAMINOPHEN 325 MG PO TABS
650.0000 mg | ORAL_TABLET | Freq: Once | ORAL | Status: AC
  Administered 2024-06-25: 650 mg via ORAL
  Filled 2024-06-25: qty 2

## 2024-06-25 MED ORDER — DIPHENHYDRAMINE HCL 25 MG PO CAPS
25.0000 mg | ORAL_CAPSULE | Freq: Once | ORAL | Status: AC
  Administered 2024-06-25: 25 mg via ORAL
  Filled 2024-06-25: qty 1

## 2024-06-25 NOTE — Patient Instructions (Signed)
 [facility] - A DEPT OF [facility]  Discharge Instructions: Thank you for choosing [facility] to provide your oncology and hematology care.   If you have a lab appointment with the Cancer Center, please go directly to the Cancer Center and check in at the registration area.   Wear comfortable clothing and clothing appropriate for easy access to any Portacath or PICC line.   We strive to give you quality time with your provider. You may need to reschedule your appointment if you arrive late (15 or more minutes).  Arriving late affects you and other patients whose appointments are after yours.  Also, if you miss three or more appointments without notifying the office, you may be dismissed from the clinic at the provider's discretion.      For prescription refill requests, have your pharmacy contact our office and allow 72 hours for refills to be completed.    Today you received the following chemotherapy and/or immunotherapy agents: Darzalex Faspro, Zometa   To help prevent nausea and vomiting after your treatment, we encourage you to take your nausea medication as directed.  BELOW ARE SYMPTOMS THAT SHOULD BE REPORTED IMMEDIATELY: *FEVER GREATER THAN 100.4 F (38 C) OR HIGHER *CHILLS OR SWEATING *NAUSEA AND VOMITING THAT IS NOT CONTROLLED WITH YOUR NAUSEA MEDICATION *UNUSUAL SHORTNESS OF BREATH *UNUSUAL BRUISING OR BLEEDING *URINARY PROBLEMS (pain or burning when urinating, or frequent urination) *BOWEL PROBLEMS (unusual diarrhea, constipation, pain near the anus) TENDERNESS IN MOUTH AND THROAT WITH OR WITHOUT PRESENCE OF ULCERS (sore throat, sores in mouth, or a toothache) UNUSUAL RASH, SWELLING OR PAIN  UNUSUAL VAGINAL DISCHARGE OR ITCHING   Items with * indicate a potential emergency and should be followed up as soon as possible or go to the Emergency Department if any problems should occur.  Please show the CHEMOTHERAPY ALERT CARD or  IMMUNOTHERAPY ALERT CARD at check-in to the Emergency Department and triage nurse.  Should you have questions after your visit or need to cancel or reschedule your appointment, please contact [facility] - A [facility]  Dept: [phone]  and follow the prompts.  Office hours are 8:00 a.m. to 4:30 p.m. Monday - Friday. Please note that voicemails left after 4:00 p.m. may not be returned until the following business day.  We are closed weekends and major holidays. You have access to a nurse at all times for urgent questions. Please call the main number to the clinic Dept: [phone] and follow the prompts.   For any non-urgent questions, you may also contact your provider using MyChart. We now offer e-Visits for anyone 73 and older to request care online for non-urgent symptoms. For details visit [email or web address].   Also download the MyChart app! Go to the app store, search "MyChart", open the app, select [place], and log in with your MyChart username and password.

## 2024-06-26 LAB — KAPPA/LAMBDA LIGHT CHAINS
Kappa free light chain: 7.2 mg/L (ref 3.3–19.4)
Kappa, lambda light chain ratio: 1.2 (ref 0.26–1.65)
Lambda free light chains: 6 mg/L (ref 5.7–26.3)

## 2024-06-30 LAB — MULTIPLE MYELOMA PANEL, SERUM
Albumin SerPl Elph-Mcnc: 3.2 g/dL (ref 2.9–4.4)
Albumin/Glob SerPl: 1.4 (ref 0.7–1.7)
Alpha 1: 0.4 g/dL (ref 0.0–0.4)
Alpha2 Glob SerPl Elph-Mcnc: 0.8 g/dL (ref 0.4–1.0)
B-Globulin SerPl Elph-Mcnc: 0.8 g/dL (ref 0.7–1.3)
Gamma Glob SerPl Elph-Mcnc: 0.4 g/dL (ref 0.4–1.8)
Globulin, Total: 2.4 g/dL (ref 2.2–3.9)
IgA: 37 mg/dL — ABNORMAL LOW (ref 61–437)
IgG (Immunoglobin G), Serum: 448 mg/dL — ABNORMAL LOW (ref 603–1613)
IgM (Immunoglobulin M), Srm: 12 mg/dL — ABNORMAL LOW (ref 15–143)
M Protein SerPl Elph-Mcnc: 0.1 g/dL — ABNORMAL HIGH
Total Protein ELP: 5.6 g/dL — ABNORMAL LOW (ref 6.0–8.5)

## 2024-07-08 ENCOUNTER — Other Ambulatory Visit: Payer: Self-pay | Admitting: Hematology

## 2024-07-09 ENCOUNTER — Encounter: Payer: Self-pay | Admitting: Nurse Practitioner

## 2024-07-09 ENCOUNTER — Inpatient Hospital Stay: Admitting: Nurse Practitioner

## 2024-07-09 ENCOUNTER — Inpatient Hospital Stay (HOSPITAL_BASED_OUTPATIENT_CLINIC_OR_DEPARTMENT_OTHER): Admitting: Hematology

## 2024-07-09 ENCOUNTER — Inpatient Hospital Stay

## 2024-07-09 ENCOUNTER — Inpatient Hospital Stay: Attending: Hematology

## 2024-07-09 ENCOUNTER — Other Ambulatory Visit: Payer: Self-pay

## 2024-07-09 VITALS — BP 147/67 | HR 51 | Temp 97.9°F | Resp 20 | Wt 173.3 lb

## 2024-07-09 DIAGNOSIS — C9000 Multiple myeloma not having achieved remission: Secondary | ICD-10-CM

## 2024-07-09 DIAGNOSIS — G893 Neoplasm related pain (acute) (chronic): Secondary | ICD-10-CM | POA: Diagnosis not present

## 2024-07-09 DIAGNOSIS — Z515 Encounter for palliative care: Secondary | ICD-10-CM | POA: Diagnosis not present

## 2024-07-09 DIAGNOSIS — R530 Neoplastic (malignant) related fatigue: Secondary | ICD-10-CM

## 2024-07-09 DIAGNOSIS — Z79620 Long term (current) use of immunosuppressive biologic: Secondary | ICD-10-CM | POA: Insufficient documentation

## 2024-07-09 DIAGNOSIS — Z5112 Encounter for antineoplastic immunotherapy: Secondary | ICD-10-CM | POA: Insufficient documentation

## 2024-07-09 LAB — CBC WITH DIFFERENTIAL (CANCER CENTER ONLY)
Abs Immature Granulocytes: 0.01 K/uL (ref 0.00–0.07)
Basophils Absolute: 0 K/uL (ref 0.0–0.1)
Basophils Relative: 0 %
Eosinophils Absolute: 0.4 K/uL (ref 0.0–0.5)
Eosinophils Relative: 15 %
HCT: 40.1 % (ref 39.0–52.0)
Hemoglobin: 13.4 g/dL (ref 13.0–17.0)
Immature Granulocytes: 0 %
Lymphocytes Relative: 39 %
Lymphs Abs: 1 K/uL (ref 0.7–4.0)
MCH: 32.3 pg (ref 26.0–34.0)
MCHC: 33.4 g/dL (ref 30.0–36.0)
MCV: 96.6 fL (ref 80.0–100.0)
Monocytes Absolute: 0.3 K/uL (ref 0.1–1.0)
Monocytes Relative: 14 %
Neutro Abs: 0.8 K/uL — ABNORMAL LOW (ref 1.7–7.7)
Neutrophils Relative %: 32 %
Platelet Count: 163 K/uL (ref 150–400)
RBC: 4.15 MIL/uL — ABNORMAL LOW (ref 4.22–5.81)
RDW: 14.8 % (ref 11.5–15.5)
WBC Count: 2.5 K/uL — ABNORMAL LOW (ref 4.0–10.5)
nRBC: 0 % (ref 0.0–0.2)

## 2024-07-09 LAB — COMPREHENSIVE METABOLIC PANEL WITH GFR
ALT: 23 U/L (ref 0–44)
AST: 16 U/L (ref 15–41)
Albumin: 3.8 g/dL (ref 3.5–5.0)
Alkaline Phosphatase: 109 U/L (ref 38–126)
Anion gap: 4 — ABNORMAL LOW (ref 5–15)
BUN: 16 mg/dL (ref 8–23)
CO2: 30 mmol/L (ref 22–32)
Calcium: 9 mg/dL (ref 8.9–10.3)
Chloride: 106 mmol/L (ref 98–111)
Creatinine, Ser: 0.64 mg/dL (ref 0.61–1.24)
GFR, Estimated: >60 mL/min (ref >60)
Glucose, Bld: 100 mg/dL — ABNORMAL HIGH (ref 70–99)
Potassium: 4.8 mmol/L (ref 3.5–5.1)
Sodium: 140 mmol/L (ref 135–145)
Total Bilirubin: 0.8 mg/dL (ref 0.0–1.2)
Total Protein: 6 g/dL — ABNORMAL LOW (ref 6.5–8.1)

## 2024-07-09 MED ORDER — ACETAMINOPHEN 325 MG PO TABS
650.0000 mg | ORAL_TABLET | Freq: Once | ORAL | Status: AC
  Administered 2024-07-09: 650 mg via ORAL
  Filled 2024-07-09: qty 2

## 2024-07-09 MED ORDER — OXYCODONE HCL 5 MG PO TABS: 5.0000 mg | ORAL_TABLET | Freq: Three times a day (TID) | ORAL | 0 refills | Status: DC | PRN

## 2024-07-09 MED ORDER — DARATUMUMAB-HYALURONIDASE-FIHJ 1800-30000 MG-UT/15ML ~~LOC~~ SOLN
1800.0000 mg | Freq: Once | SUBCUTANEOUS | Status: AC
  Administered 2024-07-09: 1800 mg via SUBCUTANEOUS
  Filled 2024-07-09: qty 15

## 2024-07-09 MED ORDER — LENALIDOMIDE 25 MG PO CAPS: 25.0000 mg | ORAL_CAPSULE | Freq: Every day | ORAL | 0 refills | Status: DC

## 2024-07-09 MED ORDER — DIPHENHYDRAMINE HCL 25 MG PO CAPS
25.0000 mg | ORAL_CAPSULE | Freq: Once | ORAL | Status: AC
  Administered 2024-07-09: 25 mg via ORAL
  Filled 2024-07-09: qty 1

## 2024-07-09 MED ORDER — DEXAMETHASONE 6 MG PO TABS
20.0000 mg | ORAL_TABLET | Freq: Once | ORAL | Status: AC
  Administered 2024-07-09: 20 mg via ORAL
  Filled 2024-07-09: qty 2

## 2024-07-09 NOTE — Progress Notes (Signed)
 [name]   Telephone:(336) [phone] [phone]   Clinic Follow up Note   Patient Care Team: Joe Cole, MD as PCP - General (Family Medicine)  Date of Service:  07/09/2024  CHIEF COMPLAINT: f/u of multiple myeloma  CURRENT THERAPY:  First-line chemotherapy daratumumab , dexamethasone  and Revlimid   Oncology History   Hypercalcemia of malignancy - Secondary to multiple myeloma -Patient received IV fluids, Zometa  and calcitonin, resolved after medical treatment.  Multiple myeloma (HCC) -IgG kappa type, stage I - Patient with admitted to the hospital on March 29, 2023 for hypercalcemia and diffuse lytic bone lesions on CT and MRI. -Bone marrow biopsy showed plasmacytoma.  Due to the sample limitation, the accurate percentage of plasma cell cannot be determined -Multiple myeloma showed IgG kappa monoclonal protein 1.1g/dl, elevated serum IgG and kappa light chain level with ratio 4.2.  Beta-2 microglobulin 3.0, LDH normal. -Based on the above lab results, diffuse bone lesions, hypercalcemia, mild renal insufficiency and the bone marrow biopsy, patient meets the criteria for multiple myeloma -I recommended induction chemotherapy Dara, Revlimid  and dexa, he started on 04/23/2024  Assessment & Plan IgG type multiple myeloma on treatment IgG type multiple myeloma with significant improvement in IgG levels and M protein levels. Current treatment regimen is well-tolerated with minimal side effects. M protein level is 0.1, close to undetectable, indicating a good response to treatment. Light chain levels have normalized. Bone marrow biopsy discussed to confirm remission, but deferred due to current good response. Maintenance therapy with Revlimid  discussed as a future option. Bone marrow transplant discussed as a potential future option, but not currently pursued due to good response to current treatment and potential complications associated with transplant. CAR T and  biosimilar therapies discussed as future options if needed. He prefers to continue current treatment and reassess if necessary. - Continue current treatment regimen with oral chemotherapy (two weeks on, one week off). - Continue Zometa  every three months. - Will consider bone marrow biopsy before transitioning to maintenance therapy. - Will discuss potential referral to a transplant center for consultation if needed in the future. - Will transition to maintenance therapy with Revlimid  after 4-6 cycle induction chemotherapy.  Bradycardia with first degree AV block Bradycardia with heart rate ranging from 44 to 51 bpm. First degree AV block noted on previous EKG. Bradycardia not causing significant symptoms, but may contribute to fatigue. Carvedilol  was discontinued without significant change in heart rate. No recent EKG performed since August 2021. - Repeated EKG today to assess current heart rate and AV block status.  Plan - Lab reviewed, adequate for treatment, will proceed daratumumab  today and continue every 2 weeks - He will continue Revlimid  at home - Obtain EKG for his bradycardia. -f/u in 4 weeks    SUMMARY OF ONCOLOGIC HISTORY: Oncology History  Multiple myeloma (HCC)  04/08/2024 Initial Diagnosis   Multiple myeloma (HCC)   04/23/2024 - 04/23/2024 Chemotherapy   Patient is on Treatment Plan : MYELOMA NEWLY DIAGNOSED Daratumumab  IV + Lenalidomide  + Dexamethasone  Weekly (DaraRd) q28d     04/23/2024 -  Chemotherapy   Patient is on Treatment Plan : MYELOMA RELAPSED REFRACTORY Daratumumab  SQ + Lenalidomide  + Dexamethasone  (DaraRd) q28d        Discussed the use of AI scribe software for clinical note transcription with the patient, who gave verbal consent to proceed.  History of Present Illness Joe [name] is a 73 year old male with multiple myeloma who presents for follow-up.  He is tolerating the treatment  for multiple myeloma well, with manageable side effects. Recent  laboratory results from October 29th show a significant drop in IgG levels, a very low M protein level at 0.1, and normalized light chain levels. He is on a regimen of oral chemotherapy, taking it for three weeks on and one week off, and has recently completed a week off. He receives Zometa  every three months, with the last dose administered last month.  He experiences bradycardia, with a heart rate around 44-45 beats per minute at home. He has a history of first-degree AV block and discontinued carvedilol  without significant change in heart rate. He experiences some fatigue in the afternoons.  He continues to take oxycodone  for pain management, using it sparingly, about one pill a day. He feels generally well and capable of performing daily activities without significant pain.     All other systems were reviewed with the patient and are negative.  MEDICAL HISTORY:  Past Medical History:  Diagnosis Date   Anxiety    Diabetes mellitus without complication (HCC)     SURGICAL HISTORY: No past surgical history on file.  I have reviewed the social history and family history with the patient and they are unchanged from previous note.  ALLERGIES:  has no known allergies.  MEDICATIONS:  Current Outpatient Medications  Medication Sig Dispense Refill   acyclovir  (ZOVIRAX ) 400 MG tablet Take 1 tablet (400 mg total) by mouth 2 (two) times daily. 180 tablet 1   aspirin EC 81 MG tablet Take 81 mg by mouth daily. Swallow whole.     calcium  citrate (CALCITRATE - DOSED IN MG ELEMENTAL CALCIUM ) 950 (200 Ca) MG tablet Take 600 mg of elemental calcium  by mouth 3 (three) times daily.     cholecalciferol (VITAMIN D3) 25 MCG (1000 UNIT) tablet Take 3,000 Units by mouth daily.     dexamethasone  (DECADRON ) 4 MG tablet Take 5 tablets (20 mg total) by mouth once a week. Take on the week on Wednesday without Dara injection 30 tablet 1   diazepam  (VALIUM ) 2 MG tablet Take 2 mg by mouth in the morning and at  bedtime.     esomeprazole (NEXIUM) 40 MG capsule Take 40 mg by mouth daily before breakfast.     famotidine  (PEPCID ) 20 MG tablet Take 20 mg by mouth at bedtime.     FLONASE ALLERGY RELIEF 50 MCG/ACT nasal spray Place 1-2 sprays into both nostrils in the morning and at bedtime.     JARDIANCE 10 MG TABS tablet Take 10 mg by mouth daily.     lenalidomide  (REVLIMID ) 25 MG capsule Take 1 capsule (25 mg total) by mouth daily. For 21 days and off for 7 days for a 28 day cycle.  [employer] Auth #[id number]     Date Obtained 07/09/2024 21 capsule 0   Multiple Vitamin (MULTIVITAMIN) tablet Take 1 tablet by mouth daily with breakfast.     ondansetron  (ZOFRAN ) 8 MG tablet Take 1 tablet (8 mg total) by mouth every 8 (eight) hours as needed for nausea or vomiting. 30 tablet 1   ONETOUCH VERIO test strip      oxyCODONE  (OXY IR/ROXICODONE ) 5 MG immediate release tablet Take 1 tablet (5 mg total) by mouth every 8 (eight) hours as needed for severe pain (pain score 7-10). 60 tablet 0   rosuvastatin  (CRESTOR ) 10 MG tablet Take 10 mg by mouth daily.     sildenafil (VIAGRA) 100 MG tablet Take 100 mg by mouth daily as needed (for E.D.).  tamsulosin (FLOMAX) 0.4 MG CAPS capsule 1 capsule.     Turmeric (QC TUMERIC COMPLEX) 500 MG CAPS Take 4,800 capsules by mouth 2 (two) times daily.     No current facility-administered medications for this visit.    PHYSICAL EXAMINATION: ECOG PERFORMANCE STATUS: 1 - Symptomatic but completely ambulatory  There were no vitals filed for this visit. Wt Readings from Last 3 Encounters:  07/09/24 173 lb 4.8 oz (78.6 kg)  06/25/24 169 lb 7 oz (76.9 kg)  06/25/24 169 lb 11.2 oz (77 kg)     GENERAL:alert, no distress and comfortable SKIN: skin color, texture, turgor are normal, no rashes or significant lesions EYES: normal, Conjunctiva are pink and non-injected, sclera clear NECK: supple, thyroid  normal size, non-tender, without nodularity LYMPH:  no palpable lymphadenopathy in the  cervical, axillary  LUNGS: clear to auscultation and percussion with normal breathing effort HEART: regular rate & rhythm and no murmurs and no lower extremity edema ABDOMEN:abdomen soft, non-tender and normal bowel sounds Musculoskeletal:no cyanosis of digits and no clubbing  NEURO: alert & oriented x 3 with fluent speech, no focal motor/sensory deficits  Physical Exam VITALS: P- 51  LABORATORY DATA:  I have reviewed the data as listed    Latest Ref Rng & Units 07/09/2024   11:39 AM 06/25/2024    8:26 AM 06/17/2024   12:41 PM  CBC  WBC 4.0 - 10.5 K/uL 2.5  2.8  2.9   Hemoglobin 13.0 - 17.0 g/dL 86.5  86.9  87.4   Hematocrit 39.0 - 52.0 % 40.1  38.6  37.7   Platelets 150 - 400 K/uL 163  162  208         Latest Ref Rng & Units 07/09/2024   11:39 AM 06/17/2024   12:41 PM 06/11/2024    9:38 AM  CMP  Glucose 70 - 99 mg/dL 899  91  92   BUN 8 - 23 mg/dL 16  19  14    Creatinine 0.61 - 1.24 mg/dL 9.35  9.38  9.40   Sodium 135 - 145 mmol/L 140  141  141   Potassium 3.5 - 5.1 mmol/L 4.8  4.6  3.9   Chloride 98 - 111 mmol/L 106  108  107   CO2 22 - 32 mmol/L 30  27  31    Calcium  8.9 - 10.3 mg/dL 9.0  9.0  8.6   Total Protein 6.5 - 8.1 g/dL 6.0  5.7  5.4   Total Bilirubin 0.0 - 1.2 mg/dL 0.8  0.7  0.7   Alkaline Phos 38 - 126 U/L 109  122  138   AST 15 - 41 U/L 16  20  22    ALT 0 - 44 U/L 23  23  25        RADIOGRAPHIC STUDIES: I have personally reviewed the radiological images as listed and agreed with the findings in the report. No results found.    Orders Placed This Encounter  Procedures   Kappa/lambda light chains    Standing Status:   Future    Expected Date:   08/19/2024    Expiration Date:   08/19/2025   Multiple Myeloma Panel (SPEP&IFE w/QIG)    Standing Status:   Future    Expected Date:   08/19/2024    Expiration Date:   08/19/2025   CBC with Differential (Cancer Center Only)    Standing Status:   Future    Expected Date:   08/19/2024    Expiration  Date:  08/19/2025   CBC with Differential (Cancer Center Only)    Standing Status:   Future    Expected Date:   09/02/2024    Expiration Date:   09/02/2025   Kappa/lambda light chains    Standing Status:   Future    Expected Date:   09/16/2024    Expiration Date:   09/16/2025   Multiple Myeloma Panel (SPEP&IFE w/QIG)    Standing Status:   Future    Expected Date:   09/16/2024    Expiration Date:   09/16/2025   CBC with Differential (Cancer Center Only)    Standing Status:   Future    Expected Date:   09/16/2024    Expiration Date:   09/16/2025   CBC with Differential (Cancer Center Only)    Standing Status:   Future    Expected Date:   09/30/2024    Expiration Date:   09/30/2025   All questions were answered. The patient knows to call the clinic with any problems, questions or concerns. No barriers to learning was detected. The total time spent in the appointment was 30 minutes, including review of chart and various tests results, discussions about plan of care and coordination of care plan     Onita Mattock, MD 07/09/2024

## 2024-07-09 NOTE — Assessment & Plan Note (Signed)
-   Secondary to multiple myeloma -Patient received IV fluids, Zometa  and calcitonin, resolved after medical treatment.

## 2024-07-09 NOTE — Assessment & Plan Note (Signed)
-  IgG kappa type, stage I - Patient with admitted to the hospital on March 29, 2023 for hypercalcemia and diffuse lytic bone lesions on CT and MRI. -Bone marrow biopsy showed plasmacytoma.  Due to the sample limitation, the accurate percentage of plasma cell cannot be determined -Multiple myeloma showed IgG kappa monoclonal protein 1.1g/dl, elevated serum IgG and kappa light chain level with ratio 4.2.  Beta-2 microglobulin 3.0, LDH normal. -Based on the above lab results, diffuse bone lesions, hypercalcemia, mild renal insufficiency and the bone marrow biopsy, patient meets the criteria for multiple myeloma -I recommended induction chemotherapy Dara, Revlimid  and dexa, he started on 04/23/2024

## 2024-07-10 ENCOUNTER — Encounter: Payer: Self-pay | Admitting: Hematology

## 2024-07-10 ENCOUNTER — Other Ambulatory Visit: Payer: Self-pay

## 2024-07-10 DIAGNOSIS — R530 Neoplastic (malignant) related fatigue: Secondary | ICD-10-CM

## 2024-07-10 DIAGNOSIS — Z9221 Personal history of antineoplastic chemotherapy: Secondary | ICD-10-CM

## 2024-07-10 DIAGNOSIS — C9000 Multiple myeloma not having achieved remission: Secondary | ICD-10-CM

## 2024-07-10 NOTE — Progress Notes (Signed)
 Palliative Medicine [facility]  Telephone:(336) [phone] Fax:(336) [phone]   Name: Joe [name] Date: 07/10/2024 MRN: [id number]  DOB: 02/20/1951  Patient Care Team: Joe Cole, MD as PCP - General (Family Medicine)    INTERVAL HISTORY: Joe [name] is a 73 y.o. male with oncologic medical history including multiple myeloma currently undergoing treatment with Revlimid , dexamethasone  and daratumumab  .  Palliative is seeing patient for symptom management and goals of care.   SOCIAL HISTORY:     reports that he has never smoked. He does not have any smokeless tobacco history on file. He reports that he does not drink alcohol and does not use drugs.  ADVANCE DIRECTIVES:  Patient has a documented directive. Reports his daughter, [name] and niece [name] is his medical decision make   CODE STATUS: Full code  PAST MEDICAL HISTORY: Past Medical History:  Diagnosis Date   Anxiety    Diabetes mellitus without complication (HCC)     ALLERGIES:  has no known allergies.  MEDICATIONS:  Current Outpatient Medications  Medication Sig Dispense Refill   acyclovir  (ZOVIRAX ) 400 MG tablet Take 1 tablet (400 mg total) by mouth 2 (two) times daily. 180 tablet 1   aspirin EC 81 MG tablet Take 81 mg by mouth daily. Swallow whole.     calcium  citrate (CALCITRATE - DOSED IN MG ELEMENTAL CALCIUM ) 950 (200 Ca) MG tablet Take 600 mg of elemental calcium  by mouth 3 (three) times daily.     cholecalciferol (VITAMIN D3) 25 MCG (1000 UNIT) tablet Take 3,000 Units by mouth daily.     dexamethasone  (DECADRON ) 4 MG tablet Take 5 tablets (20 mg total) by mouth once a week. Take on the week on Wednesday without Dara injection 30 tablet 1   diazepam  (VALIUM ) 2 MG tablet Take 2 mg by mouth in the morning and at bedtime.     esomeprazole (NEXIUM) 40 MG capsule Take 40 mg by mouth daily before breakfast.     famotidine  (PEPCID ) 20 MG tablet Take 20 mg by mouth at bedtime.     FLONASE  ALLERGY RELIEF 50 MCG/ACT nasal spray Place 1-2 sprays into both nostrils in the morning and at bedtime.     JARDIANCE 10 MG TABS tablet Take 10 mg by mouth daily.     lenalidomide  (REVLIMID ) 25 MG capsule Take 1 capsule (25 mg total) by mouth daily. For 21 days and off for 7 days for a 28 day cycle.  [employer] Auth #[id number]     Date Obtained 07/09/2024 21 capsule 0   Multiple Vitamin (MULTIVITAMIN) tablet Take 1 tablet by mouth daily with breakfast.     ondansetron  (ZOFRAN ) 8 MG tablet Take 1 tablet (8 mg total) by mouth every 8 (eight) hours as needed for nausea or vomiting. 30 tablet 1   ONETOUCH VERIO test strip      oxyCODONE  (OXY IR/ROXICODONE ) 5 MG immediate release tablet Take 1 tablet (5 mg total) by mouth every 8 (eight) hours as needed for severe pain (pain score 7-10). 60 tablet 0   rosuvastatin  (CRESTOR ) 10 MG tablet Take 10 mg by mouth daily.     sildenafil (VIAGRA) 100 MG tablet Take 100 mg by mouth daily as needed (for E.D.).     tamsulosin (FLOMAX) 0.4 MG CAPS capsule 1 capsule.     Turmeric (QC TUMERIC COMPLEX) 500 MG CAPS Take 4,800 capsules by mouth 2 (two) times daily.     No current facility-administered medications for this visit.  VITAL SIGNS: BP (!) 147/67   Pulse (!) 51   Temp 97.9 F (36.6 C)   Resp 20   Wt 173 lb 4.8 oz (78.6 kg)   SpO2 100%   BMI 27.14 kg/m  Filed Weights   07/09/24 1145  Weight: 173 lb 4.8 oz (78.6 kg)    Estimated body mass index is 27.14 kg/m as calculated from the following:   Height as of 06/17/24: 5' 7 (1.702 m).   Weight as of this encounter: 173 lb 4.8 oz (78.6 kg).   PERFORMANCE STATUS (ECOG) : 1 - Symptomatic but completely ambulatory  Physical Exam General: NAD Cardiovascular: regular rate and rhythm Pulmonary: normal breathing pattern Extremities: right lower extremity trace edema, no joint deformities Skin: no rashes Neurological: AAO x3  IMPRESSION: Discussed the use of AI scribe software for clinical note  transcription with the patient, who gave verbal consent to proceed.  History of Present Illness Joe [name] is a 73 year old male with Multiple Myeloma who presents for symptom management follow-up.[name] He is accompanied by his sister, [name]. No acute distress noted. Denies uncontrolled nausea, vomiting, constipation, or diarrhea. Feels well overall. Denies ongoing lower extremity edema.   He experiences fluctuating energy levels, feeling fatigued on some days with a lack of energy and a desire to remain sedentary. However, he also has days with increased energy, such as yesterday when he felt energetic. His energy typically diminishes by 2 or 3 PM, after which he prefers to rest.  Joe [name] experiences back pain upon waking, which he attributes to sleeping. He takes oxycodone  for pain relief, which he finds effective. He occasionally takes a couple of oxycodone  pills a day, which helps him feel good and allows him to be productive, such as selling cars and other items.  No adjustments to current regimen at this time.  He has oxycodone  5 mg every 8 hours as needed on hand.  His blood sugar levels are stable, usually around 100. He reports an increased appetite, humorously stating he could 'eat a small elephant as long as he's well done.' His weight has increased from 169 to 173 pounds, which he feels has alleviated discomfort in his tailbone that he experienced after losing weight.  He experiences urinary frequency, needing to get up every hour to hour and a half at night to urinate. He has been prescribed medication to help with this, but it causes urgency, leading to accidents if he cannot reach a bathroom in time. He humorously recounts having to make it to the restroom in time while on an elevator.   We will continue to support and follow as needed.   Goals of Care We discussed Joe [name] current illness and what it means in the larger context of his on-going co-morbidities. Natural disease  trajectory and expectations were discussed.   Patient and his family are realistic in their understanding. He is clear in expressed wishes to continue to treat the treatable allow him every opportunity to continue to thrive while managing his symptoms and minimizing any discomfort or distress.   Patient confirms he has a completed advanced directive in the home naming his daughter [name] and his niece [name] as his medical decision-maker in the event he is unable to speak for himself.  Joe [name] would not want his life prolonged in the event of a terminal illness, irreversible causes, or vegetative state.  I encouraged patient to bring in his advance directive to have on file.   I  discussed the importance of continued conversation with family and their medical providers regarding overall plan of care and treatment options, ensuring decisions are within the context of the patients values and GOCs. Assessment & Plan Urinary frequency (improved) Previously experienced urinary frequency, with nocturia every hour. Discontinued medication due to adverse effects. Currently improved, with 3-4 hours between voids at night. - Use medication as needed based on symptoms  Fatigue Intermittent fatigue with periods of increased energy.   Neoplasm-related pain Back pain upon waking, managed with oxycodone . Pain improves after taking medication and having coffee. Pain is significant enough to require medication for relief. - Continue oxycodone  as needed for pain management.  Follow-up I will plan to see patient back in 4-6 weeks.  Sooner if needed.  Patient expressed understanding and was in agreement with this plan. He also understands that He can call the clinic at any time with any questions, concerns, or complaints.   Any controlled substances utilized were prescribed in the context of palliative care. PDMP has been reviewed.   Visit consisted of counseling and education dealing with the complex and  emotionally intense issues of symptom management and palliative care in the setting of serious and potentially life-threatening illness.  Levon Borer, AGPCNP-BC  Palliative Medicine Team/[facility]

## 2024-07-23 ENCOUNTER — Encounter: Payer: Self-pay | Admitting: Hematology

## 2024-07-23 ENCOUNTER — Inpatient Hospital Stay

## 2024-07-23 VITALS — BP 146/56 | HR 55 | Temp 98.7°F | Resp 16 | Wt 177.2 lb

## 2024-07-23 DIAGNOSIS — C9000 Multiple myeloma not having achieved remission: Secondary | ICD-10-CM

## 2024-07-23 DIAGNOSIS — Z79620 Long term (current) use of immunosuppressive biologic: Secondary | ICD-10-CM | POA: Diagnosis not present

## 2024-07-23 DIAGNOSIS — Z5112 Encounter for antineoplastic immunotherapy: Secondary | ICD-10-CM | POA: Diagnosis not present

## 2024-07-23 LAB — CBC WITH DIFFERENTIAL (CANCER CENTER ONLY)
Abs Immature Granulocytes: 0.01 K/uL (ref 0.00–0.07)
Basophils Absolute: 0 K/uL (ref 0.0–0.1)
Basophils Relative: 1 %
Eosinophils Absolute: 0.3 K/uL (ref 0.0–0.5)
Eosinophils Relative: 7 %
HCT: 39.4 % (ref 39.0–52.0)
Hemoglobin: 13.1 g/dL (ref 13.0–17.0)
Immature Granulocytes: 0 %
Lymphocytes Relative: 25 %
Lymphs Abs: 1 K/uL (ref 0.7–4.0)
MCH: 32.6 pg (ref 26.0–34.0)
MCHC: 33.2 g/dL (ref 30.0–36.0)
MCV: 98 fL (ref 80.0–100.0)
Monocytes Absolute: 0.2 K/uL (ref 0.1–1.0)
Monocytes Relative: 5 %
Neutro Abs: 2.5 K/uL (ref 1.7–7.7)
Neutrophils Relative %: 62 %
Platelet Count: 183 K/uL (ref 150–400)
RBC: 4.02 MIL/uL — ABNORMAL LOW (ref 4.22–5.81)
RDW: 15.1 % (ref 11.5–15.5)
WBC Count: 4.1 K/uL (ref 4.0–10.5)
nRBC: 0 % (ref 0.0–0.2)

## 2024-07-23 LAB — COMPREHENSIVE METABOLIC PANEL WITH GFR
ALT: 20 U/L (ref 0–44)
AST: 19 U/L (ref 15–41)
Albumin: 3.9 g/dL (ref 3.5–5.0)
Alkaline Phosphatase: 93 U/L (ref 38–126)
Anion gap: 9 (ref 5–15)
BUN: 16 mg/dL (ref 8–23)
CO2: 28 mmol/L (ref 22–32)
Calcium: 8.4 mg/dL — ABNORMAL LOW (ref 8.9–10.3)
Chloride: 105 mmol/L (ref 98–111)
Creatinine, Ser: 0.6 mg/dL — ABNORMAL LOW (ref 0.61–1.24)
GFR, Estimated: >60 mL/min (ref >60)
Glucose, Bld: 100 mg/dL — ABNORMAL HIGH (ref 70–99)
Potassium: 3.8 mmol/L (ref 3.5–5.1)
Sodium: 141 mmol/L (ref 135–145)
Total Bilirubin: 0.5 mg/dL (ref 0.0–1.2)
Total Protein: 6 g/dL — ABNORMAL LOW (ref 6.5–8.1)

## 2024-07-23 MED ORDER — DIPHENHYDRAMINE HCL 25 MG PO CAPS
25.0000 mg | ORAL_CAPSULE | Freq: Once | ORAL | Status: AC
  Administered 2024-07-23: 25 mg via ORAL
  Filled 2024-07-23: qty 1

## 2024-07-23 MED ORDER — DEXAMETHASONE 6 MG PO TABS
20.0000 mg | ORAL_TABLET | Freq: Once | ORAL | Status: AC
  Administered 2024-07-23: 20 mg via ORAL
  Filled 2024-07-23: qty 2

## 2024-07-23 MED ORDER — ACETAMINOPHEN 325 MG PO TABS
650.0000 mg | ORAL_TABLET | Freq: Once | ORAL | Status: AC
  Administered 2024-07-23: 650 mg via ORAL
  Filled 2024-07-23: qty 2

## 2024-07-23 MED ORDER — DARATUMUMAB-HYALURONIDASE-FIHJ 1800-30000 MG-UT/15ML ~~LOC~~ SOLN
1800.0000 mg | Freq: Once | SUBCUTANEOUS | Status: AC
  Administered 2024-07-23: 1800 mg via SUBCUTANEOUS
  Filled 2024-07-23: qty 15

## 2024-07-24 LAB — KAPPA/LAMBDA LIGHT CHAINS
Kappa free light chain: 7.1 mg/L (ref 3.3–19.4)
Kappa, lambda light chain ratio: 1.29 (ref 0.26–1.65)
Lambda free light chains: 5.5 mg/L — ABNORMAL LOW (ref 5.7–26.3)

## 2024-07-27 LAB — MULTIPLE MYELOMA PANEL, SERUM
Albumin SerPl Elph-Mcnc: 3.4 g/dL (ref 2.9–4.4)
Albumin/Glob SerPl: 1.8 — ABNORMAL HIGH (ref 0.7–1.7)
Alpha 1: 0.3 g/dL (ref 0.0–0.4)
Alpha2 Glob SerPl Elph-Mcnc: 0.7 g/dL (ref 0.4–1.0)
B-Globulin SerPl Elph-Mcnc: 0.7 g/dL (ref 0.7–1.3)
Gamma Glob SerPl Elph-Mcnc: 0.3 g/dL — ABNORMAL LOW (ref 0.4–1.8)
Globulin, Total: 2 g/dL — ABNORMAL LOW (ref 2.2–3.9)
IgA: 30 mg/dL — ABNORMAL LOW (ref 61–437)
IgG (Immunoglobin G), Serum: 413 mg/dL — ABNORMAL LOW (ref 603–1613)
IgM (Immunoglobulin M), Srm: 14 mg/dL — ABNORMAL LOW (ref 15–143)
M Protein SerPl Elph-Mcnc: 0.1 g/dL — ABNORMAL HIGH
Total Protein ELP: 5.4 g/dL — ABNORMAL LOW (ref 6.0–8.5)

## 2024-07-31 DIAGNOSIS — E1165 Type 2 diabetes mellitus with hyperglycemia: Secondary | ICD-10-CM | POA: Diagnosis not present

## 2024-07-31 DIAGNOSIS — E7800 Pure hypercholesterolemia, unspecified: Secondary | ICD-10-CM | POA: Diagnosis not present

## 2024-08-04 ENCOUNTER — Encounter: Payer: Self-pay | Admitting: Hematology

## 2024-08-04 DIAGNOSIS — E1169 Type 2 diabetes mellitus with other specified complication: Secondary | ICD-10-CM | POA: Diagnosis not present

## 2024-08-04 DIAGNOSIS — E785 Hyperlipidemia, unspecified: Secondary | ICD-10-CM | POA: Diagnosis not present

## 2024-08-04 DIAGNOSIS — E1165 Type 2 diabetes mellitus with hyperglycemia: Secondary | ICD-10-CM | POA: Diagnosis not present

## 2024-08-05 ENCOUNTER — Encounter: Payer: Self-pay | Admitting: Nurse Practitioner

## 2024-08-05 ENCOUNTER — Other Ambulatory Visit: Payer: Self-pay | Admitting: Nurse Practitioner

## 2024-08-05 DIAGNOSIS — C9000 Multiple myeloma not having achieved remission: Secondary | ICD-10-CM

## 2024-08-05 DIAGNOSIS — G893 Neoplasm related pain (acute) (chronic): Secondary | ICD-10-CM

## 2024-08-06 ENCOUNTER — Other Ambulatory Visit: Payer: Self-pay

## 2024-08-06 ENCOUNTER — Inpatient Hospital Stay: Attending: Hematology

## 2024-08-06 ENCOUNTER — Inpatient Hospital Stay (HOSPITAL_BASED_OUTPATIENT_CLINIC_OR_DEPARTMENT_OTHER): Admitting: Nurse Practitioner

## 2024-08-06 ENCOUNTER — Encounter: Payer: Self-pay | Admitting: Nurse Practitioner

## 2024-08-06 VITALS — BP 138/62 | HR 46 | Temp 98.3°F | Resp 18

## 2024-08-06 DIAGNOSIS — Z5112 Encounter for antineoplastic immunotherapy: Secondary | ICD-10-CM | POA: Insufficient documentation

## 2024-08-06 DIAGNOSIS — R530 Neoplastic (malignant) related fatigue: Secondary | ICD-10-CM | POA: Diagnosis not present

## 2024-08-06 DIAGNOSIS — R251 Tremor, unspecified: Secondary | ICD-10-CM | POA: Diagnosis not present

## 2024-08-06 DIAGNOSIS — Z515 Encounter for palliative care: Secondary | ICD-10-CM

## 2024-08-06 DIAGNOSIS — Z79899 Other long term (current) drug therapy: Secondary | ICD-10-CM | POA: Diagnosis not present

## 2024-08-06 DIAGNOSIS — G8929 Other chronic pain: Secondary | ICD-10-CM | POA: Diagnosis not present

## 2024-08-06 DIAGNOSIS — G893 Neoplasm related pain (acute) (chronic): Secondary | ICD-10-CM | POA: Diagnosis not present

## 2024-08-06 DIAGNOSIS — Z79620 Long term (current) use of immunosuppressive biologic: Secondary | ICD-10-CM | POA: Diagnosis not present

## 2024-08-06 DIAGNOSIS — C9000 Multiple myeloma not having achieved remission: Secondary | ICD-10-CM | POA: Diagnosis present

## 2024-08-06 DIAGNOSIS — M545 Low back pain, unspecified: Secondary | ICD-10-CM

## 2024-08-06 LAB — CBC WITH DIFFERENTIAL (CANCER CENTER ONLY)
Abs Immature Granulocytes: 0.01 K/uL (ref 0.00–0.07)
Basophils Absolute: 0 K/uL (ref 0.0–0.1)
Basophils Relative: 1 %
Eosinophils Absolute: 0.4 K/uL (ref 0.0–0.5)
Eosinophils Relative: 14 %
HCT: 38.7 % — ABNORMAL LOW (ref 39.0–52.0)
Hemoglobin: 13 g/dL (ref 13.0–17.0)
Immature Granulocytes: 0 %
Lymphocytes Relative: 28 %
Lymphs Abs: 0.8 K/uL (ref 0.7–4.0)
MCH: 32.4 pg (ref 26.0–34.0)
MCHC: 33.6 g/dL (ref 30.0–36.0)
MCV: 96.5 fL (ref 80.0–100.0)
Monocytes Absolute: 0.3 K/uL (ref 0.1–1.0)
Monocytes Relative: 11 %
Neutro Abs: 1.4 K/uL — ABNORMAL LOW (ref 1.7–7.7)
Neutrophils Relative %: 46 %
Platelet Count: 148 K/uL — ABNORMAL LOW (ref 150–400)
RBC: 4.01 MIL/uL — ABNORMAL LOW (ref 4.22–5.81)
RDW: 14.7 % (ref 11.5–15.5)
WBC Count: 3 K/uL — ABNORMAL LOW (ref 4.0–10.5)
nRBC: 0 % (ref 0.0–0.2)

## 2024-08-06 LAB — COMPREHENSIVE METABOLIC PANEL WITH GFR
ALT: 25 U/L (ref 0–44)
AST: 19 U/L (ref 15–41)
Albumin: 3.8 g/dL (ref 3.5–5.0)
Alkaline Phosphatase: 77 U/L (ref 38–126)
Anion gap: 9 (ref 5–15)
BUN: 15 mg/dL (ref 8–23)
CO2: 26 mmol/L (ref 22–32)
Calcium: 8.8 mg/dL — ABNORMAL LOW (ref 8.9–10.3)
Chloride: 106 mmol/L (ref 98–111)
Creatinine, Ser: 0.6 mg/dL — ABNORMAL LOW (ref 0.61–1.24)
GFR, Estimated: >60 mL/min (ref >60)
Glucose, Bld: 134 mg/dL — ABNORMAL HIGH (ref 70–99)
Potassium: 3.8 mmol/L (ref 3.5–5.1)
Sodium: 141 mmol/L (ref 135–145)
Total Bilirubin: 0.6 mg/dL (ref 0.0–1.2)
Total Protein: 5.8 g/dL — ABNORMAL LOW (ref 6.5–8.1)

## 2024-08-06 MED ORDER — DEXAMETHASONE 6 MG PO TABS
20.0000 mg | ORAL_TABLET | Freq: Once | ORAL | Status: AC
  Administered 2024-08-06: 20 mg via ORAL
  Filled 2024-08-06: qty 2

## 2024-08-06 MED ORDER — ACETAMINOPHEN 325 MG PO TABS
650.0000 mg | ORAL_TABLET | Freq: Once | ORAL | Status: AC
  Administered 2024-08-06: 650 mg via ORAL
  Filled 2024-08-06: qty 2

## 2024-08-06 MED ORDER — OXYCODONE HCL 5 MG PO TABS: 5.0000 mg | ORAL_TABLET | Freq: Three times a day (TID) | ORAL | 0 refills | Status: DC | PRN

## 2024-08-06 MED ORDER — DARATUMUMAB-HYALURONIDASE-FIHJ 1800-30000 MG-UT/15ML ~~LOC~~ SOLN
1800.0000 mg | Freq: Once | SUBCUTANEOUS | Status: AC
  Administered 2024-08-06: 1800 mg via SUBCUTANEOUS
  Filled 2024-08-06: qty 15

## 2024-08-06 MED ORDER — DIPHENHYDRAMINE HCL 25 MG PO CAPS
25.0000 mg | ORAL_CAPSULE | Freq: Once | ORAL | Status: AC
  Administered 2024-08-06: 25 mg via ORAL
  Filled 2024-08-06: qty 1

## 2024-08-06 MED ORDER — LENALIDOMIDE 25 MG PO CAPS: 25.0000 mg | ORAL_CAPSULE | Freq: Every day | ORAL | 0 refills | Status: DC

## 2024-08-06 NOTE — Patient Instructions (Signed)
 [facility] - A DEPT OF [facility]  Discharge Instructions: Thank you for choosing [facility] to provide your oncology and hematology care.   If you have a lab appointment with the Cancer Center, please go directly to the Cancer Center and check in at the registration area.   Wear comfortable clothing and clothing appropriate for easy access to any Portacath or PICC line.   We strive to give you quality time with your provider. You may need to reschedule your appointment if you arrive late (15 or more minutes).  Arriving late affects you and other patients whose appointments are after yours.  Also, if you miss three or more appointments without notifying the office, you may be dismissed from the clinic at the provider's discretion.      For prescription refill requests, have your pharmacy contact our office and allow 72 hours for refills to be completed.    Today you received the following chemotherapy and/or immunotherapy agents: Darzalex Faspro, Zometa   To help prevent nausea and vomiting after your treatment, we encourage you to take your nausea medication as directed.  BELOW ARE SYMPTOMS THAT SHOULD BE REPORTED IMMEDIATELY: *FEVER GREATER THAN 100.4 F (38 C) OR HIGHER *CHILLS OR SWEATING *NAUSEA AND VOMITING THAT IS NOT CONTROLLED WITH YOUR NAUSEA MEDICATION *UNUSUAL SHORTNESS OF BREATH *UNUSUAL BRUISING OR BLEEDING *URINARY PROBLEMS (pain or burning when urinating, or frequent urination) *BOWEL PROBLEMS (unusual diarrhea, constipation, pain near the anus) TENDERNESS IN MOUTH AND THROAT WITH OR WITHOUT PRESENCE OF ULCERS (sore throat, sores in mouth, or a toothache) UNUSUAL RASH, SWELLING OR PAIN  UNUSUAL VAGINAL DISCHARGE OR ITCHING   Items with * indicate a potential emergency and should be followed up as soon as possible or go to the Emergency Department if any problems should occur.  Please show the CHEMOTHERAPY ALERT CARD or  IMMUNOTHERAPY ALERT CARD at check-in to the Emergency Department and triage nurse.  Should you have questions after your visit or need to cancel or reschedule your appointment, please contact [facility] - A [facility]  Dept: [phone]  and follow the prompts.  Office hours are 8:00 a.m. to 4:30 p.m. Monday - Friday. Please note that voicemails left after 4:00 p.m. may not be returned until the following business day.  We are closed weekends and major holidays. You have access to a nurse at all times for urgent questions. Please call the main number to the clinic Dept: [phone] and follow the prompts.   For any non-urgent questions, you may also contact your provider using MyChart. We now offer e-Visits for anyone 73 and older to request care online for non-urgent symptoms. For details visit [email or web address].   Also download the MyChart app! Go to the app store, search "MyChart", open the app, select [place], and log in with your MyChart username and password.

## 2024-08-06 NOTE — Progress Notes (Signed)
 Palliative Medicine [facility]  [phone] [phone]) [phone]   Name: Joe [name] Date: 08/06/2024 MRN: [id number]  DOB: 10-Jan-1951  Patient Care Team: Joe Cole, MD as PCP - General (Family Medicine)    INTERVAL HISTORY: Joe [name] is a 73 y.o. male with oncologic medical history including multiple myeloma currently undergoing treatment with Revlimid , dexamethasone  and daratumumab  .  Palliative is seeing patient for symptom management and goals of care.   SOCIAL HISTORY:     reports that he has never smoked. He does not have any smokeless tobacco history on file. He reports that he does not drink alcohol and does not use drugs.  ADVANCE DIRECTIVES:  Patient has a documented directive. Reports his daughter, Joe [name] and niece Joe [name] is his medical decision make   CODE STATUS: Full code  PAST MEDICAL HISTORY: Past Medical History:  Diagnosis Date   Anxiety    Diabetes mellitus without complication (HCC)     ALLERGIES:  has no known allergies.  MEDICATIONS:  Current Outpatient Medications  Medication Sig Dispense Refill   acyclovir  (ZOVIRAX ) 400 MG tablet Take 1 tablet (400 mg total) by mouth 2 (two) times daily. 180 tablet 1   aspirin EC 81 MG tablet Take 81 mg by mouth daily. Swallow whole.     calcium  citrate (CALCITRATE - DOSED IN MG ELEMENTAL CALCIUM ) 950 (200 Ca) MG tablet Take 600 mg of elemental calcium  by mouth 3 (three) times daily.     cholecalciferol (VITAMIN D3) 25 MCG (1000 UNIT) tablet Take 3,000 Units by mouth daily.     dexamethasone  (DECADRON ) 4 MG tablet Take 5 tablets (20 mg total) by mouth once a week. Take on the week on Wednesday without Dara injection 30 tablet 1   diazepam  (VALIUM ) 2 MG tablet Take 2 mg by mouth in the morning and at bedtime.     esomeprazole (NEXIUM) 40 MG capsule Take 40 mg by mouth daily before breakfast.     famotidine  (PEPCID ) 20 MG tablet Take 20 mg by mouth at bedtime.     FLONASE  ALLERGY RELIEF 50 MCG/ACT nasal spray Place 1-2 sprays into both nostrils in the morning and at bedtime.     JARDIANCE 10 MG TABS tablet Take 10 mg by mouth daily.     lenalidomide  (REVLIMID ) 25 MG capsule Take 1 capsule (25 mg total) by mouth daily. For 21 days and off for 7 days for a 28 day cycle.  [employer] Auth #[id number]     Date Obtained 08/06/2024 21 capsule 0   Multiple Vitamin (MULTIVITAMIN) tablet Take 1 tablet by mouth daily with breakfast.     ondansetron  (ZOFRAN ) 8 MG tablet Take 1 tablet (8 mg total) by mouth every 8 (eight) hours as needed for nausea or vomiting. 30 tablet 1   ONETOUCH VERIO test strip      oxyCODONE  (OXY IR/ROXICODONE ) 5 MG immediate release tablet Take 1 tablet (5 mg total) by mouth every 8 (eight) hours as needed for severe pain (pain score 7-10). 60 tablet 0   rosuvastatin  (CRESTOR ) 10 MG tablet Take 10 mg by mouth daily.     sildenafil (VIAGRA) 100 MG tablet Take 100 mg by mouth daily as needed (for E.D.).     tamsulosin (FLOMAX) 0.4 MG CAPS capsule 1 capsule.     Turmeric (QC TUMERIC COMPLEX) 500 MG CAPS Take 4,800 capsules by mouth 2 (two) times daily.     No current facility-administered medications for this visit.  Facility-Administered Medications Ordered in Other Visits  Medication Dose Route Frequency Provider Last Rate Last Admin   daratumumab -hyaluronidase -fihj (DARZALEX  FASPRO) 1800-30000 MG-UT/15ML chemo SQ injection 1,800 mg  1,800 mg Subcutaneous Once Joe Callander, MD        VITAL SIGNS: There were no vitals taken for this visit. There were no vitals filed for this visit.   Estimated body mass index is 27.76 kg/m as calculated from the following:   Height as of 06/17/24: 5' 7 (1.702 m).   Weight as of 07/23/24: 177 lb 4 oz (80.4 kg).   PERFORMANCE STATUS (ECOG) : 1 - Symptomatic but completely ambulatory  Physical Exam General: NAD Cardiovascular: regular rate and rhythm Pulmonary: normal breathing pattern Extremities: right lower  extremity trace edema, no joint deformities Skin: no rashes Neurological: AAO x3  IMPRESSION: Discussed the use of AI scribe software for clinical note transcription with the patient, who gave verbal consent to proceed.  History of Present Illness Joe [name] is a 73 year old male with Multiple Myeloma who was seen during infusion for symptom management follow-up. He is accompanied by his niece, Joe [name]. No acute distress noted. Denies uncontrolled nausea, vomiting, constipation, or diarrhea. Feels well overall. Occasional fatigue however is remaining active.   No constipation and reports regular bowel movements, which he attributes to a diet that includes more vegetables. He has a good appetite and has gained weight. Current weight 177lbs. He wakes up at 3 AM for a snack, typically a bowl of cereal or a Boost drink, before returning to sleep.  He recently visited an endocrinologist for diabetes management, where his blood work was reported as good, with a hemoglobin A1c of 5.7.   He experiences significant back pain, describing it as 'nagging on me pretty bad.' The pain is managed with oxycodone , taken once in the morning and once in the evening, but it remains particularly severe after prolonged sitting. He finds it difficult to rise from a seated position at times. Joe [name] states despite his back discomfort he does feel oxycodone  is effective and allows the pain to be manageable. He is scheduled for an MRI next week and is anxiously awaiting for results to determine if there are other interventions or changes that need to be made related to his back pain. At this time he does not desire any adjustments to medication and wants to wait for MRI findings.   All questions answered and support provided.   Goals of Care We discussed Mr. Joe [name] current illness and what it means in the larger context of his on-going co-morbidities. Natural disease trajectory and expectations were discussed.   Patient  and his family are realistic in their understanding. He is clear in expressed wishes to continue to treat the treatable allow him every opportunity to continue to thrive while managing his symptoms and minimizing any discomfort or distress.   Patient confirms he has a completed advanced directive in the home naming his daughter Joe [name] and his niece Joe [name] as his medical decision-maker in the event he is unable to speak for himself.  Mr. Springfield would not want his life prolonged in the event of a terminal illness, irreversible causes, or vegetative state.  I encouraged patient to bring in his advance directive to have on file.   I discussed the importance of continued conversation with family and their medical providers regarding overall plan of care and treatment options, ensuring decisions are within the context of the patients values and GOCs. Assessment & Plan Fatigue  Intermittent fatigue with periods of increased energy.   Neoplasm related pain/ low back pain Persists, exacerbated by prolonged sitting. Pain is managed with oxycodone , taken once in the morning and once in the evening. Current regimen allows pain to be manageable. Awaiting MRI results to determine etiology of pain. No issues with constipation. Appetite is good, with weight gain noted. No adjustments to regimen at this time.  - Continue oxycodone  as currently prescribed. - Await MRI results to guide further management. - Sent refill for oxycodone .  Follow-up I will plan to see patient back in 4-6 weeks.  Sooner if needed.  Patient expressed understanding and was in agreement with this plan. He also understands that He can call the clinic at any time with any questions, concerns, or complaints.   Any controlled substances utilized were prescribed in the context of palliative care. PDMP has been reviewed.   Visit consisted of counseling and education dealing with the complex and emotionally intense issues of symptom management and  palliative care in the setting of serious and potentially life-threatening illness.  Levon Borer, AGPCNP-BC  Palliative Medicine Team/[facility]

## 2024-08-11 ENCOUNTER — Telehealth: Payer: Self-pay | Admitting: Hematology

## 2024-08-12 ENCOUNTER — Ambulatory Visit (HOSPITAL_COMMUNITY)

## 2024-08-15 ENCOUNTER — Ambulatory Visit (HOSPITAL_COMMUNITY)
Admission: RE | Admit: 2024-08-15 | Discharge: 2024-08-15 | Disposition: A | Discharge status: Home / Self Care | Source: Ambulatory Visit | Attending: Nurse Practitioner | Admitting: Nurse Practitioner

## 2024-08-15 DIAGNOSIS — M899 Disorder of bone, unspecified: Secondary | ICD-10-CM

## 2024-08-15 DIAGNOSIS — C9000 Multiple myeloma not having achieved remission: Secondary | ICD-10-CM | POA: Insufficient documentation

## 2024-08-15 DIAGNOSIS — G893 Neoplasm related pain (acute) (chronic): Secondary | ICD-10-CM | POA: Insufficient documentation

## 2024-08-15 DIAGNOSIS — S32040A Wedge compression fracture of fourth lumbar vertebra, initial encounter for closed fracture: Secondary | ICD-10-CM | POA: Diagnosis not present

## 2024-08-15 MED ORDER — GADOBUTROL 1 MMOL/ML IV SOLN
8.0000 mL | Freq: Once | INTRAVENOUS | Status: AC | PRN
  Administered 2024-08-15: 8 mL via INTRAVENOUS

## 2024-08-17 NOTE — Assessment & Plan Note (Signed)
-  IgG kappa type, stage I - Patient with admitted to the hospital on March 29, 2023 for hypercalcemia and diffuse lytic bone lesions on CT and MRI. -Bone marrow biopsy showed plasmacytoma.  Due to the sample limitation, the accurate percentage of plasma cell cannot be determined -Multiple myeloma showed IgG kappa monoclonal protein 1.1g/dl, elevated serum IgG and kappa light chain level with ratio 4.2.  Beta-2 microglobulin 3.0, LDH normal. -Based on the above lab results, diffuse bone lesions, hypercalcemia, mild renal insufficiency and the bone marrow biopsy, patient meets the criteria for multiple myeloma -I recommended induction chemotherapy Dara, Revlimid  and dexa, he started on 04/23/2024

## 2024-08-18 ENCOUNTER — Inpatient Hospital Stay: Admitting: Hematology

## 2024-08-18 ENCOUNTER — Inpatient Hospital Stay

## 2024-08-18 ENCOUNTER — Inpatient Hospital Stay (HOSPITAL_BASED_OUTPATIENT_CLINIC_OR_DEPARTMENT_OTHER): Admitting: Hematology

## 2024-08-18 VITALS — BP 158/64 | HR 52 | Temp 97.3°F | Resp 17 | Wt 182.9 lb

## 2024-08-18 DIAGNOSIS — R251 Tremor, unspecified: Secondary | ICD-10-CM

## 2024-08-18 DIAGNOSIS — C9000 Multiple myeloma not having achieved remission: Secondary | ICD-10-CM

## 2024-08-18 DIAGNOSIS — Z5112 Encounter for antineoplastic immunotherapy: Secondary | ICD-10-CM | POA: Diagnosis not present

## 2024-08-18 LAB — COMPREHENSIVE METABOLIC PANEL WITH GFR
ALT: 20 U/L (ref 0–44)
AST: 25 U/L (ref 15–41)
Albumin: 4.1 g/dL (ref 3.5–5.0)
Alkaline Phosphatase: 77 U/L (ref 38–126)
Anion gap: 11 (ref 5–15)
BUN: 13 mg/dL (ref 8–23)
CO2: 28 mmol/L (ref 22–32)
Calcium: 8.6 mg/dL — ABNORMAL LOW (ref 8.9–10.3)
Chloride: 104 mmol/L (ref 98–111)
Creatinine, Ser: 0.68 mg/dL (ref 0.61–1.24)
GFR, Estimated: >60 mL/min
Glucose, Bld: 108 mg/dL — ABNORMAL HIGH (ref 70–99)
Potassium: 3.5 mmol/L (ref 3.5–5.1)
Sodium: 142 mmol/L (ref 135–145)
Total Bilirubin: 0.6 mg/dL (ref 0.0–1.2)
Total Protein: 6.2 g/dL — ABNORMAL LOW (ref 6.5–8.1)

## 2024-08-18 LAB — CBC WITH DIFFERENTIAL (CANCER CENTER ONLY)
Abs Immature Granulocytes: 0.01 K/uL (ref 0.00–0.07)
Basophils Absolute: 0 K/uL (ref 0.0–0.1)
Basophils Relative: 1 %
Eosinophils Absolute: 0.3 K/uL (ref 0.0–0.5)
Eosinophils Relative: 7 %
HCT: 40.7 % (ref 39.0–52.0)
Hemoglobin: 13.9 g/dL (ref 13.0–17.0)
Immature Granulocytes: 0 %
Lymphocytes Relative: 28 %
Lymphs Abs: 1.1 K/uL (ref 0.7–4.0)
MCH: 33.3 pg (ref 26.0–34.0)
MCHC: 34.2 g/dL (ref 30.0–36.0)
MCV: 97.4 fL (ref 80.0–100.0)
Monocytes Absolute: 0.2 K/uL (ref 0.1–1.0)
Monocytes Relative: 4 %
Neutro Abs: 2.5 K/uL (ref 1.7–7.7)
Neutrophils Relative %: 60 %
Platelet Count: 177 K/uL (ref 150–400)
RBC: 4.18 MIL/uL — ABNORMAL LOW (ref 4.22–5.81)
RDW: 14.7 % (ref 11.5–15.5)
WBC Count: 4.1 K/uL (ref 4.0–10.5)
nRBC: 0 % (ref 0.0–0.2)

## 2024-08-18 LAB — TSH: TSH: 1.23 u[IU]/mL (ref 0.350–4.500)

## 2024-08-18 LAB — T4, FREE: Free T4: 1.19 ng/dL (ref 0.80–2.00)

## 2024-08-18 NOTE — Progress Notes (Signed)
 " [facility]   Telephone:(336) [phone] Fax:(336) [phone]   Clinic Follow up Note   Patient Care Team: [name], MD as PCP - General (Family Medicine)  Date of Service:  08/18/2024  CHIEF COMPLAINT: f/u of MM  CURRENT THERAPY:  Dara, dexa and Revlimid   Oncology History   Multiple myeloma (HCC) -IgG kappa type, stage I - Patient with admitted to the hospital on March 29, 2023 for hypercalcemia and diffuse lytic bone lesions on CT and MRI. -Bone marrow biopsy showed plasmacytoma.  Due to the sample limitation, the accurate percentage of plasma cell cannot be determined -Multiple myeloma showed IgG kappa monoclonal protein 1.1g/dl, elevated serum IgG and kappa light chain level with ratio 4.2.  Beta-2 microglobulin 3.0, LDH normal. -Based on the above lab results, diffuse bone lesions, hypercalcemia, mild renal insufficiency and the bone marrow biopsy, patient meets the criteria for multiple myeloma -I recommended induction chemotherapy Dara, Revlimid  and dexa, he started on 04/23/2024  Assessment & Plan Multiple myeloma not having achieved remission Undergoing induction therapy with very good response; recent myeloma panel values are very low and light chain levels have normalized. Remission not yet confirmed, pending bone marrow biopsy after six months of therapy. Tolerating therapy well without significant cytopenias or organ dysfunction, except for mild hypocalcemia and hypoproteinemia. Long-term therapy anticipated due to relapse risk if therapy is discontinued. Alternative monitoring strategies discussed if he declines bone marrow biopsy. - Ordered multiple myeloma panel (results pending). - Scheduled next infusion this week and subsequent treatments in January and February to complete induction. - Planned transition to monthly infusions after induction phase. - Discussed possible future transition to oral maintenance therapy (Revlimid ) pending response and bone  marrow biopsy results. - Discussed repeat bone marrow biopsy after six months of therapy to confirm remission, if he agrees. - Discussed alternative monitoring with blood and urine tests if he declines biopsy. - Planned repeat 24-hour urine collection to assess disease status. - Provided anticipatory guidance regarding long-term therapy and relapse risk. - Scheduled follow-up for September 17, 2024.  Tremor Daily bilateral hand tremors and intermittent finger spasms, sometimes severe, exacerbated by hand use. Differential includes oxycodone  side effect, hypocalcemia, thyroid  dysfunction. Neurology referral will be considered if etiology remains unclear or symptoms worsen. - Planned review of MRI results when available. - Planned assessment of tremor by nurse practitioner Ivy). - Planned thyroid  function testing (add-on if possible, otherwise at next visit). - Discussed possible reduction of oxycodone  if tremor worsens. - Discussed neurology referral if etiology remains unclear or symptoms worsen. - Instructed to call office if tremor worsens.  Hypocalcemia Chronic mild hypocalcemia, likely secondary to underlying disease and/or therapy. Currently on calcium  and vitamin D  supplementation; calcium  remains at low-normal. - Recommended increase in calcium  supplementation to four tablets daily (two tablets twice daily). - Continued current vitamin D  supplementation.  Chronic low back pain Longstanding low back pain, likely related to multiple myeloma and/or muscular factors. Pain worsens after prolonged sitting and improves with activity. Managed with low-dose oxycodone ; no significant worsening. MRI results pending. - Continued oxycodone  5 mg twice daily. - Reviewed MRI of the back (results pending). - Provided anticipatory guidance regarding pain management and activity.  Hand tremor  - Started about a month ago, intermittent, no other neurological symptoms. - Possible related to oxycodone   versus Revlimid  - He will try to decrease oxycodone , and may hold Revlimid  to see if his tremor improves/resolve - If not, I will refer him to neurology  Plan - Lab reviewed adequate for treatment, he will proceed daratumumab  injection later this week and continue every 2 weeks - Due to his hand tremor, he will try to reduce oxycodone  dose, and hold Revlimid  to see if he is tremor resolves.  If this is related to Revlimid , I will reduce dose to 15 mg -f/u in 4 weeks   SUMMARY OF ONCOLOGIC HISTORY: Oncology History  Multiple myeloma (HCC)  04/08/2024 Initial Diagnosis   Multiple myeloma (HCC)   04/23/2024 - 04/23/2024 Chemotherapy   Patient is on Treatment Plan : MYELOMA NEWLY DIAGNOSED Daratumumab  IV + Lenalidomide  + Dexamethasone  Weekly (DaraRd) q28d     04/23/2024 -  Chemotherapy   Patient is on Treatment Plan : MYELOMA RELAPSED REFRACTORY Daratumumab  SQ + Lenalidomide  + Dexamethasone  (DaraRd) q28d        Discussed the use of AI scribe software for clinical note transcription with the patient, who gave verbal consent to proceed.  History of Present Illness Joe [name] is a 73 year old male with multiple myeloma undergoing induction therapy who presents for follow-up to assess disease status and treatment response.  He is receiving induction therapy for multiple myeloma, with his last Zemaira infusion on June 25, 2024, and additional infusions scheduled before transition to monthly maintenance. He feels well without myeloma-related symptoms and performs normal daily activities.  He has daily intermittent hand tremors and finger spasms, initially in the left hand and now also in the right. Episodes can be severe, with finger contractions that sometimes require manual release and are worsened by increased hand use such as working with tools. The last significant finger contraction was several days ago, with a thumb spasm this morning. No other body areas are affected.  He has chronic  low back pain controlled with oxycodone  5 mg twice daily. Pain is stable, worse in the morning or after prolonged sitting, and improves with activity and prednisone.  He has chronic mild hypocalcemia, with calcium  typically 8.4 to 9 mg/dL and most recently 8.6 mg/dL. He takes calcium  600 mg three times daily, vitamin D  3000 IU three times daily, a multivitamin, and turmeric. Thyroid  studies in August 2025 were normal, and he is not on thyroid  medication.  He had one self-limited 6-hour episode of nausea, vomiting, and diarrhea about 12 hours after a recent MRI, without rash, pruritus, or swelling. He denies abnormal bleeding, bruising, fevers, chills, sweats, significant fatigue, or activity limitation. He does not use tobacco, alcohol, or illicit drugs.     All other systems were reviewed with the patient and are negative.  MEDICAL HISTORY:  Past Medical History:  Diagnosis Date   Anxiety    Diabetes mellitus without complication (HCC)     SURGICAL HISTORY: No past surgical history on file.  I have reviewed the social history and family history with the patient and they are unchanged from previous note.  ALLERGIES:  has no known allergies.  MEDICATIONS:  Current Outpatient Medications  Medication Sig Dispense Refill   acyclovir  (ZOVIRAX ) 400 MG tablet Take 1 tablet (400 mg total) by mouth 2 (two) times daily. 180 tablet 1   aspirin EC 81 MG tablet Take 81 mg by mouth daily. Swallow whole.     calcium  citrate (CALCITRATE - DOSED IN MG ELEMENTAL CALCIUM ) 950 (200 Ca) MG tablet Take 600 mg of elemental calcium  by mouth 3 (three) times daily.     cholecalciferol (VITAMIN D3) 25 MCG (1000 UNIT) tablet Take 3,000 Units by mouth daily.     dexamethasone  (  DECADRON ) 4 MG tablet Take 5 tablets (20 mg total) by mouth once a week. Take on the week on Wednesday without Dara injection 30 tablet 1   diazepam  (VALIUM ) 2 MG tablet Take 2 mg by mouth in the morning and at bedtime.     esomeprazole  (NEXIUM) 40 MG capsule Take 40 mg by mouth daily before breakfast.     famotidine  (PEPCID ) 20 MG tablet Take 20 mg by mouth at bedtime.     FLONASE ALLERGY RELIEF 50 MCG/ACT nasal spray Place 1-2 sprays into both nostrils in the morning and at bedtime.     JARDIANCE 10 MG TABS tablet Take 10 mg by mouth daily.     lenalidomide  (REVLIMID ) 25 MG capsule Take 1 capsule (25 mg total) by mouth daily. For 21 days and off for 7 days for a 28 day cycle.  [employer] Auth #[id number]     Date Obtained 08/06/2024 21 capsule 0   Multiple Vitamin (MULTIVITAMIN) tablet Take 1 tablet by mouth daily with breakfast.     ondansetron  (ZOFRAN ) 8 MG tablet Take 1 tablet (8 mg total) by mouth every 8 (eight) hours as needed for nausea or vomiting. 30 tablet 1   ONETOUCH VERIO test strip      oxyCODONE  (OXY IR/ROXICODONE ) 5 MG immediate release tablet Take 1 tablet (5 mg total) by mouth every 8 (eight) hours as needed for severe pain (pain score 7-10). 60 tablet 0   rosuvastatin  (CRESTOR ) 10 MG tablet Take 10 mg by mouth daily.     sildenafil (VIAGRA) 100 MG tablet Take 100 mg by mouth daily as needed (for E.D.).     tamsulosin (FLOMAX) 0.4 MG CAPS capsule 1 capsule.     Turmeric (QC TUMERIC COMPLEX) 500 MG CAPS Take 4,800 capsules by mouth 2 (two) times daily.     No current facility-administered medications for this visit.    PHYSICAL EXAMINATION: ECOG PERFORMANCE STATUS: 1 - Symptomatic but completely ambulatory  Vitals:   08/18/24 1452 08/18/24 1453  BP: (!) 163/59 (!) 158/64  Pulse: (!) 52   Resp: 17   Temp: (!) 97.3 F (36.3 C)   SpO2: 100%    Wt Readings from Last 3 Encounters:  08/18/24 182 lb 14.4 oz (83 kg)  07/23/24 177 lb 4 oz (80.4 kg)  07/09/24 173 lb 4.8 oz (78.6 kg)     GENERAL:alert, no distress and comfortable SKIN: skin color, texture, turgor are normal, no rashes or significant lesions EYES: normal, Conjunctiva are pink and non-injected, sclera clear NECK: supple, thyroid  normal size,  non-tender, without nodularity LYMPH:  no palpable lymphadenopathy in the cervical, axillary  LUNGS: clear to auscultation and percussion with normal breathing effort HEART: regular rate & rhythm and no murmurs and no lower extremity edema ABDOMEN:abdomen soft, non-tender and normal bowel sounds Musculoskeletal:no cyanosis of digits and no clubbing  NEURO: alert & oriented x 3 with fluent speech, no focal motor/sensory deficits  Physical Exam    LABORATORY DATA:  I have reviewed the data as listed    Latest Ref Rng & Units 08/18/2024    2:00 PM 08/06/2024   10:04 AM 07/23/2024   12:25 PM  CBC  WBC 4.0 - 10.5 K/uL 4.1  3.0  4.1   Hemoglobin 13.0 - 17.0 g/dL 86.0  86.9  86.8   Hematocrit 39.0 - 52.0 % 40.7  38.7  39.4   Platelets 150 - 400 K/uL 177  148  183  Latest Ref Rng & Units 08/18/2024    2:00 PM 08/06/2024   10:04 AM 07/23/2024   12:25 PM  CMP  Glucose 70 - 99 mg/dL 891  865  899   BUN 8 - 23 mg/dL 13  15  16    Creatinine 0.61 - 1.24 mg/dL 9.31  9.39  9.39   Sodium 135 - 145 mmol/L 142  141  141   Potassium 3.5 - 5.1 mmol/L 3.5  3.8  3.8   Chloride 98 - 111 mmol/L 104  106  105   CO2 22 - 32 mmol/L 28  26  28    Calcium  8.9 - 10.3 mg/dL 8.6  8.8  8.4   Total Protein 6.5 - 8.1 g/dL 6.2  5.8  6.0   Total Bilirubin 0.0 - 1.2 mg/dL 0.6  0.6  0.5   Alkaline Phos 38 - 126 U/L 77  77  93   AST 15 - 41 U/L 25  19  19    ALT 0 - 44 U/L 20  25  20        RADIOGRAPHIC STUDIES: I have personally reviewed the radiological images as listed and agreed with the findings in the report. No results found.    Orders Placed This Encounter  Procedures   TSH    Standing Status:   Future    Number of Occurrences:   1    Expected Date:   08/18/2024    Expiration Date:   08/18/2025   T4, free    Standing Status:   Future    Number of Occurrences:   1    Expected Date:   08/18/2024    Expiration Date:   08/18/2025   Kappa/lambda light chains    Standing Status:    Future    Expected Date:   10/14/2024    Expiration Date:   10/14/2025   Multiple Myeloma Panel (SPEP&IFE w/QIG)    Standing Status:   Future    Expected Date:   10/14/2024    Expiration Date:   10/14/2025   CBC with Differential (Cancer Center Only)    Standing Status:   Future    Expected Date:   10/14/2024    Expiration Date:   10/14/2025   All questions were answered. The patient knows to call the clinic with any problems, questions or concerns. No barriers to learning was detected. The total time spent in the appointment was 30 minutes, including review of chart and various tests results, discussions about plan of care and coordination of care plan     Onita Mattock, MD 08/18/2024     "

## 2024-08-19 LAB — KAPPA/LAMBDA LIGHT CHAINS
Kappa free light chain: 5.4 mg/L (ref 3.3–19.4)
Kappa, lambda light chain ratio: 1.42 (ref 0.26–1.65)
Lambda free light chains: 3.8 mg/L — ABNORMAL LOW (ref 5.7–26.3)

## 2024-08-20 ENCOUNTER — Encounter: Payer: Self-pay | Admitting: Hematology

## 2024-08-20 ENCOUNTER — Inpatient Hospital Stay

## 2024-08-20 ENCOUNTER — Inpatient Hospital Stay: Admitting: Hematology

## 2024-08-20 ENCOUNTER — Inpatient Hospital Stay: Admitting: Nurse Practitioner

## 2024-08-20 ENCOUNTER — Encounter: Payer: Self-pay | Admitting: Nurse Practitioner

## 2024-08-20 VITALS — BP 147/63 | HR 54 | Temp 97.6°F | Resp 20 | Wt 182.6 lb

## 2024-08-20 DIAGNOSIS — Z515 Encounter for palliative care: Secondary | ICD-10-CM

## 2024-08-20 DIAGNOSIS — R251 Tremor, unspecified: Secondary | ICD-10-CM

## 2024-08-20 DIAGNOSIS — C9000 Multiple myeloma not having achieved remission: Secondary | ICD-10-CM

## 2024-08-20 DIAGNOSIS — G893 Neoplasm related pain (acute) (chronic): Secondary | ICD-10-CM | POA: Diagnosis not present

## 2024-08-20 DIAGNOSIS — R530 Neoplastic (malignant) related fatigue: Secondary | ICD-10-CM

## 2024-08-20 DIAGNOSIS — Z5112 Encounter for antineoplastic immunotherapy: Secondary | ICD-10-CM | POA: Diagnosis not present

## 2024-08-20 DIAGNOSIS — R5383 Other fatigue: Secondary | ICD-10-CM

## 2024-08-20 MED ORDER — ACETAMINOPHEN 325 MG PO TABS
650.0000 mg | ORAL_TABLET | Freq: Once | ORAL | Status: AC
  Administered 2024-08-20: 650 mg via ORAL
  Filled 2024-08-20: qty 2

## 2024-08-20 MED ORDER — DIPHENHYDRAMINE HCL 25 MG PO CAPS
25.0000 mg | ORAL_CAPSULE | Freq: Once | ORAL | Status: AC
  Administered 2024-08-20: 25 mg via ORAL
  Filled 2024-08-20: qty 1

## 2024-08-20 MED ORDER — DEXAMETHASONE 6 MG PO TABS
20.0000 mg | ORAL_TABLET | Freq: Once | ORAL | Status: AC
  Administered 2024-08-20: 20 mg via ORAL
  Filled 2024-08-20: qty 2

## 2024-08-20 MED ORDER — DARATUMUMAB-HYALURONIDASE-FIHJ 1800-30000 MG-UT/15ML ~~LOC~~ SOLN
1800.0000 mg | Freq: Once | SUBCUTANEOUS | Status: AC
  Administered 2024-08-20: 1800 mg via SUBCUTANEOUS
  Filled 2024-08-20: qty 15

## 2024-08-20 NOTE — Progress Notes (Signed)
 "    Palliative Medicine [facility]  Telephone:(336) [phone] Fax:(336) [phone]   Name: Joe [name] Date: 08/20/2024 MRN: [id number]  DOB: 10/11/1950  Patient Care Team: [name], MD as PCP - General (Family Medicine)    INTERVAL HISTORY: Joe [name] is a 73 y.o. male with oncologic medical history including multiple myeloma currently undergoing treatment with Revlimid , dexamethasone  and daratumumab  .  Palliative is seeing patient for symptom management and goals of care.   SOCIAL HISTORY:     reports that he has never smoked. He does not have any smokeless tobacco history on file. He reports that he does not drink alcohol and does not use drugs.  ADVANCE DIRECTIVES:  Patient has a documented directive. Reports his daughter, Joe [name] and niece Joe [name] is his medical decision make   CODE STATUS: Full code  PAST MEDICAL HISTORY: Past Medical History:  Diagnosis Date   Anxiety    Diabetes mellitus without complication (HCC)     ALLERGIES:  has no known allergies.  MEDICATIONS:  Current Outpatient Medications  Medication Sig Dispense Refill   acyclovir  (ZOVIRAX ) 400 MG tablet Take 1 tablet (400 mg total) by mouth 2 (two) times daily. 180 tablet 1   aspirin EC 81 MG tablet Take 81 mg by mouth daily. Swallow whole.     calcium  citrate (CALCITRATE - DOSED IN MG ELEMENTAL CALCIUM ) 950 (200 Ca) MG tablet Take 600 mg of elemental calcium  by mouth 3 (three) times daily.     cholecalciferol (VITAMIN D3) 25 MCG (1000 UNIT) tablet Take 3,000 Units by mouth daily.     dexamethasone  (DECADRON ) 4 MG tablet Take 5 tablets (20 mg total) by mouth once a week. Take on the week on Wednesday without Dara injection 30 tablet 1   diazepam  (VALIUM ) 2 MG tablet Take 2 mg by mouth in the morning and at bedtime.     esomeprazole (NEXIUM) 40 MG capsule Take 40 mg by mouth daily before breakfast.     famotidine  (PEPCID ) 20 MG tablet Take 20 mg by mouth at bedtime.     FLONASE  ALLERGY RELIEF 50 MCG/ACT nasal spray Place 1-2 sprays into both nostrils in the morning and at bedtime.     JARDIANCE 10 MG TABS tablet Take 10 mg by mouth daily.     lenalidomide  (REVLIMID ) 25 MG capsule Take 1 capsule (25 mg total) by mouth daily. For 21 days and off for 7 days for a 28 day cycle.  [employer] Auth #[id number]     Date Obtained 08/06/2024 21 capsule 0   Multiple Vitamin (MULTIVITAMIN) tablet Take 1 tablet by mouth daily with breakfast.     ondansetron  (ZOFRAN ) 8 MG tablet Take 1 tablet (8 mg total) by mouth every 8 (eight) hours as needed for nausea or vomiting. 30 tablet 1   ONETOUCH VERIO test strip      oxyCODONE  (OXY IR/ROXICODONE ) 5 MG immediate release tablet Take 1 tablet (5 mg total) by mouth every 8 (eight) hours as needed for severe pain (pain score 7-10). 60 tablet 0   rosuvastatin  (CRESTOR ) 10 MG tablet Take 10 mg by mouth daily.     sildenafil (VIAGRA) 100 MG tablet Take 100 mg by mouth daily as needed (for E.D.).     tamsulosin (FLOMAX) 0.4 MG CAPS capsule 1 capsule.     Turmeric (QC TUMERIC COMPLEX) 500 MG CAPS Take 4,800 capsules by mouth 2 (two) times daily.     No current facility-administered medications for this  visit.    VITAL SIGNS: BP (!) 147/63 Comment: re-check  Pulse (!) 54   Temp 97.6 F (36.4 C)   Resp 20   Wt 182 lb 9.6 oz (82.8 kg)   SpO2 99%   BMI 28.60 kg/m  Filed Weights   08/20/24 0831  Weight: 182 lb 9.6 oz (82.8 kg)     Estimated body mass index is 28.6 kg/m as calculated from the following:   Height as of 06/17/24: 5' 7 (1.702 m).   Weight as of this encounter: 182 lb 9.6 oz (82.8 kg).   PERFORMANCE STATUS (ECOG) : 1 - Symptomatic but completely ambulatory  Physical Exam General: NAD Cardiovascular: regular rate and rhythm Pulmonary: normal breathing pattern Extremities: right lower extremity trace edema, no joint deformities Skin: no rashes Neurological: AAO x3  IMPRESSION: Discussed the use of AI scribe software for  clinical note transcription with the patient, who gave verbal consent to proceed.  History of Present Illness Joe [name] is a 73 year old male with Multiple Myeloma who presents to clinic for symptom management follow-up. He is accompanied by his niece, Joe [name]. No acute distress noted. Denies uncontrolled nausea, vomiting, constipation, or diarrhea. Feels well overall. Occasional fatigue however is remaining active.   Appetite remains good. His current weight is up to 182lbs. He recently visited an endocrinologist for diabetes management, where his blood work was reported as good.    He experiences significant back pain, describing it as 'nagging on me pretty bad.' The pain is managed with oxycodone , taken once in the morning and once in the evening, but it remains particularly severe after prolonged sitting. He finds it difficult to rise from a seated position at times. Duell states despite his back discomfort he does feel oxycodone  is effective and allows the pain to be manageable. He is scheduled for an MRI next week and is anxiously awaiting for results to determine if there are other interventions or changes that need to be made related to his back pain. At this time he does not desire any adjustments to medication and wants to wait for MRI findings.   All questions answered and support provided.    Joe [name] is a 73 year old male who presents with tremors in both hands.  He has been experiencing tremors that initially began in his left hand and have since progressed to both hands. The tremors were particularly severe last Friday, coinciding with the day he had an MRI. He notes difficulty controlling his hands, which has affected his ability to perform tasks such as eating. We discussed potential etiologies as this was also discussed with Dr. Lanny.   He has been taking oxycodone  since August and diazepam  at a dose of 2 mg at night for a significant period (years). THe recently resumed  taking Revlimid  last Wednesday, and the tremors worsened two days later. He is currently on a cycle of Revlimid , taking it once daily for 21 days, and is in the first week of this cycle. He recalls feeling sick at night on the day of the MRI, which was also when the tremors were most pronounced.  He was instructed by Dr. Lanny to hold Revlimid  to see if any noticeable changes in tremors however he would like to continue and observe during his week off which will be around January 6th. We discussed close observation if no improvement will then make adjustments to oxycodone . He is currently only taking the oxycodone  1-2 times daily if needed.  Mr. Radde reports the tremors are much better currently and barely noticeable. No significant reoccurrences as he had previously. We will plan to continue close monitoring and refer to neurology as needed.  Goals of Care We discussed [name] current illness and what it means in the larger context of his on-going co-morbidities. Natural disease trajectory and expectations were discussed.   Patient and his family are realistic in their understanding. He is clear in expressed wishes to continue to treat the treatable allow him every opportunity to continue to thrive while managing his symptoms and minimizing any discomfort or distress.   Patient confirms he has a completed advanced directive in the home naming his daughter Joe [name] and his niece Joe [name] as his medical decision-maker in the event he is unable to speak for himself.  Mr. Tendler would not want his life prolonged in the event of a terminal illness, irreversible causes, or vegetative state.  I encouraged patient to bring in his advance directive to have on file.   I discussed the importance of continued conversation with family and their medical providers regarding overall plan of care and treatment options, ensuring decisions are within the context of the patients values and GOCs. Assessment &  Plan Fatigue Intermittent fatigue with periods of increased energy.   Neoplasm related pain/ low back pain Ongoing back pain however manageable.  Pain is controlled with as needed use of oxycodone .  No issues with constipation. Appetite is good, with weight gain noted. No adjustments to regimen at this time.  - Continue oxycodone  5 mg every 8 hours as needed.  Currently taking 1-2 daily.   - Await MRI results to guide further management. - Sent refill for oxycodone .  Tremor Bilateral hand tremors began in the left hand and progressed to both hands. Symptoms were significant on December 20th, coinciding with an MRI. Differential diagnosis includes medication side effects from Revlimid  or oxycodone . Diazepam  is taken at a low dose at night, unlikely to be the cause. Further evaluation is needed to determine the cause. Reports symptoms have significantly improve but will continue close monitoring.  - Hold Revlimid  during scheduled week off around January 6th. Prefers not to stop at this time.  - Monitor tremor symptoms during the Revlimid  break. - Will call the week of January 12th to assess tremor status. - If tremors persist after Revlimid  break, will consider adjusting oxycodone  or other medications. - If tremors do not resolve, will consider neurology consultation.  Follow-up I will plan to see patient back in 4-6 weeks.  Follow-up by telephone in 2-3 weeks.  Sooner if needed.  Patient expressed understanding and was in agreement with this plan. He also understands that He can call the clinic at any time with any questions, concerns, or complaints.   Any controlled substances utilized were prescribed in the context of palliative care. PDMP has been reviewed.   I personally spent a total of 30 minutes in the care of the patient today including preparing to see the patient, getting/reviewing separately obtained history, performing a medically appropriate exam/evaluation, counseling and  educating, referring and communicating with other health care professionals, and coordinating care. Visit consisted of counseling and education dealing with the complex and emotionally intense issues of symptom management and palliative care in the setting of serious and potentially life-threatening illness.  Levon Borer, AGPCNP-BC  Palliative Medicine Team/[facility]    "

## 2024-08-20 NOTE — Patient Instructions (Signed)

## 2024-08-25 ENCOUNTER — Telehealth (HOSPITAL_COMMUNITY): Payer: Self-pay | Admitting: Pharmacy Technician

## 2024-08-25 ENCOUNTER — Observation Stay (HOSPITAL_COMMUNITY)
Admission: EM | Admit: 2024-08-25 | Discharge: 2024-08-28 | Disposition: A | Discharge status: Home / Self Care | Source: Ambulatory Visit | Attending: Family Medicine | Admitting: Family Medicine

## 2024-08-25 ENCOUNTER — Other Ambulatory Visit: Payer: Self-pay

## 2024-08-25 ENCOUNTER — Other Ambulatory Visit (HOSPITAL_COMMUNITY): Payer: Self-pay

## 2024-08-25 ENCOUNTER — Emergency Department (HOSPITAL_COMMUNITY)

## 2024-08-25 ENCOUNTER — Encounter (HOSPITAL_COMMUNITY): Payer: Self-pay | Admitting: Internal Medicine

## 2024-08-25 DIAGNOSIS — K219 Gastro-esophageal reflux disease without esophagitis: Secondary | ICD-10-CM | POA: Diagnosis not present

## 2024-08-25 DIAGNOSIS — K7469 Other cirrhosis of liver: Secondary | ICD-10-CM | POA: Diagnosis present

## 2024-08-25 DIAGNOSIS — C9000 Multiple myeloma not having achieved remission: Secondary | ICD-10-CM | POA: Insufficient documentation

## 2024-08-25 DIAGNOSIS — Z7982 Long term (current) use of aspirin: Secondary | ICD-10-CM | POA: Diagnosis not present

## 2024-08-25 DIAGNOSIS — I10 Essential (primary) hypertension: Secondary | ICD-10-CM | POA: Insufficient documentation

## 2024-08-25 DIAGNOSIS — E1151 Type 2 diabetes mellitus with diabetic peripheral angiopathy without gangrene: Secondary | ICD-10-CM | POA: Insufficient documentation

## 2024-08-25 DIAGNOSIS — J45909 Unspecified asthma, uncomplicated: Secondary | ICD-10-CM | POA: Diagnosis not present

## 2024-08-25 DIAGNOSIS — J101 Influenza due to other identified influenza virus with other respiratory manifestations: Secondary | ICD-10-CM | POA: Diagnosis not present

## 2024-08-25 DIAGNOSIS — Z7984 Long term (current) use of oral hypoglycemic drugs: Secondary | ICD-10-CM | POA: Diagnosis not present

## 2024-08-25 DIAGNOSIS — C7951 Secondary malignant neoplasm of bone: Secondary | ICD-10-CM | POA: Insufficient documentation

## 2024-08-25 DIAGNOSIS — Z85828 Personal history of other malignant neoplasm of skin: Secondary | ICD-10-CM | POA: Insufficient documentation

## 2024-08-25 DIAGNOSIS — Z8579 Personal history of other malignant neoplasms of lymphoid, hematopoietic and related tissues: Secondary | ICD-10-CM | POA: Diagnosis not present

## 2024-08-25 DIAGNOSIS — Z85830 Personal history of malignant neoplasm of bone: Secondary | ICD-10-CM | POA: Diagnosis not present

## 2024-08-25 DIAGNOSIS — Z79899 Other long term (current) drug therapy: Secondary | ICD-10-CM | POA: Insufficient documentation

## 2024-08-25 DIAGNOSIS — F419 Anxiety disorder, unspecified: Secondary | ICD-10-CM | POA: Insufficient documentation

## 2024-08-25 DIAGNOSIS — G893 Neoplasm related pain (acute) (chronic): Secondary | ICD-10-CM | POA: Diagnosis not present

## 2024-08-25 DIAGNOSIS — I4891 Unspecified atrial fibrillation: Secondary | ICD-10-CM | POA: Insufficient documentation

## 2024-08-25 DIAGNOSIS — J111 Influenza due to unidentified influenza virus with other respiratory manifestations: Principal | ICD-10-CM

## 2024-08-25 DIAGNOSIS — K7581 Nonalcoholic steatohepatitis (NASH): Secondary | ICD-10-CM | POA: Diagnosis not present

## 2024-08-25 DIAGNOSIS — K7460 Unspecified cirrhosis of liver: Secondary | ICD-10-CM | POA: Diagnosis not present

## 2024-08-25 DIAGNOSIS — E1142 Type 2 diabetes mellitus with diabetic polyneuropathy: Secondary | ICD-10-CM | POA: Diagnosis present

## 2024-08-25 DIAGNOSIS — R0602 Shortness of breath: Secondary | ICD-10-CM | POA: Diagnosis present

## 2024-08-25 DIAGNOSIS — R7989 Other specified abnormal findings of blood chemistry: Secondary | ICD-10-CM

## 2024-08-25 LAB — MULTIPLE MYELOMA PANEL, SERUM
Albumin SerPl Elph-Mcnc: 3.1 g/dL (ref 2.9–4.4)
Albumin/Glob SerPl: 1.3 (ref 0.7–1.7)
Alpha 1: 0.3 g/dL (ref 0.0–0.4)
Alpha2 Glob SerPl Elph-Mcnc: 0.9 g/dL (ref 0.4–1.0)
B-Globulin SerPl Elph-Mcnc: 0.8 g/dL (ref 0.7–1.3)
Gamma Glob SerPl Elph-Mcnc: 0.3 g/dL — ABNORMAL LOW (ref 0.4–1.8)
Globulin, Total: 2.4 g/dL (ref 2.2–3.9)
IgA: 28 mg/dL — ABNORMAL LOW (ref 61–437)
IgG (Immunoglobin G), Serum: 356 mg/dL — ABNORMAL LOW (ref 603–1613)
IgM (Immunoglobulin M), Srm: 10 mg/dL — ABNORMAL LOW (ref 15–143)
M Protein SerPl Elph-Mcnc: 0.1 g/dL — ABNORMAL HIGH
Total Protein ELP: 5.5 g/dL — ABNORMAL LOW (ref 6.0–8.5)

## 2024-08-25 LAB — CBC WITH DIFFERENTIAL/PLATELET
Abs Immature Granulocytes: 0.01 K/uL (ref 0.00–0.07)
Basophils Absolute: 0 K/uL (ref 0.0–0.1)
Basophils Relative: 0 %
Eosinophils Absolute: 0 K/uL (ref 0.0–0.5)
Eosinophils Relative: 1 %
HCT: 45.5 % (ref 39.0–52.0)
Hemoglobin: 15.1 g/dL (ref 13.0–17.0)
Immature Granulocytes: 0 %
Lymphocytes Relative: 13 %
Lymphs Abs: 0.7 K/uL (ref 0.7–4.0)
MCH: 32.5 pg (ref 26.0–34.0)
MCHC: 33.2 g/dL (ref 30.0–36.0)
MCV: 97.8 fL (ref 80.0–100.0)
Monocytes Absolute: 0.7 K/uL (ref 0.1–1.0)
Monocytes Relative: 11 %
Neutro Abs: 4.4 K/uL (ref 1.7–7.7)
Neutrophils Relative %: 75 %
Platelets: 182 K/uL (ref 150–400)
RBC: 4.65 MIL/uL (ref 4.22–5.81)
RDW: 14.7 % (ref 11.5–15.5)
WBC: 5.9 K/uL (ref 4.0–10.5)
nRBC: 0 % (ref 0.0–0.2)

## 2024-08-25 LAB — COMPREHENSIVE METABOLIC PANEL WITH GFR
ALT: 17 U/L (ref 0–44)
AST: 17 U/L (ref 15–41)
Albumin: 3.8 g/dL (ref 3.5–5.0)
Alkaline Phosphatase: 87 U/L (ref 38–126)
Anion gap: 15 (ref 5–15)
BUN: 17 mg/dL (ref 8–23)
CO2: 21 mmol/L — ABNORMAL LOW (ref 22–32)
Calcium: 8.9 mg/dL (ref 8.9–10.3)
Chloride: 106 mmol/L (ref 98–111)
Creatinine, Ser: 0.56 mg/dL — ABNORMAL LOW (ref 0.61–1.24)
GFR, Estimated: >60 mL/min
Glucose, Bld: 112 mg/dL — ABNORMAL HIGH (ref 70–99)
Potassium: 3.5 mmol/L (ref 3.5–5.1)
Sodium: 141 mmol/L (ref 135–145)
Total Bilirubin: 1 mg/dL (ref 0.0–1.2)
Total Protein: 6.6 g/dL (ref 6.5–8.1)

## 2024-08-25 LAB — RESP PANEL BY RT-PCR (RSV, FLU A&B, COVID)  RVPGX2
Influenza A by PCR: POSITIVE — AB
Influenza B by PCR: NEGATIVE
Resp Syncytial Virus by PCR: NEGATIVE
SARS Coronavirus 2 by RT PCR: NEGATIVE

## 2024-08-25 LAB — TROPONIN T, HIGH SENSITIVITY: Troponin T High Sensitivity: <15 ng/L (ref 0–19)

## 2024-08-25 LAB — GLUCOSE, CAPILLARY: Glucose-Capillary: 157 mg/dL — ABNORMAL HIGH (ref 70–99)

## 2024-08-25 LAB — PRO BRAIN NATRIURETIC PEPTIDE: Pro Brain Natriuretic Peptide: 1221 pg/mL — ABNORMAL HIGH

## 2024-08-25 MED ORDER — FAMOTIDINE 20 MG PO TABS
20.0000 mg | ORAL_TABLET | Freq: Every day | ORAL | Status: DC
  Administered 2024-08-25 – 2024-08-27 (×3): 20 mg via ORAL
  Filled 2024-08-25 (×3): qty 1

## 2024-08-25 MED ORDER — ACETAMINOPHEN 500 MG PO TABS: 500.0000 mg | ORAL_TABLET | Freq: Four times a day (QID) | ORAL | Status: DC | PRN

## 2024-08-25 MED ORDER — METHYLPREDNISOLONE SODIUM SUCC 125 MG IJ SOLR
125.0000 mg | Freq: Once | INTRAMUSCULAR | Status: AC
  Administered 2024-08-25: 125 mg via INTRAVENOUS
  Filled 2024-08-25: qty 2

## 2024-08-25 MED ORDER — ACYCLOVIR 400 MG PO TABS
400.0000 mg | ORAL_TABLET | Freq: Two times a day (BID) | ORAL | Status: DC
  Administered 2024-08-25 – 2024-08-28 (×6): 400 mg via ORAL
  Filled 2024-08-25 (×6): qty 1

## 2024-08-25 MED ORDER — ENOXAPARIN SODIUM 80 MG/0.8ML IJ SOSY
80.0000 mg | PREFILLED_SYRINGE | Freq: Two times a day (BID) | INTRAMUSCULAR | Status: DC
  Administered 2024-08-25 – 2024-08-26 (×2): 80 mg via SUBCUTANEOUS
  Filled 2024-08-25 (×2): qty 0.8

## 2024-08-25 MED ORDER — LACTATED RINGERS IV BOLUS
500.0000 mL | Freq: Once | INTRAVENOUS | Status: AC
  Administered 2024-08-25: 500 mL via INTRAVENOUS

## 2024-08-25 MED ORDER — DILTIAZEM HCL 25 MG/5ML IV SOLN
15.0000 mg | Freq: Once | INTRAVENOUS | Status: AC
  Administered 2024-08-25: 15 mg via INTRAVENOUS
  Filled 2024-08-25: qty 5

## 2024-08-25 MED ORDER — INSULIN ASPART 100 UNIT/ML IJ SOLN
0.0000 [IU] | Freq: Three times a day (TID) | INTRAMUSCULAR | Status: DC
  Administered 2024-08-26: 2 [IU] via SUBCUTANEOUS
  Administered 2024-08-26: 3 [IU] via SUBCUTANEOUS
  Administered 2024-08-26: 5 [IU] via SUBCUTANEOUS
  Administered 2024-08-27: 2 [IU] via SUBCUTANEOUS
  Administered 2024-08-27: 3 [IU] via SUBCUTANEOUS
  Administered 2024-08-27 – 2024-08-28 (×2): 2 [IU] via SUBCUTANEOUS
  Filled 2024-08-25: qty 2
  Filled 2024-08-25: qty 3
  Filled 2024-08-25: qty 2
  Filled 2024-08-25: qty 3
  Filled 2024-08-25: qty 2
  Filled 2024-08-25: qty 3
  Filled 2024-08-25: qty 5
  Filled 2024-08-25: qty 1

## 2024-08-25 MED ORDER — LENALIDOMIDE 25 MG PO CAPS: 25.0000 mg | ORAL_CAPSULE | Freq: Every day | ORAL | Status: DC

## 2024-08-25 MED ORDER — ASPIRIN 81 MG PO TBEC
81.0000 mg | DELAYED_RELEASE_TABLET | Freq: Every day | ORAL | Status: DC
  Administered 2024-08-26 – 2024-08-27 (×2): 81 mg via ORAL
  Filled 2024-08-25 (×2): qty 1

## 2024-08-25 MED ORDER — DILTIAZEM HCL 30 MG PO TABS
30.0000 mg | ORAL_TABLET | Freq: Four times a day (QID) | ORAL | Status: AC
  Administered 2024-08-25: 30 mg via ORAL
  Filled 2024-08-25: qty 1

## 2024-08-25 MED ORDER — MAGNESIUM SULFATE 2 GM/50ML IV SOLN
2.0000 g | Freq: Once | INTRAVENOUS | Status: AC
  Administered 2024-08-25: 2 g via INTRAVENOUS
  Filled 2024-08-25: qty 50

## 2024-08-25 MED ORDER — ONDANSETRON HCL 4 MG/2ML IJ SOLN: 4.0000 mg | Freq: Four times a day (QID) | INTRAMUSCULAR | Status: DC | PRN

## 2024-08-25 MED ORDER — ACETAMINOPHEN 650 MG RE SUPP: 325.0000 mg | RECTAL | Status: DC | PRN

## 2024-08-25 MED ORDER — LEVALBUTEROL HCL 1.25 MG/0.5ML IN NEBU
1.2500 mg | INHALATION_SOLUTION | Freq: Three times a day (TID) | RESPIRATORY_TRACT | Status: DC
  Administered 2024-08-25: 1.25 mg via RESPIRATORY_TRACT
  Filled 2024-08-25 (×3): qty 0.5

## 2024-08-25 MED ORDER — POTASSIUM CHLORIDE CRYS ER 20 MEQ PO TBCR
40.0000 meq | EXTENDED_RELEASE_TABLET | Freq: Once | ORAL | Status: AC
  Administered 2024-08-25: 40 meq via ORAL
  Filled 2024-08-25: qty 2

## 2024-08-25 MED ORDER — IPRATROPIUM-ALBUTEROL 0.5-2.5 (3) MG/3ML IN SOLN
3.0000 mL | Freq: Once | RESPIRATORY_TRACT | Status: AC
  Administered 2024-08-25: 3 mL via RESPIRATORY_TRACT
  Filled 2024-08-25: qty 3

## 2024-08-25 MED ORDER — PREDNISONE 20 MG PO TABS
40.0000 mg | ORAL_TABLET | Freq: Every day | ORAL | Status: DC
  Administered 2024-08-26 – 2024-08-28 (×3): 40 mg via ORAL
  Filled 2024-08-25 (×3): qty 2

## 2024-08-25 MED ORDER — LOPERAMIDE HCL 2 MG PO CAPS
2.0000 mg | ORAL_CAPSULE | Freq: Once | ORAL | Status: AC
  Administered 2024-08-25: 2 mg via ORAL
  Filled 2024-08-25: qty 1

## 2024-08-25 MED ORDER — ACETAMINOPHEN 325 MG PO TABS: 650.0000 mg | ORAL_TABLET | Freq: Four times a day (QID) | ORAL | Status: DC | PRN

## 2024-08-25 MED ORDER — APIXABAN 5 MG PO TABS: 5.0000 mg | ORAL_TABLET | Freq: Two times a day (BID) | ORAL | Status: DC

## 2024-08-25 MED ORDER — ACETAMINOPHEN 650 MG RE SUPP: 650.0000 mg | Freq: Four times a day (QID) | RECTAL | Status: DC | PRN

## 2024-08-25 MED ORDER — ONDANSETRON HCL 4 MG PO TABS: 4.0000 mg | ORAL_TABLET | Freq: Four times a day (QID) | ORAL | Status: DC | PRN

## 2024-08-25 MED ORDER — TAMSULOSIN HCL 0.4 MG PO CAPS
0.4000 mg | ORAL_CAPSULE | Freq: Every day | ORAL | Status: DC
  Administered 2024-08-25 – 2024-08-28 (×4): 0.4 mg via ORAL
  Filled 2024-08-25 (×4): qty 1

## 2024-08-25 MED ORDER — ALBUTEROL SULFATE (2.5 MG/3ML) 0.083% IN NEBU
2.5000 mg | INHALATION_SOLUTION | Freq: Once | RESPIRATORY_TRACT | Status: AC
  Administered 2024-08-25: 2.5 mg via RESPIRATORY_TRACT
  Filled 2024-08-25: qty 3

## 2024-08-25 MED ORDER — GUAIFENESIN 100 MG/5ML PO LIQD
5.0000 mL | ORAL | Status: DC | PRN
  Administered 2024-08-25 – 2024-08-26 (×2): 5 mL via ORAL
  Filled 2024-08-25 (×2): qty 10

## 2024-08-25 NOTE — Plan of Care (Incomplete)
" °  Problem: Education: Goal: Knowledge of General Education information will improve Description: Including pain rating scale, medication(s)/side effects and non-pharmacologic comfort measures Outcome: Progressing   Problem: Health Behavior/Discharge Planning: Goal: Ability to manage health-related needs will improve Outcome: Progressing   Problem: Clinical Measurements: Goal: Ability to maintain clinical measurements within normal limits will improve Outcome: Progressing Goal: Will remain free from infection Outcome: Progressing Goal: Diagnostic test results will improve Outcome: Progressing Goal: Respiratory complications will improve Outcome: Progressing Goal: Cardiovascular complication will be avoided Outcome: Progressing   Problem: Activity: Goal: Risk for activity intolerance will decrease Outcome: Progressing   Problem: Nutrition: Goal: Adequate nutrition will be maintained Outcome: Adequate for Discharge   Problem: Coping: Goal: Level of anxiety will decrease Outcome: Adequate for Discharge   "

## 2024-08-25 NOTE — Discharge Instructions (Signed)

## 2024-08-25 NOTE — H&P (Signed)
 " History and Physical    Patient: Joe [name] FMW:[id number] DOB: [birth year] DOA: 08/25/2024 DOS: the patient was seen and examined on 08/25/2024 PCP: [name], MD  Patient coming from: Home  Chief Complaint:  Chief Complaint  Patient presents with   Shortness of Breath   Cough   HPI: Joe [name] is a 73 y.o. 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  No abdominal pain, nausea, emesis, constipation, melena or hematochezia.  No flank pain, dysuria, frequency or hematuria.  No polyuria, polydipsia, polyphagia or blurred vision.   ED course: Initial vital signs were temperature 98.2 F, pulse 92, respiration 28, BP 147/99 and O2 sat 94% on room air.  The patient received LR 500 mL bolus, a DuoNeb, 2.5 mg albuterol  neb, but subsequently had A-fib with RVR in the 110s and received diltiazem  50 mg IVP with his heart rate now in the 80s with A-fib rhythm.  Lab work: CBC was normal.  Positive influenza A PCR.  CMP showed a CO2 of 21 mmol/L with a normal anion gap, glucose 112, BUN 17 and creatinine 0.56 mg/dL, the rest of the CMP measurements were normal.  Troponin was less than 15 ng/L.  proBNP was 1221 pg/mL.  Imaging: Portable 1 view chest radiograph with no acute cardiopulmonary abnormality.  Known right pleural-based lesion and right posterior 7 rib lytic lesion are poorly visualized on this radiograph.   Review of Systems: As mentioned in the history of present illness. All other systems reviewed and are negative. Past Medical History:   Diagnosis Date   Anxiety    Diabetes mellitus without complication (HCC)    No past surgical history on file. Social History:  reports that he has never smoked. He does not have any smokeless tobacco history on file. He reports that he does not drink alcohol and does not use drugs.  Allergies[1]  No family history on file.  Prior to Admission medications  Medication Sig Start Date End Date Taking? Authorizing Provider  acyclovir  (ZOVIRAX ) 400 MG tablet Take 1 tablet (400 mg total) by mouth 2 (two) times daily. 05/20/24   Lanny Callander, MD  aspirin  EC 81 MG tablet Take 81 mg by mouth daily. Swallow whole.    [provider]  calcium  citrate (CALCITRATE - DOSED IN MG ELEMENTAL CALCIUM ) 950 (200 Ca) MG tablet Take 600 mg of elemental calcium  by mouth 3 (three) times daily.    [provider]  cholecalciferol (VITAMIN D3) 25 MCG (1000 UNIT) tablet Take 3,000 Units by mouth daily.    [provider]  dexamethasone  (DECADRON ) 4 MG tablet Take 5 tablets (20 mg total) by mouth once a week. Take on the week on Wednesday without Dara injection 06/11/24   Feng, Yan, MD  diazepam  (VALIUM ) 2 MG tablet Take 2 mg by mouth in the morning and at bedtime. 12/13/15   [provider]  esomeprazole (NEXIUM) 40 MG capsule Take 40 mg by mouth daily before breakfast. 11/26/15   [provider]  famotidine  (PEPCID ) 20 MG tablet Take 20 mg by mouth at bedtime. 11/02/23   [provider]  [facility] ALLERGY  RELIEF 50 MCG/ACT nasal spray Place 1-2 sprays into both nostrils in the morning and at bedtime.    [provider]  JARDIANCE  10 MG TABS tablet Take 10 mg by mouth daily.    [provider]  lenalidomide  (REVLIMID ) 25 MG capsule Take 1 capsule (25 mg total) by mouth daily. For 21 days and off for 7 days for a 28 day cycle.  [employer] Auth #[id number]     Date Obtained 08/06/2024 08/06/24   Lanny Callander, MD  Multiple Vitamin (MULTIVITAMIN) tablet Take 1 tablet  by mouth daily with breakfast.    [provider]  ondansetron  (ZOFRAN ) 8 MG tablet Take 1 tablet (8 mg total) by mouth every 8 (eight) hours as needed for nausea or vomiting. 04/23/24   Lanny Callander, MD  [facility] VERIO test strip  12/26/15   [provider]  oxyCODONE  (OXY IR/ROXICODONE ) 5 MG immediate release tablet Take 1 tablet (5 mg total) by mouth every 8 (eight) hours as needed for severe pain (pain score 7-10). 08/06/24   Pickenpack-Cousar, Athena N, NP  rosuvastatin  (CRESTOR ) 10 MG tablet Take 10 mg by mouth daily. 02/01/16   [provider]  sildenafil (VIAGRA) 100 MG tablet Take 100 mg by mouth daily as needed (for E.D.).    [provider]  tamsulosin  (FLOMAX ) 0.4 MG CAPS capsule 1 capsule. 06/10/24   [provider]  Turmeric (QC TUMERIC COMPLEX) 500 MG CAPS Take 4,800 capsules by mouth 2 (two) times daily.    [provider]    Physical Exam: Vitals:   08/25/24 0945 08/25/24 0946 08/25/24 1000 08/25/24 1156  BP: (!) 147/99 (!) 147/99 (!) 133/55 (!) 143/84  Pulse: 92 93 (!) 49 (!) 101  Resp:  (!) 28  (!) 25  Temp:  98.2 F (36.8 C)  98 F (36.7 C)  TempSrc:  Oral  Oral  SpO2: 94% 94% 94% 91%   Physical Exam Vitals and nursing note reviewed.  Constitutional:      General: He is awake. He is not in acute distress.    Appearance: He is well-developed. He is ill-appearing.  HENT:     Head: Normocephalic.     Nose: No rhinorrhea.  Eyes:     General: No scleral icterus.    Pupils: Pupils are equal, round, and reactive to light.  Neck:     Vascular: No JVD.  Cardiovascular:     Rate and Rhythm: Tachycardia present. Rhythm irregular.     Heart sounds: S1 normal and S2 normal.  Pulmonary:     Effort: No respiratory distress.     Breath sounds: Decreased breath sounds, wheezing and rhonchi present. No rales.  Abdominal:     General: Bowel sounds are normal. There is no distension.     Palpations: Abdomen is soft.      Tenderness: There is no abdominal tenderness. There is no right CVA tenderness or left CVA tenderness.  Musculoskeletal:     Cervical back: Neck supple.     Right lower leg: No edema.     Left lower leg: No edema.  Skin:    General: Skin is warm and dry.  Neurological:     General: No focal deficit present.     Mental Status: He is alert and oriented to person, place, and time.  Psychiatric:        Mood and Affect: Mood normal.        Behavior: Behavior normal. Behavior is cooperative.  Data Reviewed:  Results are pending, will review when available.   EKG: Vent. rate 51 BPM  PR interval 184 ms  QRS duration 98 ms  QT/QTcB 437/403 ms  P-R-T axes 68 -24 31  Sinus rhythm  Consider left atrial enlargement  S1,S2,S3 pattern  Abnormal R-wave progression, late transition  Assessment and Plan: Principal Problem:   Reactive airway disease Due to:   Influenza A Observation/telemetry Continue supplemental oxygen. Methylprednisolone  125 mg IVP x1. Followed by prednisone  40 mg p.o. daily in a.m. Scheduled and as needed bronchodilators. Follow-up CBC and chemistry in the morning.   Active Problems:   Atrial fibrillation with RVR (HCC)  CHA?DS?-VASc Score of at least 5. (No stroke/TIA/thromboembolism and no CHF). Optimize electrolytes. Switched albuterol  to Xopenex . Obtain echocardiogram. Cardiology consult requested.    Hypertension, essential Monitor blood pressure.    Liver cirrhosis secondary to NASH (HCC) LFTs are normal. Continue to monitor periodically.    Type 2 diabetes mellitus with peripheral neuropathy (HCC) Carbohydrate modified diet. CBG monitoring with RI SS while in the hospital. Check hemoglobin A1c.    Multiple myeloma (HCC) Associated with:   Metastasis to bone (HCC) Analgesics as needed. Continue lenalidomide  25 mg p.o. daily. Follow-up with oncology as scheduled.    Gastro-esophageal reflux disease without esophagitis Continue  famotidine  at bedtime.    Advance Care Planning:   Code Status: Full Code   Consults: Cardiology consult requested for a.m.  Family Communication:   Severity of Illness: The appropriate patient status for this patient is OBSERVATION. Observation status is judged to be reasonable and necessary in order to provide the required intensity of service to ensure the patient's safety. The patient's presenting symptoms, physical exam findings, and initial radiographic and laboratory data in the context of their medical condition is felt to place them at decreased risk for further clinical deterioration. Furthermore, it is anticipated that the patient will be medically stable for discharge from the hospital within 2 midnights of admission.   Author: Alm Dorn Castor, MD 08/25/2024 2:08 PM  For on call review [email or web address].   This document was prepared using Dragon voice recognition software and may contain some unintended transcription errors.     [1] No Known Allergies  "

## 2024-08-25 NOTE — ED Triage Notes (Signed)
 Pt reports flulike sx x3 days, having increased shob and productive cough. Audibly gunky in triage. Poor PO intake, had diarrhea one day. Last tx 12/24.

## 2024-08-25 NOTE — Telephone Encounter (Signed)
 Patient Product/process development scientist completed.    The patient is insured through [employer]. Patient has Medicare and is not eligible for a copay card, but may be able to apply for patient assistance or Medicare RX Payment Plan (Patient Must reach out to their plan, if eligible for payment plan), if available.    Ran test claim for Eliquis  5 mg and the current 30 day co-pay is $0.00.   This test claim was processed through [facility]- copay amounts may vary at other pharmacies due to pharmacy/plan contracts, or as the patient moves through the different stages of their insurance plan.     Reyes Sharps, CPHT Pharmacy Technician Patient Advocate Specialist Lead [facility] Patient Advocate Team Direct Number: [phone]  Fax: [phone]

## 2024-08-25 NOTE — ED Notes (Signed)
 Pt was able to ambulate and walk the hallways, pt did state that he felt that he was SOB during the walk. DO notified.

## 2024-08-25 NOTE — Progress Notes (Signed)
 PHARMACY - ANTICOAGULATION CONSULT NOTE  Pharmacy Consult for enoxaparin  Indication: atrial fibrillation  Allergies[1]  Patient Measurements:    Vital Signs: Temp: 98.3 F (36.8 C) (12/29 1700) Temp Source: Oral (12/29 1700) BP: 128/77 (12/29 1700) Pulse Rate: 63 (12/29 1700)  Labs: Recent Labs    08/25/24 1005  HGB 15.1  HCT 45.5  PLT 182  CREATININE 0.56*    Estimated Creatinine Clearance: 82.2 mL/min (A) (by C-G formula based on SCr of 0.56 mg/dL (L)).   Medical History: Past Medical History:  Diagnosis Date   Anxiety    Diabetes mellitus without complication (HCC)    Multiple myeloma (HCC) 04/08/2024     Assessment: 73 year old male presented with flu-like symptoms, noted shortness of breath and cough. He was also found to be in atrial fibrillation. Pharmacy consulted to dose enoxaparin .  Plan:  -Enoxaparin  80 mg (~ 1 mg/kg) subcu BID -Continue to follow for plan of care and long term anticoagulation if indicated  Stefano MARLA Bologna, PharmD, BCPS Clinical Pharmacist 08/25/2024 5:18 PM       [1] No Known Allergies

## 2024-08-25 NOTE — ED Provider Notes (Signed)
 " [place] EMERGENCY DEPARTMENT AT [facility] Provider Note   CSN: [id number] Arrival date & time: 08/25/24  9058     Patient presents with: Shortness of Breath and Cough   Joe [name] is a 73 y.o. male.   73 year old male presenting emergency department with generalized malaise, congestion, productive cough x 3 days.  No nausea vomiting or diarrhea.  Having severe shortness of breath which is brought him in today.  Dyspnea on exertion.  Having some orthopnea as well.  No lower extremity edema.   Shortness of Breath Associated symptoms: cough   Cough Associated symptoms: shortness of breath        Prior to Admission medications  Medication Sig Start Date End Date Taking? Authorizing Provider  acyclovir  (ZOVIRAX ) 400 MG tablet Take 1 tablet (400 mg total) by mouth 2 (two) times daily. 05/20/24   Lanny Callander, MD  aspirin  EC 81 MG tablet Take 81 mg by mouth daily. Swallow whole.    [provider]  calcium  citrate (CALCITRATE - DOSED IN MG ELEMENTAL CALCIUM ) 950 (200 Ca) MG tablet Take 600 mg of elemental calcium  by mouth 3 (three) times daily.    [provider]  cholecalciferol (VITAMIN D3) 25 MCG (1000 UNIT) tablet Take 3,000 Units by mouth daily.    [provider]  dexamethasone  (DECADRON ) 4 MG tablet Take 5 tablets (20 mg total) by mouth once a week. Take on the week on Wednesday without Dara injection 06/11/24   Feng, Yan, MD  diazepam  (VALIUM ) 2 MG tablet Take 2 mg by mouth in the morning and at bedtime. 12/13/15   [provider]  esomeprazole (NEXIUM) 40 MG capsule Take 40 mg by mouth daily before breakfast. 11/26/15   [provider]  famotidine  (PEPCID ) 20 MG tablet Take 20 mg by mouth at bedtime. 11/02/23   [provider]  FLONASE ALLERGY RELIEF 50 MCG/ACT nasal spray Place 1-2 sprays into both nostrils in the morning and at bedtime.    [provider]  JARDIANCE  10 MG TABS tablet Take 10 mg by mouth  daily.    [provider]  lenalidomide  (REVLIMID ) 25 MG capsule Take 1 capsule (25 mg total) by mouth daily. For 21 days and off for 7 days for a 28 day cycle.  [employer] Auth #[id number]     Date Obtained 08/06/2024 08/06/24   Lanny Callander, MD  Multiple Vitamin (MULTIVITAMIN) tablet Take 1 tablet by mouth daily with breakfast.    [provider]  ondansetron  (ZOFRAN ) 8 MG tablet Take 1 tablet (8 mg total) by mouth every 8 (eight) hours as needed for nausea or vomiting. 04/23/24   Lanny Callander, MD  [facility] VERIO test strip  12/26/15   [provider]  oxyCODONE  (OXY IR/ROXICODONE ) 5 MG immediate release tablet Take 1 tablet (5 mg total) by mouth every 8 (eight) hours as needed for severe pain (pain score 7-10). 08/06/24   Pickenpack-Cousar, Athena N, NP  rosuvastatin  (CRESTOR ) 10 MG tablet Take 10 mg by mouth daily. 02/01/16   [provider]  sildenafil (VIAGRA) 100 MG tablet Take 100 mg by mouth daily as needed (for E.D.).    [provider]  tamsulosin  (FLOMAX ) 0.4 MG CAPS capsule 1 capsule. 06/10/24   [provider]  Turmeric (QC TUMERIC COMPLEX) 500 MG CAPS Take 4,800 capsules by mouth 2 (two) times daily.    [provider]    Allergies: Patient has no known allergies.    Review  of Systems  Respiratory:  Positive for cough and shortness of breath.     Updated Vital Signs BP (!) 143/84 (BP Location: Left Arm)   Pulse (!) 101   Temp 98 F (36.7 C) (Oral)   Resp (!) 25   SpO2 91%   Physical Exam Vitals reviewed.  Constitutional:      General: He is not in acute distress.    Appearance: He is not toxic-appearing.  HENT:     Head: Normocephalic.  Cardiovascular:     Rate and Rhythm: Tachycardia present. Rhythm irregular.  Pulmonary:     Effort: Tachypnea and accessory muscle usage present.     Breath sounds: Wheezing, rhonchi and rales present.  Musculoskeletal:     Cervical back: Normal range of motion.     Right lower leg:  No edema.     Left lower leg: No edema.  Skin:    General: Skin is warm.     Capillary Refill: Capillary refill takes less than 2 seconds.  Neurological:     Mental Status: He is alert and oriented to person, place, and time.  Psychiatric:        Mood and Affect: Mood normal.        Behavior: Behavior normal.     (all labs ordered are listed, but only abnormal results are displayed) Labs Reviewed  RESP PANEL BY RT-PCR (RSV, FLU A&B, COVID)  RVPGX2 - Abnormal; Notable for the following components:      Result Value   Influenza A by PCR POSITIVE (*)    All other components within normal limits  COMPREHENSIVE METABOLIC PANEL WITH GFR - Abnormal; Notable for the following components:   CO2 21 (*)    Glucose, Bld 112 (*)    Creatinine, Ser 0.56 (*)    All other components within normal limits  PRO BRAIN NATRIURETIC PEPTIDE - Abnormal; Notable for the following components:   Pro Brain Natriuretic Peptide 1,221.0 (*)    All other components within normal limits  CBC WITH DIFFERENTIAL/PLATELET  TROPONIN T, HIGH SENSITIVITY    EKG: EKG Interpretation Date/Time:  Monday August 25 2024 10:31:50 EST Ventricular Rate:  51 PR Interval:  184 QRS Duration:  98 QT Interval:  437 QTC Calculation: 403 R Axis:   -24  Text Interpretation: Sinus rhythm Consider left atrial enlargement S1,S2,S3 pattern Abnormal R-wave progression, late transition Confirmed by [facility], : ([id number]), editor Florene Kay [phone]) on 08/25/2024 10:55:56 AM  Radiology: DG Chest Portable 1 View Result Date: 08/25/2024 EXAM: 1 VIEW(S) XRAY OF THE CHEST 08/25/2024 10:17:00 AM COMPARISON: Chest x-ray 03/24/2024, PET CTA 22 25. CLINICAL HISTORY: cough, sob FINDINGS: LUNGS AND PLEURA: Poorly visualized known pleural based lesion in right hemithorax laterally. No pleural effusion. No pneumothorax. HEART AND MEDIASTINUM: No acute abnormality of the cardiac and mediastinal silhouettes. Atherosclerotic plaque. BONES  AND SOFT TISSUES: Chronic left lateral eighth rib fracture. Known poorly visualized Lytic lesion in right posterior seventh rib. IMPRESSION: 1. No acute cardiopulmonary abnormality. 2. Known right pleural-based lesion and right posterior seventh rib lytic lesion are poorly visualized on this radiograph. Electronically signed by: Morgane Naveau MD 08/25/2024 12:27 PM EST RP Workstation: [id number]     Procedures   Medications Ordered in the ED  ipratropium-albuterol  (DUONEB) 0.5-2.5 (3) MG/3ML nebulizer solution 3 mL (has no administration in time range)  albuterol  (PROVENTIL ) (2.5 MG/3ML) 0.083% nebulizer solution 2.5 mg (2.5 mg Nebulization Given 08/25/24 1058)  lactated ringers  bolus 500 mL (0 mLs Intravenous Stopped 08/25/24  1243)  diltiazem  (CARDIZEM ) injection 15 mg (15 mg Intravenous Given 08/25/24 1250)    Clinical Course as of 08/25/24 1407  Mon Aug 25, 2024  1024 Chemo meds:  Patient is on Treatment Plan : MYELOMA NEWLY DIAGNOSED Daratumumab  IV + Lenalidomide  + Dexamethasone  Weekly (DaraRd) q28d   [TY]  1112 Influenza A By PCR(!): POSITIVE [TY]  1156 Rhythm change.  Appears to be in A-fib currently.  Rate in the low 100s.  He states he feels significantly improved in terms of his shortness of breath after his albuterol  breathing treatment. [TY]  1258 Chadsvasc of 4. Hasbled 2 [TY]  1300 Heart rate in the 80s after diltiazem .  Reports continued improvement in his shortness of breath.   [TY]  1355 DG Chest Portable 1 View IMPRESSION: 1. No acute cardiopulmonary abnormality. 2. Known right pleural-based lesion and right posterior seventh rib lytic lesion are poorly visualized on this radiograph.   [TY]  1355 Patient attempted to ambulate.  Did not desaturate past 90%, however had significant increase in his work of breathing and shortness of breath.  Was speaking in short sentences.  Given his age and comorbidities and severe dyspnea on exertion with new onset A-fib with elevated  BNP will admit for further management. [TY]  1407 Discussed with Dr. Celinda who will see the patient. [TY]    Clinical Course User Index [TY] Neysa Caron PARAS, DO                                 Medical Decision Making This is a 73 year old male presenting emergency department with flulike symptoms also having shortness of breath.  He is afebrile initially not tachycardic, but then converted into A-fib heart rate in the 110s or so.  Remained hemodynamically stable.  Seem to be in moderate respiratory distress with increased work of breathing on arrival.  Wheezing noted on exam.  Clinically euvolemic.  Having URI symptoms, trialed DuoNebs with great improvement of his shortness of breath.  Chest x-ray per my independent review no pneumonia pneumothorax.  Radiology with slight pleural-based lesion, is troponin not elevated.  BNP elevated at 1200, no prior to compare to.  His overall symptoms seem to be consistent with viral etiology.  He is flu positive.  Comprehensive panel with no significant metabolic derangements.  Normal kidney function no transaminitis.  He has no leukocytosis to suggest infection.  Given his A-fib RVR given a dose of diltiazem .  Heart rate in the 80s.  See records of prior provocative cardiac testing.  See ED course for further MDM final disposition.  Amount and/or Complexity of Data Reviewed Independent Historian:     Details: Family member notes no cardiac history External Data Reviewed:     Details: See above.  Do not have records of prior cardiac testing Labs:  Decision-making details documented in ED Course. Radiology: independent interpretation performed. Decision-making details documented in ED Course. ECG/medicine tests: ordered and independent interpretation performed.    Details: Initial EKG sinus.  Subsequent EKG A-fib  Risk Prescription drug management. Decision regarding hospitalization. Diagnosis or treatment significantly limited by social determinants of  health. Risk Details: Poor health literacy       Final diagnoses:  Influenza  Elevated brain natriuretic peptide (BNP) level    ED Discharge Orders     None          Neysa Caron PARAS, DO 08/25/24 1401  "

## 2024-08-25 NOTE — Progress Notes (Signed)
 Prior-To-Admission Oral Chemotherapy for Treatment of Oncologic Disease   Order noted from Dr. Celinda to continue prior-to-admission oral chemotherapy regimen of lenalidomide  25mg .  Procedure Per Pharmacy & Therapeutics Committee Policy: Orders for continuation of home oral chemotherapy for treatment of an oncologic disease will be held unless approved by an oncologist during current admission.    For patients receiving oncology care at [facility], inpatient pharmacist contacts patient's oncologist during regular office hours to review. If earlier review is medically necessary, attending physician consults [facility] on-call oncologist   For patients receiving oncology care outside of [facility], attending physician consults patient's oncologist to review. If this oncologist or their coverage cannot be reached, attending physician consults [facility] on-call oncologist   Oral chemotherapy continuation order is on hold pending oncologist review, [facility] oncologist Lanny will be notified by inpatient pharmacy during office hours      Rosaline Millet PharmD 08/25/2024, 8:27 PM

## 2024-08-26 ENCOUNTER — Observation Stay (HOSPITAL_BASED_OUTPATIENT_CLINIC_OR_DEPARTMENT_OTHER)

## 2024-08-26 DIAGNOSIS — J45909 Unspecified asthma, uncomplicated: Secondary | ICD-10-CM | POA: Diagnosis not present

## 2024-08-26 DIAGNOSIS — K7460 Unspecified cirrhosis of liver: Secondary | ICD-10-CM | POA: Diagnosis not present

## 2024-08-26 DIAGNOSIS — G893 Neoplasm related pain (acute) (chronic): Secondary | ICD-10-CM | POA: Diagnosis not present

## 2024-08-26 DIAGNOSIS — K219 Gastro-esophageal reflux disease without esophagitis: Secondary | ICD-10-CM | POA: Diagnosis not present

## 2024-08-26 DIAGNOSIS — I480 Paroxysmal atrial fibrillation: Secondary | ICD-10-CM | POA: Diagnosis not present

## 2024-08-26 DIAGNOSIS — Z85828 Personal history of other malignant neoplasm of skin: Secondary | ICD-10-CM | POA: Diagnosis not present

## 2024-08-26 DIAGNOSIS — J101 Influenza due to other identified influenza virus with other respiratory manifestations: Secondary | ICD-10-CM | POA: Diagnosis not present

## 2024-08-26 DIAGNOSIS — I4891 Unspecified atrial fibrillation: Secondary | ICD-10-CM

## 2024-08-26 DIAGNOSIS — E1151 Type 2 diabetes mellitus with diabetic peripheral angiopathy without gangrene: Secondary | ICD-10-CM | POA: Diagnosis not present

## 2024-08-26 DIAGNOSIS — Z8579 Personal history of other malignant neoplasms of lymphoid, hematopoietic and related tissues: Secondary | ICD-10-CM | POA: Diagnosis not present

## 2024-08-26 DIAGNOSIS — C7951 Secondary malignant neoplasm of bone: Secondary | ICD-10-CM | POA: Diagnosis not present

## 2024-08-26 DIAGNOSIS — I10 Essential (primary) hypertension: Secondary | ICD-10-CM | POA: Diagnosis not present

## 2024-08-26 DIAGNOSIS — K7581 Nonalcoholic steatohepatitis (NASH): Secondary | ICD-10-CM | POA: Diagnosis not present

## 2024-08-26 DIAGNOSIS — C9000 Multiple myeloma not having achieved remission: Secondary | ICD-10-CM | POA: Diagnosis not present

## 2024-08-26 LAB — COMPREHENSIVE METABOLIC PANEL WITH GFR
ALT: 15 U/L (ref 0–44)
AST: 17 U/L (ref 15–41)
Albumin: 3.6 g/dL (ref 3.5–5.0)
Alkaline Phosphatase: 79 U/L (ref 38–126)
Anion gap: 15 (ref 5–15)
BUN: 23 mg/dL (ref 8–23)
CO2: 21 mmol/L — ABNORMAL LOW (ref 22–32)
Calcium: 8.7 mg/dL — ABNORMAL LOW (ref 8.9–10.3)
Chloride: 108 mmol/L (ref 98–111)
Creatinine, Ser: 0.59 mg/dL — ABNORMAL LOW (ref 0.61–1.24)
GFR, Estimated: >60 mL/min
Glucose, Bld: 150 mg/dL — ABNORMAL HIGH (ref 70–99)
Potassium: 4.4 mmol/L (ref 3.5–5.1)
Sodium: 144 mmol/L (ref 135–145)
Total Bilirubin: 0.7 mg/dL (ref 0.0–1.2)
Total Protein: 6.2 g/dL — ABNORMAL LOW (ref 6.5–8.1)

## 2024-08-26 LAB — ECHOCARDIOGRAM COMPLETE
AR max vel: 2.39 cm2
AV Area VTI: 2.47 cm2
AV Area mean vel: 2.52 cm2
AV Mean grad: 6 mmHg
AV Peak grad: 16.5 mmHg
Ao pk vel: 2.03 m/s
Area-P 1/2: 2.32 cm2
Calc EF: 62.2 %
Height: 67 in
S' Lateral: 3.1 cm
Single Plane A2C EF: 57.1 %
Single Plane A4C EF: 65.1 %
Weight: 2752 [oz_av]

## 2024-08-26 LAB — GLUCOSE, CAPILLARY
Glucose-Capillary: 173 mg/dL — ABNORMAL HIGH (ref 70–99)
Glucose-Capillary: 122 mg/dL — ABNORMAL HIGH (ref 70–99)
Glucose-Capillary: 203 mg/dL — ABNORMAL HIGH (ref 70–99)
Glucose-Capillary: 178 mg/dL — ABNORMAL HIGH (ref 70–99)

## 2024-08-26 LAB — CBC
HCT: 42.4 % (ref 39.0–52.0)
Hemoglobin: 14 g/dL (ref 13.0–17.0)
MCH: 32.3 pg (ref 26.0–34.0)
MCHC: 33 g/dL (ref 30.0–36.0)
MCV: 97.9 fL (ref 80.0–100.0)
Platelets: 177 K/uL (ref 150–400)
RBC: 4.33 MIL/uL (ref 4.22–5.81)
RDW: 14.6 % (ref 11.5–15.5)
WBC: 2.8 K/uL — ABNORMAL LOW (ref 4.0–10.5)
nRBC: 0 % (ref 0.0–0.2)

## 2024-08-26 LAB — HEMOGLOBIN A1C
Hgb A1c MFr Bld: 5.8 % — ABNORMAL HIGH (ref 4.8–5.6)
Mean Plasma Glucose: 119.76 mg/dL

## 2024-08-26 LAB — MAGNESIUM: Magnesium: 3 mg/dL — ABNORMAL HIGH (ref 1.7–2.4)

## 2024-08-26 MED ORDER — EMPAGLIFLOZIN 10 MG PO TABS
10.0000 mg | ORAL_TABLET | Freq: Every day | ORAL | Status: DC
  Administered 2024-08-26 – 2024-08-28 (×3): 10 mg via ORAL
  Filled 2024-08-26 (×3): qty 1

## 2024-08-26 MED ORDER — ROSUVASTATIN CALCIUM 10 MG PO TABS
10.0000 mg | ORAL_TABLET | Freq: Every day | ORAL | Status: DC
  Administered 2024-08-26 – 2024-08-28 (×3): 10 mg via ORAL
  Filled 2024-08-26 (×3): qty 1

## 2024-08-26 MED ORDER — OSELTAMIVIR PHOSPHATE 75 MG PO CAPS
75.0000 mg | ORAL_CAPSULE | Freq: Two times a day (BID) | ORAL | Status: DC
  Administered 2024-08-26 – 2024-08-28 (×5): 75 mg via ORAL
  Filled 2024-08-26 (×5): qty 1

## 2024-08-26 MED ORDER — HYDROCOD POLI-CHLORPHE POLI ER 10-8 MG/5ML PO SUER
5.0000 mL | Freq: Two times a day (BID) | ORAL | Status: DC | PRN
  Administered 2024-08-27: 5 mL via ORAL
  Filled 2024-08-26: qty 115

## 2024-08-26 MED ORDER — LEVALBUTEROL HCL 1.25 MG/0.5ML IN NEBU: 1.2500 mg | INHALATION_SOLUTION | Freq: Four times a day (QID) | RESPIRATORY_TRACT | Status: DC | PRN

## 2024-08-26 MED ORDER — GUAIFENESIN ER 600 MG PO TB12
1200.0000 mg | ORAL_TABLET | Freq: Two times a day (BID) | ORAL | Status: DC
  Administered 2024-08-26 – 2024-08-28 (×5): 1200 mg via ORAL
  Filled 2024-08-26 (×5): qty 2

## 2024-08-26 MED ORDER — LEVALBUTEROL HCL 1.25 MG/0.5ML IN NEBU
1.2500 mg | INHALATION_SOLUTION | Freq: Four times a day (QID) | RESPIRATORY_TRACT | Status: DC
  Administered 2024-08-26 (×2): 1.25 mg via RESPIRATORY_TRACT
  Filled 2024-08-26 (×4): qty 0.5

## 2024-08-26 MED ORDER — MELATONIN 3 MG PO TABS
3.0000 mg | ORAL_TABLET | Freq: Every day | ORAL | Status: DC
  Administered 2024-08-26 – 2024-08-27 (×2): 3 mg via ORAL
  Filled 2024-08-26 (×2): qty 1

## 2024-08-26 MED ORDER — APIXABAN 5 MG PO TABS
5.0000 mg | ORAL_TABLET | Freq: Two times a day (BID) | ORAL | Status: DC
  Administered 2024-08-26 – 2024-08-28 (×4): 5 mg via ORAL
  Filled 2024-08-26 (×4): qty 1

## 2024-08-26 MED ORDER — DILTIAZEM HCL 30 MG PO TABS
30.0000 mg | ORAL_TABLET | Freq: Four times a day (QID) | ORAL | Status: DC
  Administered 2024-08-26 – 2024-08-27 (×4): 30 mg via ORAL
  Filled 2024-08-26 (×4): qty 1

## 2024-08-26 NOTE — TOC Transition Note (Signed)
 Transition of Care [facility]) - Discharge Note   Patient Details  Name: Joe [name] MRN: [id number] Date of Birth: March 07, 1951  Transition of [facility] CM/SW Contact:  Bascom Service, RN Phone Number: 08/26/2024, 12:04 PM   Clinical Narrative: d/c plan home. No CM needs.      Final next level of care: Home/Self Care Barriers to Discharge: No Barriers Identified   Patient Goals and CMS Choice Patient states their goals for this hospitalization and ongoing recovery are:: home CMS Medicare.gov Compare Post Acute Care list provided to:: Patient Choice offered to / list presented to : Patient [place] ownership interest in [facility].provided to:: Patient    Discharge Placement                       Discharge Plan and Services Additional resources added to the After Visit Summary for                                       Social Drivers of Health (SDOH) Interventions SDOH Screenings   Food Insecurity: No Food Insecurity (08/25/2024)  Housing: Low Risk (08/25/2024)  Transportation Needs: No Transportation Needs (08/25/2024)  Utilities: Not At Risk (08/25/2024)  Depression (PHQ2-9): Low Risk (08/18/2024)  Social Connections: Socially Isolated (08/25/2024)  Tobacco Use: Unknown (08/25/2024)     Readmission Risk Interventions    03/29/2024    1:27 PM  Readmission Risk Prevention Plan  Post Dischage Appt Complete  Medication Screening Complete  Transportation Screening Complete

## 2024-08-26 NOTE — Progress Notes (Signed)
 Prior-To-Admission Oral Chemotherapy for Treatment of Oncologic Disease   Order noted from Dr. Celinda to continue prior-to-admission oral chemotherapy regimen of lenalidomide .  Procedure Per Pharmacy & Therapeutics Committee Policy: Orders for continuation of home oral chemotherapy for treatment of an oncologic disease will be held unless approved by an oncologist during current admission.    For patients receiving oncology care at [facility], inpatient pharmacist contacts patient's oncologist during regular office hours to review. If earlier review is medically necessary, attending physician consults [facility] on-call oncologist   For patients receiving oncology care outside of [facility], attending physician consults patient's oncologist to review. If this oncologist or their coverage cannot be reached, attending physician consults [facility] on-call oncologist   Dr Lonn contacted for review and gave telephone order to hold lenalidomide  while patient is admitted to hospital.     Eleanor Agent, PharmD, BCPS 08/26/2024, 9:32 AM

## 2024-08-26 NOTE — Care Management Obs Status (Signed)
 MEDICARE OBSERVATION STATUS NOTIFICATION   Patient Details  Name: Joe [name] MRN: [id number] Date of Birth: [birth year]   Medicare Observation Status Notification Given:  Yes    MahabirNathanel, RN 08/26/2024, 11:49 AM

## 2024-08-26 NOTE — Progress Notes (Signed)
 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  Patient was found to be positive for influenza A, proBNP 1200, chest x-ray with no acute cardiopulmonary abnormality.  Known right pleural-based lesion and right posterior second rib lytic lesion are poorly visualized on this film.     Assessment & Plan:   Principal Problem:   Influenza A Active Problems:   Hypertension, essential   Liver cirrhosis secondary to NASH (HCC)   Type 2 diabetes mellitus with peripheral neuropathy (HCC)   Multiple myeloma (HCC)   Gastro-esophageal reflux disease without esophagitis   Metastasis to bone (HCC)   Reactive airway disease   Atrial fibrillation with RVR (HCC)  1-Influenza A Acute bronchitis reactive airway disease: - Patient presents with runny nose cough and dyspnea for the last 3 days. - Plan to start Tamiflu  for 5 days - On prednisone  for acute bronchitis continue -Continue breathing treatments  A-fib with RVR - He was initially started on Cardizem  drip subsequently stopped due to pauses.  Cardiology consulted and recommended low-dose oral Cardizem  and continue to monitor on telemetry - Started on Eliquis  -ECHO  Hypertension - Started on Cardizem   Cirrhosis of the liver NASH - Liver function test stable  Diabetes type 2 with peripheral neuropathy - Continue sliding scale insulin  - Monitor CBG on steroids - Is on Jardiance . Continue to hold Ozempic  Multiple myeloma, metastasis to bone - Count stable, renal function stable.  Follow-up with Dr.  Lanny. Follow-up as an outpatient Holding Revlimid  until he is seen again by oncologist. Discussed with Dr Lonn   GERD: - Continue Pepcid       Estimated body mass index is 26.94 kg/m as calculated from the following:   Height as of this encounter: 5' 7 (1.702 m).   Weight as of this encounter: 78 kg.   DVT prophylaxis: Eliquis  Code Status: Full code Family Communication: Family who was at bedside Disposition Plan:  Status is: Observation The patient remains OBS appropriate and will d/c before 2 midnights.    Consultants:  Cardiology  Procedures:  ECHO Antimicrobials:    Subjective: He is feeling better, still having cough, was having a lot of congestion.  He feels his heart rate goes up to 140s  Objective: Vitals:   08/25/24 1829 08/25/24 2118 08/26/24 0057 08/26/24 0506  BP: 134/81 (!) 147/70 134/65 131/68  Pulse: 97 74 (!) 55 (!) 56  Resp:  20 19 20   Temp: 100.2 F (37.9 C) 98 F (36.7 C) 98.3 F (36.8 C) 98.3 F (36.8 C)  TempSrc: Oral Oral Oral Oral  SpO2: 93% 96% 93% 94%  Weight:      Height:        Intake/Output Summary (Last 24 hours) at 08/26/2024 0743 Last data filed at 08/25/2024 1745 Gross per 24 hour  Intake 502.08 ml  Output --  Net 502.08 ml   Filed Weights   08/25/24 1715  Weight: 78 kg    Examination:  General exam: Appears calm and comfortable  Respiratory system: BL [place],  Respiratory effort normal. Cardiovascular system: S1 & S2 heard, IRR Gastrointestinal system: Abdomen is nondistended,  soft and nontender. No organomegaly or masses felt. Normal bowel sounds heard. Central nervous system: Alert and oriented. Extremities: Symmetric 5 x 5 power.    Data Reviewed: I have personally reviewed following labs and imaging studies  CBC: Recent Labs  Lab 08/25/24 1005 08/26/24 0523  WBC 5.9 2.8*  NEUTROABS 4.4  --   HGB 15.1 14.0  HCT 45.5 42.4  MCV 97.8 97.9  PLT 182 177   Basic Metabolic Panel: Recent Labs   Lab 08/25/24 1005  NA 141  K 3.5  CL 106  CO2 21*  GLUCOSE 112*  BUN 17  CREATININE 0.56*  CALCIUM  8.9   GFR: Estimated Creatinine Clearance: 74.6 mL/min (A) (by C-G formula based on SCr of 0.56 mg/dL (L)). Liver Function Tests: Recent Labs  Lab 08/25/24 1005  AST 17  ALT 17  ALKPHOS 87  BILITOT 1.0  PROT 6.6  ALBUMIN 3.8   No results for input(s): LIPASE, AMYLASE in the last 168 hours. No results for input(s): AMMONIA in the last 168 hours. Coagulation Profile: No results for input(s): INR, PROTIME in the last 168 hours. Cardiac Enzymes: No results for input(s): CKTOTAL, CKMB, CKMBINDEX, TROPONINI in the last 168 hours. BNP (last 3 results) Recent Labs    08/25/24 1126  PROBNP 1,221.0*   HbA1C: No results for input(s): HGBA1C in the last 72 hours. CBG: Recent Labs  Lab 08/25/24 2116 08/26/24 0716  GLUCAP 157* 173*   Lipid Profile: No results for input(s): CHOL, HDL, LDLCALC, TRIG, CHOLHDL, LDLDIRECT in the last 72 hours. Thyroid  Function Tests: No results for input(s): TSH, T4TOTAL, FREET4, T3FREE, THYROIDAB in the last 72 hours. Anemia Panel: No results for input(s): VITAMINB12, FOLATE, FERRITIN, TIBC, IRON, RETICCTPCT in the last 72 hours. Sepsis Labs: No results for input(s): PROCALCITON, LATICACIDVEN in the last 168 hours.  Recent Results (from the past 240 hours)  Resp panel by RT-PCR (RSV, Flu A&B, Covid) Anterior Nasal Swab     Status: Abnormal   Collection Time: 08/25/24 10:05 AM   Specimen: Anterior Nasal Swab  Result Value Ref Range Status   SARS Coronavirus 2 by RT PCR NEGATIVE NEGATIVE Final    Comment: (NOTE) SARS-CoV-2 target nucleic acids are NOT DETECTED.  The SARS-CoV-2 RNA is generally detectable in upper respiratory specimens during the acute phase of infection. The lowest concentration of SARS-CoV-2 viral copies this assay can detect is 138 copies/mL. A negative result  does not preclude SARS-Cov-2 infection and should not be used as the sole basis for treatment or other patient management decisions. A negative result may occur with  improper specimen collection/handling, submission of specimen other than nasopharyngeal swab, presence of viral mutation(s) within the areas targeted by this assay, and inadequate number of viral copies(<138 copies/mL). A negative result must be combined with clinical observations, patient history, and epidemiological information. The expected result is Negative.  Fact Sheet for Patients:  bloggercourse.com  Fact Sheet for Healthcare Providers:  seriousbroker.it  This test is no t yet approved or cleared by the [place]  FDA and  has been authorized for detection and/or diagnosis of SARS-CoV-2 by FDA under an Emergency Use Authorization (EUA). This EUA will remain  in effect (meaning this test can be used) for the duration of the COVID-19 declaration under Section 564(b)(1) of the Act, 21 U.S.C.section 360bbb-3(b)(1), unless the authorization is terminated  or revoked sooner.       Influenza A by PCR POSITIVE (A) NEGATIVE Final   Influenza B by PCR NEGATIVE NEGATIVE  Final    Comment: (NOTE) The Xpert Xpress SARS-CoV-2/FLU/RSV plus assay is intended as an aid in the diagnosis of influenza from Nasopharyngeal swab specimens and should not be used as a sole basis for treatment. Nasal washings and aspirates are unacceptable for Xpert Xpress SARS-CoV-2/FLU/RSV testing.  Fact Sheet for Patients: bloggercourse.com  Fact Sheet for Healthcare Providers: seriousbroker.it  This test is not yet approved or cleared by the [place]  FDA and has been authorized for detection and/or diagnosis of SARS-CoV-2 by FDA under an Emergency Use Authorization (EUA). This EUA will remain in effect (meaning this test can be used)  for the duration of the COVID-19 declaration under Section 564(b)(1) of the Act, 21 U.S.C. section 360bbb-3(b)(1), unless the authorization is terminated or revoked.     Resp Syncytial Virus by PCR NEGATIVE NEGATIVE Final    Comment: (NOTE) Fact Sheet for Patients: bloggercourse.com  Fact Sheet for Healthcare Providers: seriousbroker.it  This test is not yet approved or cleared by the [place]  FDA and has been authorized for detection and/or diagnosis of SARS-CoV-2 by FDA under an Emergency Use Authorization (EUA). This EUA will remain in effect (meaning this test can be used) for the duration of the COVID-19 declaration under Section 564(b)(1) of the Act, 21 U.S.C. section 360bbb-3(b)(1), unless the authorization is terminated or revoked.  Performed at [facility], [address], [place], [place] [address]          Radiology Studies: DG Chest Portable 1 View Result Date: 08/25/2024 EXAM: 1 VIEW(S) XRAY OF THE CHEST 08/25/2024 10:17:00 AM COMPARISON: Chest x-ray 03/24/2024, PET CTA 22 25. CLINICAL HISTORY: cough, sob FINDINGS: LUNGS AND PLEURA: Poorly visualized known pleural based lesion in right hemithorax laterally. No pleural effusion. No pneumothorax. HEART AND MEDIASTINUM: No acute abnormality of the cardiac and mediastinal silhouettes. Atherosclerotic plaque. BONES AND SOFT TISSUES: Chronic left lateral eighth rib fracture. Known poorly visualized Lytic lesion in right posterior seventh rib. IMPRESSION: 1. No acute cardiopulmonary abnormality. 2. Known right pleural-based lesion and right posterior seventh rib lytic lesion are poorly visualized on this radiograph. Electronically signed by: Morgane Naveau MD 08/25/2024 12:27 PM EST RP Workstation: [id number]        Scheduled Meds:  acyclovir   400 mg Oral BID   aspirin  EC  81 mg Oral Daily   diltiazem   30 mg Oral Q6H   enoxaparin  (LOVENOX )  injection  80 mg Subcutaneous Q12H   famotidine   20 mg Oral QHS   insulin  aspart  0-15 Units Subcutaneous TID WC   lenalidomide   25 mg Oral Daily   levalbuterol   1.25 mg Nebulization Q8H   predniSONE   40 mg Oral Q breakfast   tamsulosin   0.4 mg Oral Daily   Continuous Infusions:   LOS: 0 days    Time spent: 35 Minutes     Fusako Tanabe A Gleason Ardoin, MD [facility]   If 7PM-7AM, please contact night-coverage [email or web address]  08/26/2024, 7:43 AM   "

## 2024-08-26 NOTE — Consult Note (Addendum)
 "  Cardiology Consultation  Patient ID: [name] MRN: [id number]; DOB: 1951/02/20  Admit date: 08/25/2024 Date of Consult: 08/26/2024  PCP:  Joe Cole, MD   [facility] Providers Cardiologist:  None     Patient Profile: Joe [name] is a 73 y.o. 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 Celinda.  History of Present Illness: [name] has past medical history as stated below.  He presented to the [facility] emergency department on 08/25/2024 for 3 days of flulike symptoms including increased shortness of breath, productive cough, poor oral intake and diarrhea x 1 day.  Initial vital signs were temperature 98.2 F, pulse 92, respiration 28, BP 147/99 and O2 sat 94% on room air.  While in the ER, patient received 500 cc IV fluid bolus, DuoNeb, albuterol  neb, but then was noted to be in A-fib with rates in the 110s.  He then received 15 mg of IV diltiazem , with improvement in his rate. He was then started on PO diltiazem  at 30 mg, however this was discontinued after just one dose do to bradycardia, pauses.  He is admitted to the medicine service, cardiology was asked to consult in the setting of new onset A-fib.  Relevant lab workup while in the ED/since admitted includes: proBNP elevated at 1221, troponin negative x 1, TSH normal, metabolic panel unremarkable, CBC shows leukopenia, at his baseline.  Respiratory panel positive for influenza A.  CXR shows no acute cardiopulmonary abnormality, noted right pleural-based lesion and right posterior seventh rib lytic lesion that are already known.  EKG from 08/25/2024 shows sinus rhythm, HR 51, there are no other updated EKGs in  the chart.  As an outpatient, he sees palliative care in the setting of his ongoing multiple myeloma treatment.  In his last note with them from December 2025, they confirmed that they would like to continue to treat all the treatable things and allow for an opportunity for him to thrive well managing symptoms and minimizing discomfort.  He confirmed that his daughter Joe [name] and niece Joe [name] are his medical decision makers when he is unable to speak for himself.  He is presently on oxycodone  5 mg every 8 hours as needed, typically taking 1 to 2 pills daily.  He had been dealing with bilateral hand tremors, they are currently holding his Revlimid  to see if his symptoms.  During this, this will likely result in reducing the dose.  After speaking with the patient, he felt his heart start racing after bouts of coughing with the flu.  Unfortunately his daughter also has the flu currently at home.  It went through the household.  He denies any chest discomfort.  He usually has his heart rate in the 50s he states and sometimes after going up stairs it may go up to 100.  He has been treated for 13 years for diabetes however most recently his A1c was 5.7.  Has been on Ozempic, Jardiance .  Excellent control.  No prior cardiac history, no MIs.  No prior evidence of cardiomyopathy.  Echocardiogram currently pending.  Past Medical History:  Diagnosis Date   Anxiety    Diabetes mellitus without complication (HCC)    Multiple myeloma (HCC) 04/08/2024   History  reviewed. No pertinent surgical history.   Home Medications:  Prior to Admission medications  Medication Sig Start Date End Date Taking? Authorizing Provider  lenalidomide  (REVLIMID ) 25 MG capsule Take 1 capsule (25 mg total) by mouth daily. For 21 days and off for 7 days for a 28 day cycle.  [name] #[id number]     Date Obtained 08/06/2024 08/06/24  Yes Lanny Callander, MD  acyclovir  (ZOVIRAX ) 400 MG tablet Take 1 tablet (400 mg total) by mouth 2 (two)  times daily. 05/20/24   Lanny Callander, MD  aspirin  EC 81 MG tablet Take 81 mg by mouth daily. Swallow whole.    [provider]  calcium  citrate (CALCITRATE - DOSED IN MG ELEMENTAL CALCIUM ) 950 (200 Ca) MG tablet Take 600 mg of elemental calcium  by mouth 3 (three) times daily.    [provider]  cholecalciferol (VITAMIN D3) 25 MCG (1000 UNIT) tablet Take 3,000 Units by mouth daily.    [provider]  dexamethasone  (DECADRON ) 4 MG tablet Take 5 tablets (20 mg total) by mouth once a week. Take on the week on Wednesday without Dara injection 06/11/24   Feng, Yan, MD  diazepam  (VALIUM ) 2 MG tablet Take 2 mg by mouth in the morning and at bedtime. 12/13/15   [provider]  esomeprazole (NEXIUM) 40 MG capsule Take 40 mg by mouth daily before breakfast. 11/26/15   [provider]  famotidine  (PEPCID ) 20 MG tablet Take 20 mg by mouth at bedtime. 11/02/23   [provider]  FLONASE ALLERGY RELIEF 50 MCG/ACT nasal spray Place 1-2 sprays into both nostrils in the morning and at bedtime.    [provider]  JARDIANCE  10 MG TABS tablet Take 10 mg by mouth daily.    [provider]  Multiple Vitamin (MULTIVITAMIN) tablet Take 1 tablet by mouth daily with breakfast.    [provider]  ondansetron  (ZOFRAN ) 8 MG tablet Take 1 tablet (8 mg total) by mouth every 8 (eight) hours as needed for nausea or vomiting. 04/23/24   Lanny Callander, MD  [facility] VERIO test strip  12/26/15   [provider]  oxyCODONE  (OXY IR/ROXICODONE ) 5 MG immediate release tablet Take 1 tablet (5 mg total) by mouth every 8 (eight) hours as needed for severe pain (pain score 7-10). 08/06/24   Pickenpack-Cousar, Fannie SAILOR, NP  rosuvastatin  (CRESTOR ) 10 MG tablet Take 10 mg by mouth daily. 02/01/16   [provider]  sildenafil (VIAGRA) 100 MG tablet Take 100 mg by mouth daily as needed (for E.D.).    [provider]  tamsulosin  (FLOMAX ) 0.4 MG CAPS  capsule 1 capsule. 06/10/24   [provider]  Turmeric (QC TUMERIC COMPLEX) 500 MG CAPS Take 4,800 capsules by mouth 2 (two) times daily.    [provider]    Scheduled Meds:  acyclovir   400 mg Oral BID   aspirin  EC  81 mg Oral Daily   diltiazem   30 mg Oral Q6H   enoxaparin  (LOVENOX ) injection  80 mg Subcutaneous Q12H   famotidine   20 mg Oral QHS   insulin  aspart  0-15 Units Subcutaneous TID WC   levalbuterol   1.25 mg Nebulization Q6H   oseltamivir   75 mg Oral BID   predniSONE   40 mg Oral Q breakfast   tamsulosin   0.4 mg Oral Daily   Continuous Infusions:  PRN Meds: acetaminophen  **OR** acetaminophen , guaiFENesin , ondansetron  **OR** ondansetron  (ZOFRAN ) IV  Allergies:   Allergies[1]  Social History:   Social History  Socioeconomic History   Marital status: Widowed    Spouse name: Not on file   Number of children: Not on file   Years of education: Not on file   Highest education level: Not on file  Occupational History   Not on file  Tobacco Use   Smoking status: Never   Smokeless tobacco: Not on file  Substance and Sexual Activity   Alcohol use: No    Alcohol/week: 0.0 standard drinks of alcohol   Drug use: No   Sexual activity: Not on file  Other Topics Concern   Not on file  Social History Narrative   Not on file   Social Drivers of Health   Tobacco Use: Unknown (08/25/2024)   Patient History    Smoking Tobacco Use: Never    Smokeless Tobacco Use: Unknown    Passive Exposure: Not on file  Financial Resource Strain: Not on file  Food Insecurity: No Food Insecurity (08/25/2024)   Epic    Worried About Programme Researcher, Broadcasting/film/video in the Last Year: Never true    Ran Out of Food in the Last Year: Never true  Transportation Needs: No Transportation Needs (08/25/2024)   Epic    Lack of Transportation (Medical): No    Lack of Transportation (Non-Medical): No  Physical Activity: Not on file  Stress: Not on file  Social Connections: Socially  Isolated (08/25/2024)   Social Connection and Isolation Panel    Frequency of Communication with Friends and Family: More than three times a week    Frequency of Social Gatherings with Friends and Family: More than three times a week    Attends Religious Services: Never    Database Administrator or Organizations: No    Attends Banker Meetings: Never    Marital Status: Widowed  Intimate Partner Violence: Not At Risk (08/25/2024)   Epic    Fear of Current or Ex-Partner: No    Emotionally Abused: No    Physically Abused: No    Sexually Abused: No  Depression (PHQ2-9): Low Risk (08/18/2024)   Depression (PHQ2-9)    PHQ-2 Score: 0  Alcohol Screen: Not on file  Housing: Low Risk (08/25/2024)   Epic    Unable to Pay for Housing in the Last Year: No    Number of Times Moved in the Last Year: 0    Homeless in the Last Year: No  Utilities: Not At Risk (08/25/2024)   Epic    Threatened with loss of utilities: No  Health Literacy: Not on file    Family History:   History reviewed. No pertinent family history.   ROS:  Please see the history of present illness.  All other ROS reviewed and negative.     Physical Exam/Data: Vitals:   08/26/24 0506 08/26/24 0805 08/26/24 0844 08/26/24 0951  BP: 131/68   (!) 139/92  Pulse: (!) 56   (!) 147  Resp: 20 (!) 27 (!) 26 (!) 21  Temp: 98.3 F (36.8 C)   97.6 F (36.4 C)  TempSrc: Oral   Oral  SpO2: 94%   92%  Weight:      Height:        Intake/Output Summary (Last 24 hours) at 08/26/2024 1001 Last data filed at 08/25/2024 1745 Gross per 24 hour  Intake 502.08 ml  Output --  Net 502.08 ml      08/25/2024    5:15 PM 08/20/2024    8:31 AM 08/18/2024    2:52 PM  Last 3 Weights  Weight (lbs) 172 lb 182 lb 9.6 oz 182 lb 14.4 oz  Weight (kg) 78.019 kg 82.827 kg 82.963 kg     Body mass index is 26.94 kg/m.   General:  Well nourished, well developed, in no acute distress HEENT: normal Neck: no JVD Vascular: No  carotid bruits; Distal pulses 2+ bilaterally Cardiac:  normal S1, S2; Irreg irreg tachy; no murmur  Lungs:  clear to auscultation bilaterally, mild wheezing, no rhonchi or rales  Abd: soft, nontender, no hepatomegaly  Ext: no edema Musculoskeletal:  No deformities, BUE and BLE strength normal and equal Skin: warm and dry  Neuro:  CNs 2-12 intact, no focal abnormalities noted Psych:  Normal affect   EKG:  The EKG was personally reviewed and demonstrates:  sinus rhythm, HR 51 on 08/25/2024 at 10:31 AM.  He has however on 08/26/2024 at 9:48 AM going back into atrial flutters/coarse atrial fibrillation.  Telemetry:  Telemetry was personally reviewed and demonstrates: Shows transient mild postconversion pause of less than 2 seconds with transient bradycardia following.  Currently heart rate between 110 and 145.  Relevant CV Studies:  Echocardiogram, 08/26/2024 Ordered, pending results   Laboratory Data: High Sensitivity Troponin:  No results for input(s): TROPONINIHS in the last 720 hours.  Recent Labs  Lab 08/25/24 1046  TRNPT <15      Chemistry Recent Labs  Lab 08/25/24 1005 08/26/24 0523  NA 141 144  K 3.5 4.4  CL 106 108  CO2 21* 21*  GLUCOSE 112* 150*  BUN 17 23  CREATININE 0.56* 0.59*  CALCIUM  8.9 8.7*  MG  --  3.0*  GFRNONAA >60 >60  ANIONGAP 15 15    Recent Labs  Lab 08/25/24 1005 08/26/24 0523  PROT 6.6 6.2*  ALBUMIN 3.8 3.6  AST 17 17  ALT 17 15  ALKPHOS 87 79  BILITOT 1.0 0.7   Lipids No results for input(s): CHOL, TRIG, HDL, LABVLDL, LDLCALC, CHOLHDL in the last 168 hours.  Hematology Recent Labs  Lab 08/25/24 1005 08/26/24 0523  WBC 5.9 2.8*  RBC 4.65 4.33  HGB 15.1 14.0  HCT 45.5 42.4  MCV 97.8 97.9  MCH 32.5 32.3  MCHC 33.2 33.0  RDW 14.7 14.6  PLT 182 177   Thyroid  No results for input(s): TSH, FREET4 in the last 168 hours.  BNP Recent Labs  Lab 08/25/24 1126  PROBNP 1,221.0*    DDimer No results for input(s):  DDIMER in the last 168 hours.  Radiology/Studies:  DG Chest Portable 1 View Result Date: 08/25/2024 EXAM: 1 VIEW(S) XRAY OF THE CHEST 08/25/2024 10:17:00 AM COMPARISON: Chest x-ray 03/24/2024, PET CTA 22 25. CLINICAL HISTORY: cough, sob FINDINGS: LUNGS AND PLEURA: Poorly visualized known pleural based lesion in right hemithorax laterally. No pleural effusion. No pneumothorax. HEART AND MEDIASTINUM: No acute abnormality of the cardiac and mediastinal silhouettes. Atherosclerotic plaque. BONES AND SOFT TISSUES: Chronic left lateral eighth rib fracture. Known poorly visualized Lytic lesion in right posterior seventh rib. IMPRESSION: 1. No acute cardiopulmonary abnormality. 2. Known right pleural-based lesion and right posterior seventh rib lytic lesion are poorly visualized on this radiograph. Electronically signed by: Morgane Naveau MD 08/25/2024 12:27 PM EST RP Workstation: [id number]   Assessment and Plan:  Paroxysmal atrial fibrillation, new onset No known history of prior A-Fib/flutter Presented to ER with flu-like symptoms x 3 days Positive for influenza A which likely triggered his fibrillation. Noted to be in A-Fib with rates in 110s, as high as 140s here  this morning. Given IV diltiazem  15 mg x 1 dose, then started on PO diltiazem  30 mg p.o. every 6 hours Rates transiently dropped to 30-50s with minimal postconversion pauses TSH normal, K normal, Mag pending   Pending echocardiogram  Currently on Lovenox  80 mg BID for anticoagulation  Currently on diltiazem  30 mg p.o. every 6 hours.  Lets continue this for now.  There was no significant evidence of prolonged bradycardia.  As his influenza is being treated, hopefully his atrial fibrillation/flutter will improve/resolve and he will resort back to normal sinus rhythm.  We will try to avoid amiodarone if possible given his underlying cirrhosis although if felt to be beneficial from a palliation standpoint it may not be unreasonable. He has  not had any evidence of severe anemia or severe thrombocytopenia.  I think for the time being it would be reasonable to give him Eliquis  5 mg twice a day for stroke prophylaxis.  Watch for any signs of bleeding. Will stop the full dose Lovenox .  Per primary Influenza A infection Acute bronchitis  Metastatic multiple myeloma Cancer-related pain Type 2 diabetes Peripheral neuropathy  Liver cirrhosis, NASH GERD Hypertension  Risk Assessment/Risk Scores:      CHA2DS2-VASc Score = 4   This indicates a 4.8% annual risk of stroke. The patient's score is based upon: CHF History: 0 HTN History: 1 Diabetes History: 1 Stroke History: 0 Vascular Disease History: 0 Age Score: 2 Gender Score: 0     For questions or updates, please contact [facility] Please consult [email or web address] for contact info under   Signed, Oneil Parchment, MD  08/26/2024 10:01 AM     [1] No Known Allergies  "

## 2024-08-26 NOTE — Plan of Care (Incomplete)
" °  Problem: Education: Goal: Knowledge of General Education information will improve Description: Including pain rating scale, medication(s)/side effects and non-pharmacologic comfort measures Outcome: Progressing   Problem: Health Behavior/Discharge Planning: Goal: Ability to manage health-related needs will improve Outcome: Progressing   Problem: Clinical Measurements: Goal: Ability to maintain clinical measurements within normal limits will improve Outcome: Progressing Goal: Will remain free from infection Outcome: Progressing Goal: Diagnostic test results will improve Outcome: Progressing Goal: Respiratory complications will improve Outcome: Progressing Goal: Cardiovascular complication will be avoided Outcome: Progressing   Problem: Nutrition: Goal: Adequate nutrition will be maintained Outcome: Progressing   Problem: Coping: Goal: Level of anxiety will decrease Outcome: Progressing   Problem: Activity: Goal: Risk for activity intolerance will decrease Outcome: Adequate for Discharge   Problem: Elimination: Goal: Will not experience complications related to bowel motility Outcome: Adequate for Discharge Goal: Will not experience complications related to urinary retention Outcome: Adequate for Discharge   "

## 2024-08-26 NOTE — Progress Notes (Signed)
 Pt had 2 sec pause with HR 37, non sustain. Also had 2nd degree AVB with HR 37, non sustain. NP made aware. Pt remain asymptomatic.

## 2024-08-27 ENCOUNTER — Encounter: Payer: Self-pay | Admitting: Nurse Practitioner

## 2024-08-27 DIAGNOSIS — Z85828 Personal history of other malignant neoplasm of skin: Secondary | ICD-10-CM | POA: Diagnosis not present

## 2024-08-27 DIAGNOSIS — E1142 Type 2 diabetes mellitus with diabetic polyneuropathy: Secondary | ICD-10-CM | POA: Diagnosis not present

## 2024-08-27 DIAGNOSIS — E1151 Type 2 diabetes mellitus with diabetic peripheral angiopathy without gangrene: Secondary | ICD-10-CM | POA: Diagnosis not present

## 2024-08-27 DIAGNOSIS — C9000 Multiple myeloma not having achieved remission: Secondary | ICD-10-CM | POA: Diagnosis not present

## 2024-08-27 DIAGNOSIS — G893 Neoplasm related pain (acute) (chronic): Secondary | ICD-10-CM | POA: Diagnosis not present

## 2024-08-27 DIAGNOSIS — K219 Gastro-esophageal reflux disease without esophagitis: Secondary | ICD-10-CM | POA: Diagnosis not present

## 2024-08-27 DIAGNOSIS — K7581 Nonalcoholic steatohepatitis (NASH): Secondary | ICD-10-CM | POA: Diagnosis not present

## 2024-08-27 DIAGNOSIS — K7469 Other cirrhosis of liver: Secondary | ICD-10-CM | POA: Diagnosis not present

## 2024-08-27 DIAGNOSIS — I10 Essential (primary) hypertension: Secondary | ICD-10-CM | POA: Diagnosis not present

## 2024-08-27 DIAGNOSIS — J45909 Unspecified asthma, uncomplicated: Secondary | ICD-10-CM | POA: Diagnosis not present

## 2024-08-27 DIAGNOSIS — I4891 Unspecified atrial fibrillation: Secondary | ICD-10-CM | POA: Diagnosis not present

## 2024-08-27 DIAGNOSIS — J101 Influenza due to other identified influenza virus with other respiratory manifestations: Secondary | ICD-10-CM | POA: Diagnosis not present

## 2024-08-27 DIAGNOSIS — C7951 Secondary malignant neoplasm of bone: Secondary | ICD-10-CM | POA: Diagnosis not present

## 2024-08-27 DIAGNOSIS — Z8579 Personal history of other malignant neoplasms of lymphoid, hematopoietic and related tissues: Secondary | ICD-10-CM | POA: Diagnosis not present

## 2024-08-27 DIAGNOSIS — K7460 Unspecified cirrhosis of liver: Secondary | ICD-10-CM | POA: Diagnosis not present

## 2024-08-27 LAB — CBC WITH DIFFERENTIAL/PLATELET
Abs Immature Granulocytes: 0.02 K/uL (ref 0.00–0.07)
Basophils Absolute: 0 K/uL (ref 0.0–0.1)
Basophils Relative: 0 %
Eosinophils Absolute: 0 K/uL (ref 0.0–0.5)
Eosinophils Relative: 0 %
HCT: 39 % (ref 39.0–52.0)
Hemoglobin: 13.2 g/dL (ref 13.0–17.0)
Immature Granulocytes: 1 %
Lymphocytes Relative: 6 %
Lymphs Abs: 0.2 K/uL — ABNORMAL LOW (ref 0.7–4.0)
MCH: 32.6 pg (ref 26.0–34.0)
MCHC: 33.8 g/dL (ref 30.0–36.0)
MCV: 96.3 fL (ref 80.0–100.0)
Monocytes Absolute: 0.2 K/uL (ref 0.1–1.0)
Monocytes Relative: 5 %
Neutro Abs: 3.4 K/uL (ref 1.7–7.7)
Neutrophils Relative %: 88 %
Platelets: 153 K/uL (ref 150–400)
RBC: 4.05 MIL/uL — ABNORMAL LOW (ref 4.22–5.81)
RDW: 14.6 % (ref 11.5–15.5)
WBC: 3.9 K/uL — ABNORMAL LOW (ref 4.0–10.5)
nRBC: 0 % (ref 0.0–0.2)

## 2024-08-27 LAB — BASIC METABOLIC PANEL WITH GFR
Anion gap: 9 (ref 5–15)
BUN: 32 mg/dL — ABNORMAL HIGH (ref 8–23)
CO2: 21 mmol/L — ABNORMAL LOW (ref 22–32)
Calcium: 8 mg/dL — ABNORMAL LOW (ref 8.9–10.3)
Chloride: 112 mmol/L — ABNORMAL HIGH (ref 98–111)
Creatinine, Ser: 0.51 mg/dL — ABNORMAL LOW (ref 0.61–1.24)
GFR, Estimated: >60 mL/min
Glucose, Bld: 113 mg/dL — ABNORMAL HIGH (ref 70–99)
Potassium: 3.9 mmol/L (ref 3.5–5.1)
Sodium: 142 mmol/L (ref 135–145)

## 2024-08-27 LAB — GLUCOSE, CAPILLARY
Glucose-Capillary: 123 mg/dL — ABNORMAL HIGH (ref 70–99)
Glucose-Capillary: 125 mg/dL — ABNORMAL HIGH (ref 70–99)
Glucose-Capillary: 161 mg/dL — ABNORMAL HIGH (ref 70–99)
Glucose-Capillary: 192 mg/dL — ABNORMAL HIGH (ref 70–99)

## 2024-08-27 MED ORDER — DILTIAZEM HCL ER COATED BEADS 120 MG PO CP24
120.0000 mg | ORAL_CAPSULE | Freq: Every day | ORAL | Status: DC
  Administered 2024-08-27 – 2024-08-28 (×2): 120 mg via ORAL
  Filled 2024-08-27 (×2): qty 1

## 2024-08-27 NOTE — Progress Notes (Signed)
"  °  Progress Note  Patient Name: Joe [name] Date of Encounter: 08/27/2024 [facility] Cardiologist: Oneil Parchment, MD   Interval Summary   Approximately 4-second pause, postconversion pause.  Asymptomatic.  Vital Signs Vitals:   08/26/24 2156 08/27/24 0048 08/27/24 0436 08/27/24 0913  BP: 139/76 138/80 118/75 (!) 151/62  Pulse: (!) 106 72 73 76  Resp: 19  19 (!) 22  Temp: 98 F (36.7 C)  97.6 F (36.4 C) 98 F (36.7 C)  TempSrc: Oral  Oral Oral  SpO2: 91%  95% 94%  Weight:      Height:        Intake/Output Summary (Last 24 hours) at 08/27/2024 1023 Last data filed at 08/26/2024 1300 Gross per 24 hour  Intake 480 ml  Output --  Net 480 ml      08/25/2024    5:15 PM 08/20/2024    8:31 AM 08/18/2024    2:52 PM  Last 3 Weights  Weight (lbs) 172 lb 182 lb 9.6 oz 182 lb 14.4 oz  Weight (kg) 78.019 kg 82.827 kg 82.963 kg      Telemetry/ECG  Atrial fibrillation, 3 to 4-second pause noted- Personally Reviewed  Physical Exam  GEN: No acute distress.   Neck: No JVD Cardiac: Irregularly irregular, no murmurs, rubs, or gallops.  Respiratory: Clear to auscultation bilaterally. GI: Soft, nontender, non-distended  MS: No edema  Assessment & Plan   Atrial fibrillation with rapid ventricular response-now sinus rhythm-normal echocardiogram. - Previously treated with IV diltiazem  infusion but this was stopped because of multiple pauses seen. -Oral diltiazem  was used yesterday but again had pauses ranging from 2 seconds up to 4 seconds.  Interestingly, these are not all postconversion pauses. -Echo normal. -Continue with Eliquis  -We will consolidate his diltiazem  CD to 120 mg once a day.  Low-dose.  As he stays out of atrial fibrillation, we should not see postconversion pauses obviously.  No indication for pacemaker at this time. -As his flu improves, atrial fibrillation is improving as well. -In the next several months, would consider ZIO monitor to see if there  is any further evidence of atrial fibrillation and if not, we could consider this a reversible cause and potentially discontinue his anticoagulation then.  We shall see. -I will stop aspirin  81 mg since he is on Eliquis . -We will get an EKG to document sinus rhythm.  Flu A - Tamiflu .  We will go ahead and sign off.  Okay for discharge from cardiac perspective.  Please let us  know if we can be of further assistance.     For questions or updates, please contact [facility] Please consult [email or web address] for contact info under         Signed, Oneil Parchment, MD   "

## 2024-08-27 NOTE — Hospital Course (Signed)
 73 year old male PMH including diabetes mellitus type 2, cirrhosis, multiple myeloma, presenting with cough, runny nose, wheezing, shortness of breath.  Also found to have new diagnosis atrial fibrillation with RVR.  Admitted for acute influenza A, atrial fibrillation with rapid ventricular response.  Seen by cardiology, initially treated with diltiazem  infusion but this was stopped secondary to pauses.  Consultants Cardiology   Procedures/Events 12/29 admit for flu, afib/RVR

## 2024-08-27 NOTE — Plan of Care (Signed)

## 2024-08-27 NOTE — Progress Notes (Signed)
" °  Progress Note   Patient: Joe [name] FMW:[id number] DOB: 01-Jan-1951 DOA: 08/25/2024     0 DOS: the patient was seen and examined on 08/27/2024   Brief hospital course: 73 year old male PMH including diabetes mellitus type 2, cirrhosis, multiple myeloma, presenting with cough, runny nose, wheezing, shortness of breath.  Also found to have new diagnosis atrial fibrillation with RVR.  Admitted for acute influenza A, atrial fibrillation with rapid ventricular response.  Seen by cardiology, initially treated with diltiazem  infusion but this was stopped secondary to pauses.  Consultants Cardiology   Procedures/Events 12/29 admit for flu, afib/RVR  Assessment and Plan: Acute influenza A Acute bronchitis reactive airway disease Presented with mild symptoms.  Started on Tamiflu .  Treating with prednisone  for acute bronchitis. Appears to be resolving rapidly.  Patient relatively asymptomatic.  Atrial flutter/fibrillation with rapid ventricular response Treated with diltiazem  infusion but this was stopped secondary to multiple pauses.  Seen by cardiology and started on low-dose oral Cardizem .  Echocardiogram normal LVEF, valves unremarkable. Several pauses this a.m., 2.15, 2.45, 4.17 seconds. Continue apixaban  per cardiology.  Further management as per cardiology.  Essential hypertension Stable  NASH cirrhosis Stable.  Follow-up as an outpatient  Diabetes mellitus type 2 with peripheral neuropathy Hemoglobin A1c 5.8 CBG stable.  Continue Jardiance .  Can resume Ozempic on discharge.  Multiple myeloma with metastatic disease Stable.  Holding Revlimid  until seen by oncologist Dr. Lanny.     Subjective:  Was a little dizzy earlier, but feels well now.  No real cough.  Breathing fine.  Up to bathroom okay.  Physical Exam: Vitals:   08/26/24 2156 08/27/24 0048 08/27/24 0436 08/27/24 0913  BP: 139/76 138/80 118/75 (!) 151/62  Pulse: (!) 106 72 73 76  Resp: 19  19 (!) 22  Temp: 98 F  (36.7 C)  97.6 F (36.4 C) 98 F (36.7 C)  TempSrc: Oral  Oral Oral  SpO2: 91%  95% 94%  Weight:      Height:       Physical Exam Vitals reviewed.  Constitutional:      General: He is not in acute distress.    Appearance: He is not ill-appearing or toxic-appearing.  Cardiovascular:     Rate and Rhythm: Normal rate and regular rhythm.     Heart sounds: No murmur heard.    Comments: Telemetry reviewed, multiple pauses 2.15, 2.45, 4.17 seconds Pulmonary:     Effort: Pulmonary effort is normal. No respiratory distress.     Breath sounds: No wheezing, rhonchi or rales.  Musculoskeletal:     Right lower leg: No edema.     Left lower leg: No edema.  Neurological:     Mental Status: He is alert.  Psychiatric:        Mood and Affect: Mood normal.        Behavior: Behavior normal.     Data Reviewed: CBG stable BMP unremarkable BNP was 1221 on 12/29, troponin was negative same day WBC 3.9, remainder CBC unremarkable Hemoglobin A1c 5.8 Influenza PCR was positive Echo LVEF 60 to 65%, normal function, no wall motion abnormalities, normal right ventricular systolic function, valves unremarkable  Family Communication: none present or requested  Disposition: Status is: Observation      Time spent: 35 minutes  Author: Toribio Door, MD 08/27/2024 10:21 AM  For on call review [email or web address].    "

## 2024-08-28 DIAGNOSIS — Z79899 Other long term (current) drug therapy: Secondary | ICD-10-CM | POA: Diagnosis not present

## 2024-08-28 DIAGNOSIS — Z85828 Personal history of other malignant neoplasm of skin: Secondary | ICD-10-CM | POA: Diagnosis not present

## 2024-08-28 DIAGNOSIS — G893 Neoplasm related pain (acute) (chronic): Secondary | ICD-10-CM | POA: Diagnosis not present

## 2024-08-28 DIAGNOSIS — K219 Gastro-esophageal reflux disease without esophagitis: Secondary | ICD-10-CM | POA: Diagnosis not present

## 2024-08-28 DIAGNOSIS — J101 Influenza due to other identified influenza virus with other respiratory manifestations: Secondary | ICD-10-CM | POA: Diagnosis not present

## 2024-08-28 DIAGNOSIS — K7469 Other cirrhosis of liver: Secondary | ICD-10-CM | POA: Diagnosis not present

## 2024-08-28 DIAGNOSIS — J45909 Unspecified asthma, uncomplicated: Secondary | ICD-10-CM | POA: Diagnosis not present

## 2024-08-28 DIAGNOSIS — Z7982 Long term (current) use of aspirin: Secondary | ICD-10-CM | POA: Diagnosis not present

## 2024-08-28 DIAGNOSIS — E1151 Type 2 diabetes mellitus with diabetic peripheral angiopathy without gangrene: Secondary | ICD-10-CM | POA: Diagnosis not present

## 2024-08-28 DIAGNOSIS — C9000 Multiple myeloma not having achieved remission: Secondary | ICD-10-CM | POA: Diagnosis not present

## 2024-08-28 DIAGNOSIS — I10 Essential (primary) hypertension: Secondary | ICD-10-CM | POA: Diagnosis not present

## 2024-08-28 DIAGNOSIS — K7581 Nonalcoholic steatohepatitis (NASH): Secondary | ICD-10-CM | POA: Diagnosis not present

## 2024-08-28 DIAGNOSIS — Z85830 Personal history of malignant neoplasm of bone: Secondary | ICD-10-CM | POA: Diagnosis not present

## 2024-08-28 DIAGNOSIS — Z8579 Personal history of other malignant neoplasms of lymphoid, hematopoietic and related tissues: Secondary | ICD-10-CM | POA: Diagnosis not present

## 2024-08-28 DIAGNOSIS — C7951 Secondary malignant neoplasm of bone: Secondary | ICD-10-CM | POA: Diagnosis not present

## 2024-08-28 DIAGNOSIS — I4891 Unspecified atrial fibrillation: Secondary | ICD-10-CM | POA: Diagnosis not present

## 2024-08-28 DIAGNOSIS — R0602 Shortness of breath: Secondary | ICD-10-CM | POA: Diagnosis present

## 2024-08-28 DIAGNOSIS — E1142 Type 2 diabetes mellitus with diabetic polyneuropathy: Secondary | ICD-10-CM | POA: Diagnosis not present

## 2024-08-28 DIAGNOSIS — Z7984 Long term (current) use of oral hypoglycemic drugs: Secondary | ICD-10-CM | POA: Diagnosis not present

## 2024-08-28 DIAGNOSIS — K7460 Unspecified cirrhosis of liver: Secondary | ICD-10-CM | POA: Diagnosis not present

## 2024-08-28 LAB — GLUCOSE, CAPILLARY
Glucose-Capillary: 116 mg/dL — ABNORMAL HIGH (ref 70–99)
Glucose-Capillary: 133 mg/dL — ABNORMAL HIGH (ref 70–99)

## 2024-08-28 MED ORDER — DILTIAZEM HCL ER COATED BEADS 120 MG PO CP24: 120.0000 mg | ORAL_CAPSULE | Freq: Every day | ORAL | 2 refills | Status: DC

## 2024-08-28 MED ORDER — APIXABAN 5 MG PO TABS: 5.0000 mg | ORAL_TABLET | Freq: Two times a day (BID) | ORAL | 2 refills | Status: DC

## 2024-08-28 MED ORDER — OSELTAMIVIR PHOSPHATE 75 MG PO CAPS: 75.0000 mg | ORAL_CAPSULE | Freq: Two times a day (BID) | ORAL | 0 refills | Status: DC

## 2024-08-28 NOTE — Discharge Summary (Signed)
 " Physician Discharge Summary   Patient: Joe [name] MRN: [id number] DOB: 1951-05-11  Admit date:     08/25/2024  Discharge date: 08/28/2024  Discharge Physician: Joe [name]   PCP: Joe Cole, MD   Recommendations at discharge:   Atrial flutter/fibrillation with rapid ventricular response Cardiology consolidated diltiazem  120 mg daily, In the next several months, would consider ZIO monitor to see if there is any further evidence of atrial fibrillation and if not, we could consider this a reversible cause and potentially discontinue his anticoagulation then. We shall see. Stopped aspirin  given on apixaban   Discharge Diagnoses: Principal Problem:   Influenza A Active Problems:   Hypertension, essential   Liver cirrhosis secondary to NASH (HCC)   Type 2 diabetes mellitus with peripheral neuropathy (HCC)   Multiple myeloma (HCC)   Gastro-esophageal reflux disease without esophagitis   Metastasis to bone (HCC)   Reactive airway disease   Atrial fibrillation with RVR (HCC)  Resolved Problems:   * No resolved hospital problems. *  Hospital Course: 74 year old male PMH including diabetes mellitus type 2, cirrhosis, multiple myeloma, presenting with cough, runny nose, wheezing, shortness of breath.  Also found to have new diagnosis atrial fibrillation with RVR.  Admitted for acute influenza A, atrial fibrillation with rapid ventricular response.  Seen by cardiology, initially treated with diltiazem  infusion but this was stopped secondary to pauses.  Was successfully transitioned to oral medication and discharged home.  Consultants Cardiology   Procedures/Events 12/29 admit for flu, afib/RVR  Acute influenza A Acute bronchitis reactive airway disease Presented with mild symptoms.  Started on Tamiflu .  Treated with prednisone  for acute bronchitis. Appears to be resolved.   Atrial flutter/fibrillation with rapid ventricular response Treated with diltiazem  infusion but this was  stopped secondary to multiple pauses.  Seen by cardiology and started on low-dose oral Cardizem .  Echocardiogram normal LVEF, valves unremarkable. Several pauses 2.15, 2.45, 4.17 seconds. Continue apixaban  per cardiology.   Cardiology consolidated diltiazem  120 mg daily, In the next several months, would consider ZIO monitor to see if there is any further evidence of atrial fibrillation and if not, we could consider this a reversible cause and potentially discontinue his anticoagulation then. We shall see. Stopped aspirin  given on apixaban    Essential hypertension Stable   NASH cirrhosis Stable.  Follow-up as an outpatient   Diabetes mellitus type 2 with peripheral neuropathy Hemoglobin A1c 5.8 CBG stable.  Continue Jardiance .  Can resume Ozempic on discharge.   Multiple myeloma with metastatic disease Stable.  Holding Revlimid  until seen by oncologist Dr. Lanny.  Disposition: Home Diet recommendation:  Regular diet DISCHARGE MEDICATION: Allergies as of 08/28/2024       Reactions   Iodinated Contrast Media Diarrhea, Nausea And Vomiting, Other (See Comments)   Symptoms lasted for 5 hours after receiving this dye        Medication List     PAUSE taking these medications    lenalidomide  25 MG capsule Wait to take this until your doctor or other care provider tells you to start again. Commonly known as: REVLIMID  Take 1 capsule (25 mg total) by mouth daily. For 21 days and off for 7 days for a 28 day cycle.  Joe [name] #[id number]     Date Obtained 08/06/2024       STOP taking these medications    aspirin  EC 81 MG tablet   diazepam  2 MG tablet Commonly known as: VALIUM        TAKE these medications  acyclovir  400 MG tablet Commonly known as: ZOVIRAX  Take 1 tablet (400 mg total) by mouth 2 (two) times daily.   apixaban  5 MG Tabs tablet Commonly known as: ELIQUIS  Take 1 tablet (5 mg total) by mouth 2 (two) times daily.   CALCIUM  + D3 PO Take 1 tablet by  mouth See admin instructions. Calcium  600 mg + Vitamin D -3 2,000 units - Take 1 tablet by mouth two to four times a day   dexamethasone  4 MG tablet Commonly known as: DECADRON  Take 5 tablets (20 mg total) by mouth once a week. Take on the week on Wednesday without Dara injection   diltiazem  120 MG 24 hr capsule Commonly known as: CARDIZEM  CD Take 1 capsule (120 mg total) by mouth daily. Start taking on: August 29, 2024   esomeprazole 40 MG capsule Commonly known as: NEXIUM Take 40 mg by mouth daily before breakfast.   famotidine  20 MG tablet Commonly known as: PEPCID  Take 20 mg by mouth at bedtime.   Flonase Allergy Relief 50 MCG/ACT nasal spray Generic drug: fluticasone Place 1-2 sprays into both nostrils 2 (two) times daily as needed for allergies or rhinitis.   Jardiance  10 MG Tabs tablet Generic drug: empagliflozin  Take 10 mg by mouth daily.   multivitamin tablet Take 1 tablet by mouth daily with breakfast.   ondansetron  8 MG tablet Commonly known as: Zofran  Take 1 tablet (8 mg total) by mouth every 8 (eight) hours as needed for nausea or vomiting.   OneTouch Verio test strip Generic drug: glucose blood   oseltamivir  75 MG capsule Commonly known as: TAMIFLU  Take 1 capsule (75 mg total) by mouth 2 (two) times daily.   oxyCODONE  5 MG immediate release tablet Commonly known as: Oxy IR/ROXICODONE  Take 1 tablet (5 mg total) by mouth every 8 (eight) hours as needed for severe pain (pain score 7-10).   rosuvastatin  10 MG tablet Commonly known as: CRESTOR  Take 10 mg by mouth daily.   TURMERIC PO Take 4,800 mg by mouth in the morning and at bedtime.        Follow-up Information     [facility] of [facility] Follow up.   Specialty: Cardiology Why: Office will contact you with an appointment Contact information: [address] [place]  [id number] [phone]               Discharge  Exam: Joe [name]   08/25/24 1715  Weight: 78 kg   Physical Exam Vitals reviewed.  Constitutional:      General: He is not in acute distress.    Appearance: He is not ill-appearing or toxic-appearing.  Cardiovascular:     Rate and Rhythm: Normal rate and regular rhythm.     Heart sounds: No murmur heard. Pulmonary:     Effort: Pulmonary effort is normal. No respiratory distress.     Breath sounds: No wheezing, rhonchi or rales.  Neurological:     Mental Status: He is alert.  Psychiatric:        Mood and Affect: Mood normal.        Behavior: Behavior normal.      Condition at discharge: good  The results of significant diagnostics from this hospitalization (including imaging, microbiology, ancillary and laboratory) are listed below for reference.   Imaging Studies: ECHOCARDIOGRAM COMPLETE Result Date: 08/26/2024    ECHOCARDIOGRAM REPORT   Patient Name:   Romero ETAI COPADO Date of Exam: 08/26/2024  Medical Rec #:  [id number]     Height:       67.0 in Accession #:    [id number]    Weight:       172.0 lb Date of Birth:  04-Mar-1951     BSA:          1.897 m Patient Age:    73 years      BP:           131/68 mmHg Patient Gender: M             HR:           110 bpm. Exam Location:  Inpatient Procedure: 2D Echo, Cardiac Doppler and Color Doppler (Both Spectral and Color            Flow Doppler were utilized during procedure). Indications:    AFIB I48.91  History:        Patient has no prior history of Echocardiogram examinations.                 Risk Factors:Hypertension.  Sonographer:    Nathanel Devonshire Referring Phys: [id number] Clent MANUEL ORTIZ IMPRESSIONS  1. Left ventricular ejection fraction, by estimation, is 60 to 65%. The left ventricle has normal function. The left ventricle has no regional wall motion abnormalities. Left ventricular diastolic function could not be evaluated.  2. Right ventricular systolic function is normal. The right ventricular size is normal.  3. The mitral valve is  normal in structure. No evidence of mitral valve regurgitation. No evidence of mitral stenosis.  4. The aortic valve is tricuspid. There is mild thickening of the aortic valve. Aortic valve regurgitation is not visualized. Mild aortic valve stenosis. Aortic valve Vmax measures 2.03 m/s.  5. The inferior vena cava is normal in size with greater than 50% respiratory variability, suggesting right atrial pressure of 3 mmHg. FINDINGS  Left Ventricle: Left ventricular ejection fraction, by estimation, is 60 to 65%. The left ventricle has normal function. The left ventricle has no regional wall motion abnormalities. The left ventricular internal cavity size was normal in size. There is  no left ventricular hypertrophy. Left ventricular diastolic function could not be evaluated due to atrial fibrillation. Left ventricular diastolic function could not be evaluated. Right Ventricle: The right ventricular size is normal. No increase in right ventricular wall thickness. Right ventricular systolic function is normal. Left Atrium: Left atrial size was normal in size. Right Atrium: Right atrial size was normal in size. Pericardium: There is no evidence of pericardial effusion. Mitral Valve: The mitral valve is normal in structure. No evidence of mitral valve regurgitation. No evidence of mitral valve stenosis. Tricuspid Valve: The tricuspid valve is normal in structure. Tricuspid valve regurgitation is not demonstrated. No evidence of tricuspid stenosis. Aortic Valve: The aortic valve is tricuspid. There is mild thickening of the aortic valve. Aortic valve regurgitation is not visualized. Mild aortic stenosis is present. Aortic valve mean gradient measures 6.0 mmHg. Aortic valve peak gradient measures 16.5 mmHg. Aortic valve area, by VTI measures 2.47 cm. Pulmonic Valve: The pulmonic valve was normal in structure. Pulmonic valve regurgitation is not visualized. No evidence of pulmonic stenosis. Aorta: The aortic root is normal in  size and structure. Venous: The inferior vena cava is normal in size with greater than 50% respiratory variability, suggesting right atrial pressure of 3 mmHg. IAS/Shunts: No atrial level shunt detected by color flow Doppler.  LEFT VENTRICLE PLAX 2D LVIDd:  4.70 cm     Diastology LVIDs:         3.10 cm     LV e' medial:    7.07 cm/s LV PW:         1.10 cm     LV E/e' medial:  8.8 LV IVS:        1.00 cm     LV e' lateral:   8.92 cm/s LVOT diam:     2.10 cm     LV E/e' lateral: 7.0 LV SV:         89 LV SV Index:   47 LVOT Area:     3.46 cm LV IVRT:       134 msec  LV Volumes (MOD) LV vol d, MOD A2C: 54.6 ml LV vol d, MOD A4C: 82.8 ml LV vol s, MOD A2C: 23.4 ml LV vol s, MOD A4C: 28.9 ml LV SV MOD A2C:     31.2 ml LV SV MOD A4C:     82.8 ml LV SV MOD BP:      43.3 ml RIGHT VENTRICLE          IVC RV Basal diam:  3.70 cm  IVC diam: 1.90 cm TAPSE (M-mode): 2.4 cm LEFT ATRIUM             Index        RIGHT ATRIUM           Index LA diam:        3.50 cm 1.85 cm/m   RA Area:     15.40 cm LA Vol (A2C):   96.1 ml 50.67 ml/m  RA Volume:   45.10 ml  23.78 ml/m LA Vol (A4C):   34.7 ml 18.29 ml/m LA Biplane Vol: 64.0 ml 33.74 ml/m  AORTIC VALVE                     PULMONIC VALVE AV Area (Vmax):    2.39 cm      PV Vmax:       0.99 m/s AV Area (Vmean):   2.52 cm      PV Peak grad:  3.9 mmHg AV Area (VTI):     2.47 cm AV Vmax:           203.00 cm/s AV Vmean:          111.000 cm/s AV VTI:            0.362 m AV Peak Grad:      16.5 mmHg AV Mean Grad:      6.0 mmHg LVOT Vmax:         140.00 cm/s LVOT Vmean:        80.800 cm/s LVOT VTI:          0.258 m LVOT/AV VTI ratio: 0.71  AORTA Ao Root diam: 3.40 cm Ao Asc diam:  3.20 cm MITRAL VALVE MV Area (PHT): 2.32 cm    SHUNTS MV E velocity: 62.10 cm/s  Systemic VTI:  0.26 m MV A velocity: 77.60 cm/s  Systemic Diam: 2.10 cm MV E/A ratio:  0.80 Morene Brownie Electronically signed by Morene Brownie Signature Date/Time: 08/26/2024/11:45:25 AM    Final    DG Chest Portable  1 View Result Date: 08/25/2024 EXAM: 1 VIEW(S) XRAY OF THE CHEST 08/25/2024 10:17:00 AM COMPARISON: Chest x-ray 03/24/2024, PET CTA 22 25. CLINICAL HISTORY: cough, sob FINDINGS: LUNGS AND PLEURA: Poorly visualized known pleural based lesion in right hemithorax laterally. No pleural effusion.  No pneumothorax. HEART AND MEDIASTINUM: No acute abnormality of the cardiac and mediastinal silhouettes. Atherosclerotic plaque. BONES AND SOFT TISSUES: Chronic left lateral eighth rib fracture. Known poorly visualized Lytic lesion in right posterior seventh rib. IMPRESSION: 1. No acute cardiopulmonary abnormality. 2. Known right pleural-based lesion and right posterior seventh rib lytic lesion are poorly visualized on this radiograph. Electronically signed by: Morgane Naveau MD 08/25/2024 12:27 PM EST RP Workstation: [id number]   MR LUMBAR SPINE W WO CONTRAST Result Date: 08/20/2024 CLINICAL DATA:  Multiple myeloma, back pain EXAM: MRI LUMBAR SPINE WITHOUT AND WITH CONTRAST TECHNIQUE: Multiplanar and multiecho pulse sequences of the lumbar spine were obtained without and with intravenous contrast. CONTRAST:  8mL GADAVIST  GADOBUTROL  1 MMOL/ML IV SOLN COMPARISON:  None Available. FINDINGS: Bone marrow: There is a mild compression fracture of the superior endplate of L4 with bone marrow edema in the superior endplate. There are multiple small bone lesions present throughout the lumbar spine, sacrum and iliac bones. The largest of these is within the T11 vertebral body. Conus and cauda equina: No significant abnormality Paraspinal tissues: No significant abnormality L1-L2: Normal L2-L3: Normal L3-L4: There is a mild disc bulge. There is mild facet arthropathy with facet enlargement and mild spinal stenosis. No significant foraminal stenosis L4-L5: The disc is normal.  No significant facet disease L5-S1: There is mild degenerative disc disease with a mild disc bulge. No significant facet disease IMPRESSION: 1. Multiple small  bone lesions consistent with the history of myeloma throughout the lumbar spine, sacrum and iliac bones 2. Mild compression fracture of the superior endplate of L4 with mild edema in the superior endplate Electronically Signed   By: Nancyann Burns M.D.   On: 08/20/2024 11:14    Microbiology: Results for orders placed or performed during the hospital encounter of 08/25/24  Resp panel by RT-PCR (RSV, Flu A&B, Covid) Anterior Nasal Swab     Status: Abnormal   Collection Time: 08/25/24 10:05 AM   Specimen: Anterior Nasal Swab  Result Value Ref Range Status   SARS Coronavirus 2 by RT PCR NEGATIVE NEGATIVE Final    Comment: (NOTE) SARS-CoV-2 target nucleic acids are NOT DETECTED.  The SARS-CoV-2 RNA is generally detectable in upper respiratory specimens during the acute phase of infection. The lowest concentration of SARS-CoV-2 viral copies this assay can detect is 138 copies/mL. A negative result does not preclude SARS-Cov-2 infection and should not be used as the sole basis for treatment or other patient management decisions. A negative result may occur with  improper specimen collection/handling, submission of specimen other than nasopharyngeal swab, presence of viral mutation(s) within the areas targeted by this assay, and inadequate number of viral copies(<138 copies/mL). A negative result must be combined with clinical observations, patient history, and epidemiological information. The expected result is Negative.  Fact Sheet for Patients:  bloggercourse.com  Fact Sheet for Healthcare Providers:  seriousbroker.it  This test is no t yet approved or cleared by the [place]  FDA and  has been authorized for detection and/or diagnosis of SARS-CoV-2 by FDA under an Emergency Use Authorization (EUA). This EUA will remain  in effect (meaning this test can be used) for the duration of the COVID-19 declaration under Section 564(b)(1) of  the Act, 21 U.S.C.section 360bbb-3(b)(1), unless the authorization is terminated  or revoked sooner.       Influenza A by PCR POSITIVE (A) NEGATIVE Final   Influenza B by PCR NEGATIVE NEGATIVE Final    Comment: (NOTE) The Xpert Xpress SARS-CoV-2/FLU/RSV plus  assay is intended as an aid in the diagnosis of influenza from Nasopharyngeal swab specimens and should not be used as a sole basis for treatment. Nasal washings and aspirates are unacceptable for Xpert Xpress SARS-CoV-2/FLU/RSV testing.  Fact Sheet for Patients: bloggercourse.com  Fact Sheet for Healthcare Providers: seriousbroker.it  This test is not yet approved or cleared by the [place]  FDA and has been authorized for detection and/or diagnosis of SARS-CoV-2 by FDA under an Emergency Use Authorization (EUA). This EUA will remain in effect (meaning this test can be used) for the duration of the COVID-19 declaration under Section 564(b)(1) of the Act, 21 U.S.C. section 360bbb-3(b)(1), unless the authorization is terminated or revoked.     Resp Syncytial Virus by PCR NEGATIVE NEGATIVE Final    Comment: (NOTE) Fact Sheet for Patients: bloggercourse.com  Fact Sheet for Healthcare Providers: seriousbroker.it  This test is not yet approved or cleared by the [place]  FDA and has been authorized for detection and/or diagnosis of SARS-CoV-2 by FDA under an Emergency Use Authorization (EUA). This EUA will remain in effect (meaning this test can be used) for the duration of the COVID-19 declaration under Section 564(b)(1) of the Act, 21 U.S.C. section 360bbb-3(b)(1), unless the authorization is terminated or revoked.  Performed at [facility], [address] [place], [place] [address]     Labs: CBC: Recent Labs  Lab 08/25/24 1005 08/26/24 0523 08/27/24 0555  WBC 5.9 2.8* 3.9*   NEUTROABS 4.4  --  3.4  HGB 15.1 14.0 13.2  HCT 45.5 42.4 39.0  MCV 97.8 97.9 96.3  PLT 182 177 153   Basic Metabolic Panel: Recent Labs  Lab 08/25/24 1005 08/26/24 0523 08/27/24 0555  NA 141 144 142  K 3.5 4.4 3.9  CL 106 108 112*  CO2 21* 21* 21*  GLUCOSE 112* 150* 113*  BUN 17 23 32*  CREATININE 0.56* 0.59* 0.51*  CALCIUM  8.9 8.7* 8.0*  MG  --  3.0*  --    Liver Function Tests: Recent Labs  Lab 08/25/24 1005 08/26/24 0523  AST 17 17  ALT 17 15  ALKPHOS 87 79  BILITOT 1.0 0.7  PROT 6.6 6.2*  ALBUMIN 3.8 3.6   CBG: Recent Labs  Lab 08/27/24 1208 08/27/24 1559 08/27/24 2122 08/28/24 0802 08/28/24 1148  GLUCAP 125* 161* 192* 116* 133*    Discharge time spent: less than 30 minutes.  Signed: Toribio Door, MD [facility] 08/28/2024 "

## 2024-08-28 NOTE — Progress Notes (Signed)
 Discharge instructions reviewed with patient, verbalized understanding. All questions answered. All belongings accounted for. Patient to follow up with MD in  1-2 weeks. PIV removed. Assisted via WC to private vehicle.

## 2024-09-01 ENCOUNTER — Telehealth: Payer: Self-pay

## 2024-09-01 NOTE — Telephone Encounter (Signed)
 Pt's niece called stating that the pt was recently d/c from hospital for flu and was told to hold the oral chemo until further directed by Dr Lanny and her Team.  Pt's niece stated the pt is scheduled to start his IV chemo on 09/03/2024 and would like to see or speak w/a provider prior to that date so they will have an understanding as to when he's to restart his oral chemo.  Pt's niece stated she had spoken with Powell who instructed her to contact our office once the pt has been d/c from the hospital.  Stated this nurse will make Powell Lessen, NP aware and will f/u with the pt & niece regarding what to do with pt's oral chemo.

## 2024-09-02 ENCOUNTER — Other Ambulatory Visit: Payer: Self-pay

## 2024-09-02 MED ORDER — LENALIDOMIDE 25 MG PO CAPS: 25.0000 mg | ORAL_CAPSULE | Freq: Every day | ORAL | 0 refills | Status: AC

## 2024-09-03 ENCOUNTER — Inpatient Hospital Stay

## 2024-09-06 ENCOUNTER — Emergency Department (HOSPITAL_COMMUNITY)

## 2024-09-06 ENCOUNTER — Encounter (HOSPITAL_COMMUNITY): Payer: Self-pay

## 2024-09-06 ENCOUNTER — Emergency Department (HOSPITAL_COMMUNITY)
Admission: EM | Admit: 2024-09-06 | Discharge: 2024-09-07 | Disposition: A | Attending: Emergency Medicine | Admitting: Emergency Medicine

## 2024-09-06 ENCOUNTER — Other Ambulatory Visit: Payer: Self-pay

## 2024-09-06 DIAGNOSIS — R197 Diarrhea, unspecified: Secondary | ICD-10-CM | POA: Insufficient documentation

## 2024-09-06 DIAGNOSIS — I4891 Unspecified atrial fibrillation: Secondary | ICD-10-CM | POA: Insufficient documentation

## 2024-09-06 DIAGNOSIS — R112 Nausea with vomiting, unspecified: Secondary | ICD-10-CM | POA: Insufficient documentation

## 2024-09-06 LAB — COMPREHENSIVE METABOLIC PANEL WITH GFR
ALT: 20 U/L (ref 0–44)
AST: 23 U/L (ref 15–41)
Albumin: 3.6 g/dL (ref 3.5–5.0)
Alkaline Phosphatase: 89 U/L (ref 38–126)
Anion gap: 10 (ref 5–15)
BUN: 19 mg/dL (ref 8–23)
CO2: 23 mmol/L (ref 22–32)
Calcium: 9 mg/dL (ref 8.9–10.3)
Chloride: 109 mmol/L (ref 98–111)
Creatinine, Ser: 0.63 mg/dL (ref 0.61–1.24)
GFR, Estimated: >60 mL/min
Glucose, Bld: 170 mg/dL — ABNORMAL HIGH (ref 70–99)
Potassium: 4.1 mmol/L (ref 3.5–5.1)
Sodium: 142 mmol/L (ref 135–145)
Total Bilirubin: 0.5 mg/dL (ref 0.0–1.2)
Total Protein: 5.6 g/dL — ABNORMAL LOW (ref 6.5–8.1)

## 2024-09-06 LAB — CBC WITH DIFFERENTIAL/PLATELET
Abs Immature Granulocytes: 0.03 K/uL (ref 0.00–0.07)
Basophils Absolute: 0 K/uL (ref 0.0–0.1)
Basophils Relative: 0 %
Eosinophils Absolute: 0.1 K/uL (ref 0.0–0.5)
Eosinophils Relative: 2 %
HCT: 41.9 % (ref 39.0–52.0)
Hemoglobin: 14.1 g/dL (ref 13.0–17.0)
Immature Granulocytes: 0 %
Lymphocytes Relative: 9 %
Lymphs Abs: 0.8 K/uL (ref 0.7–4.0)
MCH: 32.7 pg (ref 26.0–34.0)
MCHC: 33.7 g/dL (ref 30.0–36.0)
MCV: 97.2 fL (ref 80.0–100.0)
Monocytes Absolute: 0.2 K/uL (ref 0.1–1.0)
Monocytes Relative: 3 %
Neutro Abs: 7.8 K/uL — ABNORMAL HIGH (ref 1.7–7.7)
Neutrophils Relative %: 86 %
Platelets: 277 K/uL (ref 150–400)
RBC: 4.31 MIL/uL (ref 4.22–5.81)
RDW: 14.6 % (ref 11.5–15.5)
WBC: 9 K/uL (ref 4.0–10.5)
nRBC: 0 % (ref 0.0–0.2)

## 2024-09-06 LAB — I-STAT CG4 LACTIC ACID, ED: Lactic Acid, Venous: 2.3 mmol/L (ref 0.5–1.9)

## 2024-09-06 LAB — PROTIME-INR
INR: 1.2 (ref 0.8–1.2)
Prothrombin Time: 15.9 s — ABNORMAL HIGH (ref 11.4–15.2)

## 2024-09-06 MED ORDER — LACTATED RINGERS IV BOLUS
1000.0000 mL | Freq: Once | INTRAVENOUS | Status: AC
  Administered 2024-09-06: 1000 mL via INTRAVENOUS

## 2024-09-06 NOTE — ED Provider Notes (Signed)
 " [facility] Department Provider Note MRN:  [id number]  Arrival date & time: 09/07/2024     Chief Complaint   Irregular Heart Beat   History of Present Illness   Joe [name] is a 74 y.o. year-old male presents to the ED with chief complaint of rapid HR.  States that he took his HR tonight and it was 150-155, so he came to the ER for evaluation.  States that he had been having n/v/d earlier today.  States that the whole family had the flu over Christmas.  Now many family members have gastro symptoms.  States that he took cardizem  just prior to arrival.    History provided by patient.   Review of Systems  Pertinent positive and negative review of systems noted in HPI.    Physical Exam   Vitals:   09/06/24 2146 09/07/24 0112  BP: (!) 154/94 (!) 131/101  Pulse: (!) 105 (!) 104  Resp: (!) 23 12  Temp: 98.7 F (37.1 C) 97.8 F (36.6 C)  SpO2: 97% 94%    CONSTITUTIONAL:  non toxic-appearing, NAD NEURO:  Alert and oriented x 3, CN 3-12 grossly intact EYES:  eyes equal and reactive ENT/NECK:  Supple, no stridor  CARDIO:  mildly tachycardic, regular rhythm, appears well-perfused  PULM:  No respiratory distress, CTAB GI/GU:  non-distended,  MSK/SPINE:  No gross deformities, no edema, moves all extremities  SKIN:  no rash, atraumatic   *Additional and/or pertinent findings included in MDM below  Diagnostic and Interventional Summary    EKG Interpretation Date/Time:  Saturday September 06 2024 21:45:07 EST Ventricular Rate:  107 PR Interval:    QRS Duration:  100 QT Interval:  345 QTC Calculation: 441 R Axis:   -48  Text Interpretation: Atrial flutter LAD, consider left anterior fascicular block Confirmed by Franklyn Gills [phone]) on 09/06/2024 9:55:48 PM       Labs Reviewed  COMPREHENSIVE METABOLIC PANEL WITH GFR - Abnormal; Notable for the following components:      Result Value   Glucose, Bld 170 (*)    Total Protein 5.6 (*)     All other components within normal limits  CBC WITH DIFFERENTIAL/PLATELET - Abnormal; Notable for the following components:   Neutro Abs 7.8 (*)    All other components within normal limits  PROTIME-INR - Abnormal; Notable for the following components:   Prothrombin Time 15.9 (*)    All other components within normal limits  URINALYSIS, W/ REFLEX TO CULTURE (INFECTION SUSPECTED) - Abnormal; Notable for the following components:   Glucose, UA >=500 (*)    Ketones, ur 20 (*)    All other components within normal limits  I-STAT CG4 LACTIC ACID, ED - Abnormal; Notable for the following components:   Lactic Acid, Venous 2.3 (*)    All other components within normal limits  CULTURE, BLOOD (ROUTINE X 2)  CULTURE, BLOOD (ROUTINE X 2)    DG Chest Port 1 View  Final Result      Medications  lactated ringers  bolus 1,000 mL (0 mLs Intravenous Stopped 09/07/24 0017)     Procedures  /  Critical Care Procedures  ED Course and Medical Decision Making  I have reviewed the triage vital signs, the nursing notes, and pertinent available records from the EMR.  Social Determinants Affecting Complexity of Care: Patient has no clinically significant social determinants affecting this chief complaint..   ED Course:    Medical Decision Making Patient here with rapid heart rate.  States that he was recently diagnosed with A-fib.  States that he has been controlling his A-fib with Cardizem .  Also reports that he has been taking Eliquis .  He has not missed any doses.  He states that he was initially diagnosed with this after he had influenza around Christmas.  States that the past day or so he has been having nausea, vomiting, diarrhea, along with the rest of his family.  He notes that this evening his heart rate was in the 150s.  He checked this on a finger monitor.  Patient took Cardizem  prior to arriving in the ED.  He states that about 15 minutes into the ambulance ride, his heart rate slowed down.   His heart rates have been in the low 100s since then.  He still complains of some mild nausea, which he attributes to his GI symptoms, which are also present among his family members.  He is treated with IV fluid.  Labs and workup are fairly reassuring.  He remains fairly well rate controlled with heart rates in the low 100s.  I do not think that he requires admission at this time.  He has cardiology follow-up in 5 days.  He is compliant with Eliquis  and Cardizem .  He and his wife are comfortable with discharge plan.  Return precautions discussed.  Amount and/or Complexity of Data Reviewed Labs: ordered. Radiology: ordered.         Consultants: No consultations were needed in caring for this patient.   Treatment and Plan: I considered admission due to patient's initial presentation, but after considering the examination and diagnostic results, patient will not require admission and can be discharged with outpatient follow-up.  Patient discussed with attending physician, Dr. Trine, who agrees with plan.  Final Clinical Impressions(s) / ED Diagnoses     ICD-10-CM   1. Atrial fibrillation with RVR (HCC)  I48.91     2. Nausea vomiting and diarrhea  R11.2    R19.7       ED Discharge Orders     None         Discharge Instructions Discussed with and Provided to Patient:     Discharge Instructions      Please continue taking all of your medications as prescribed.  If your heart rate comes back up into the 150s, you should return to the ER.  I recommend hydration with Gatorade and water mixture.  Please follow-up with your cardiologist as planned on Friday.  If anything else should change or worsen, please return or be seen by your doctor.       Vicky Charleston, PA-C 09/07/24 0302    Trine Raynell Moder, MD 09/07/24 [phone]  "

## 2024-09-06 NOTE — ED Triage Notes (Signed)
 Pt to ED by EMS from home with c/o a-fib rvr. Pt is a cancer pt and was recently treated for the flu. He checked his spo2 at home today and was noted to have a HR in the 150's. Pt states this happened after an episode of NVD. Arrives A+O, VSS, NADN.

## 2024-09-07 LAB — BLOOD CULTURE ID PANEL (REFLEXED) - BCID2

## 2024-09-07 LAB — URINALYSIS, W/ REFLEX TO CULTURE (INFECTION SUSPECTED)
Bacteria, UA: NONE SEEN
Bilirubin Urine: NEGATIVE
Glucose, UA: >=500 mg/dL — AB
Hgb urine dipstick: NEGATIVE
Ketones, ur: 20 mg/dL — AB
Leukocytes,Ua: NEGATIVE
Nitrite: NEGATIVE
Protein, ur: NEGATIVE mg/dL
Specific Gravity, Urine: 1.03 (ref 1.005–1.030)
pH: 7 (ref 5.0–8.0)

## 2024-09-07 NOTE — Discharge Instructions (Signed)
 Please continue taking all of your medications as prescribed.  If your heart rate comes back up into the 150s, you should return to the ER.  I recommend hydration with Gatorade and water mixture.  Please follow-up with your cardiologist as planned on Friday.  If anything else should change or worsen, please return or be seen by your doctor.

## 2024-09-08 ENCOUNTER — Inpatient Hospital Stay: Attending: Hematology | Admitting: Nurse Practitioner

## 2024-09-08 DIAGNOSIS — Z5112 Encounter for antineoplastic immunotherapy: Secondary | ICD-10-CM | POA: Insufficient documentation

## 2024-09-08 DIAGNOSIS — C9000 Multiple myeloma not having achieved remission: Secondary | ICD-10-CM | POA: Insufficient documentation

## 2024-09-08 DIAGNOSIS — Z79620 Long term (current) use of immunosuppressive biologic: Secondary | ICD-10-CM | POA: Insufficient documentation

## 2024-09-08 LAB — CULTURE, BLOOD (ROUTINE X 2)

## 2024-09-09 ENCOUNTER — Telehealth (HOSPITAL_BASED_OUTPATIENT_CLINIC_OR_DEPARTMENT_OTHER): Payer: Self-pay

## 2024-09-09 ENCOUNTER — Encounter: Payer: Self-pay | Admitting: Nurse Practitioner

## 2024-09-09 NOTE — Progress Notes (Signed)
 ED Antimicrobial Stewardship Positive Culture Follow Up   Joe [name] is an 74 y.o. male who presented to [facility] on 09/06/2024 with a chief complaint of  Chief Complaint  Patient presents with   Irregular Heart Beat    Recent Results (from the past 720 hours)  Resp panel by RT-PCR (RSV, Flu A&B, Covid) Anterior Nasal Swab     Status: Abnormal   Collection Time: 08/25/24 10:05 AM   Specimen: Anterior Nasal Swab  Result Value Ref Range Status   SARS Coronavirus 2 by RT PCR NEGATIVE NEGATIVE Final    Comment: (NOTE) SARS-CoV-2 target nucleic acids are NOT DETECTED.  The SARS-CoV-2 RNA is generally detectable in upper respiratory specimens during the acute phase of infection. The lowest concentration of SARS-CoV-2 viral copies this assay can detect is 138 copies/mL. A negative result does not preclude SARS-Cov-2 infection and should not be used as the sole basis for treatment or other patient management decisions. A negative result may occur with  improper specimen collection/handling, submission of specimen other than nasopharyngeal swab, presence of viral mutation(s) within the areas targeted by this assay, and inadequate number of viral copies(<138 copies/mL). A negative result must be combined with clinical observations, patient history, and epidemiological information. The expected result is Negative.  Fact Sheet for Patients:  bloggercourse.com  Fact Sheet for Healthcare Providers:  seriousbroker.it  This test is no t yet approved or cleared by the [place]  FDA and  has been authorized for detection and/or diagnosis of SARS-CoV-2 by FDA under an Emergency Use Authorization (EUA). This EUA will remain  in effect (meaning this test can be used) for the duration of the COVID-19 declaration under Section 564(b)(1) of the Act, 21 U.S.C.section 360bbb-3(b)(1), unless the authorization is terminated  or revoked sooner.        Influenza A by PCR POSITIVE (A) NEGATIVE Final   Influenza B by PCR NEGATIVE NEGATIVE Final    Comment: (NOTE) The Xpert Xpress SARS-CoV-2/FLU/RSV plus assay is intended as an aid in the diagnosis of influenza from Nasopharyngeal swab specimens and should not be used as a sole basis for treatment. Nasal washings and aspirates are unacceptable for Xpert Xpress SARS-CoV-2/FLU/RSV testing.  Fact Sheet for Patients: bloggercourse.com  Fact Sheet for Healthcare Providers: seriousbroker.it  This test is not yet approved or cleared by the [place]  FDA and has been authorized for detection and/or diagnosis of SARS-CoV-2 by FDA under an Emergency Use Authorization (EUA). This EUA will remain in effect (meaning this test can be used) for the duration of the COVID-19 declaration under Section 564(b)(1) of the Act, 21 U.S.C. section 360bbb-3(b)(1), unless the authorization is terminated or revoked.     Resp Syncytial Virus by PCR NEGATIVE NEGATIVE Final    Comment: (NOTE) Fact Sheet for Patients: bloggercourse.com  Fact Sheet for Healthcare Providers: seriousbroker.it  This test is not yet approved or cleared by the [place]  FDA and has been authorized for detection and/or diagnosis of SARS-CoV-2 by FDA under an Emergency Use Authorization (EUA). This EUA will remain in effect (meaning this test can be used) for the duration of the COVID-19 declaration under Section 564(b)(1) of the Act, 21 U.S.C. section 360bbb-3(b)(1), unless the authorization is terminated or revoked.  Performed at [facility], [address] [place], [place] [address]   Blood Culture (routine x 2)     Status: Abnormal   Collection Time: 09/06/24 10:49 PM   Specimen: BLOOD LEFT FOREARM  Result Value Ref  Range Status   Specimen Description   Final    BLOOD LEFT  FOREARM Performed at [facility], [address] [place], [place] [address]    Special Requests   Final    BOTTLES DRAWN AEROBIC AND ANAEROBIC Blood Culture results may not be optimal due to an inadequate volume of blood received in culture bottles Performed at [facility], [address] [place], [place] [address]    Culture  Setup Time   Final    GRAM POSITIVE COCCI IN CLUSTERS AEROBIC BOTTLE ONLY CRITICAL RESULT CALLED TO, READ BACK BY AND VERIFIED WITH: CHARGE NURSE H LACIVITA 09/07/2024 @ 2310 BY AB    Culture (A)  Final    STAPHYLOCOCCUS EPIDERMIDIS THE SIGNIFICANCE OF ISOLATING THIS ORGANISM FROM A SINGLE SET OF BLOOD CULTURES WHEN MULTIPLE SETS ARE DRAWN IS UNCERTAIN. PLEASE NOTIFY THE MICROBIOLOGY DEPARTMENT WITHIN ONE WEEK IF SPECIATION AND SENSITIVITIES ARE REQUIRED. Performed at [facility], [address] [place], [place] [address]    Report Status 09/08/2024 FINAL  Final  Blood Culture ID Panel (Reflexed)     Status: Abnormal   Collection Time: 09/06/24 10:49 PM  Result Value Ref Range Status   Enterococcus faecalis NOT DETECTED NOT DETECTED Final   Enterococcus Faecium NOT DETECTED NOT DETECTED Final   Listeria monocytogenes NOT DETECTED NOT DETECTED Final   Staphylococcus species DETECTED (A) NOT DETECTED Final    Comment: CRITICAL RESULT CALLED TO, READ BACK BY AND VERIFIED WITH: CHARGE NURSE H LACIVITA 09/07/2024 @ 2310 BY AB    Staphylococcus aureus (BCID) NOT DETECTED NOT DETECTED Final   Staphylococcus epidermidis DETECTED (A) NOT DETECTED Final    Comment: Methicillin (oxacillin) resistant coagulase negative staphylococcus. Possible blood culture contaminant (unless isolated from more than one blood culture draw or clinical case suggests pathogenicity). No antibiotic treatment is indicated for blood  culture contaminants. CRITICAL RESULT CALLED TO, READ BACK BY AND VERIFIED WITH: CHARGE NURSE H LACIVITA 09/07/2024  @ 2310 BY AB    Staphylococcus lugdunensis NOT DETECTED NOT DETECTED Final   Streptococcus species NOT DETECTED NOT DETECTED Final   Streptococcus agalactiae NOT DETECTED NOT DETECTED Final   Streptococcus pneumoniae NOT DETECTED NOT DETECTED Final   Streptococcus pyogenes NOT DETECTED NOT DETECTED Final   A.calcoaceticus-baumannii NOT DETECTED NOT DETECTED Final   Bacteroides fragilis NOT DETECTED NOT DETECTED Final   Enterobacterales NOT DETECTED NOT DETECTED Final   Enterobacter cloacae complex NOT DETECTED NOT DETECTED Final   Escherichia coli NOT DETECTED NOT DETECTED Final   Klebsiella aerogenes NOT DETECTED NOT DETECTED Final   Klebsiella oxytoca NOT DETECTED NOT DETECTED Final   Klebsiella pneumoniae NOT DETECTED NOT DETECTED Final   Proteus species NOT DETECTED NOT DETECTED Final   Salmonella species NOT DETECTED NOT DETECTED Final   Serratia marcescens NOT DETECTED NOT DETECTED Final   Haemophilus influenzae NOT DETECTED NOT DETECTED Final   Neisseria meningitidis NOT DETECTED NOT DETECTED Final   Pseudomonas aeruginosa NOT DETECTED NOT DETECTED Final   Stenotrophomonas maltophilia NOT DETECTED NOT DETECTED Final   Candida albicans NOT DETECTED NOT DETECTED Final   Candida auris NOT DETECTED NOT DETECTED Final   Candida glabrata NOT DETECTED NOT DETECTED Final   Candida krusei NOT DETECTED NOT DETECTED Final   Candida parapsilosis NOT DETECTED NOT DETECTED Final   Candida tropicalis NOT DETECTED NOT DETECTED Final   Cryptococcus neoformans/gattii NOT DETECTED NOT DETECTED Final   Methicillin resistance mecA/C DETECTED (A) NOT DETECTED Final  Comment: CRITICAL RESULT CALLED TO, READ BACK BY AND VERIFIED WITH: CHARGE NURSE H LACIVITA 09/07/2024 @ 2310 BY AB Performed at [facility], [address] [place], [place] [address]   Blood Culture (routine x 2)     Status: None (Preliminary result)   Collection Time: 09/06/24 10:58 PM   Specimen: BLOOD RIGHT ARM  Result  Value Ref Range Status   Specimen Description   Final    BLOOD RIGHT ARM Performed at [facility], [address] [place], [place] [address]    Special Requests   Final    BOTTLES DRAWN AEROBIC AND ANAEROBIC Blood Culture results may not be optimal due to an inadequate volume of blood received in culture bottles Performed at [facility], [address], [place], [place] [address]    Culture   Final    NO GROWTH 2 DAYS Performed at [facility], [address] [place], [place] [address]    Report Status PENDING  Incomplete    35 YOM recently admitted 12/29-1/1 for influenza and Afib with RVR who presented to the ED on 1/11 with rapid HR, afebrile (98.7), WBC wnl (9).   The patient had 1 of 4 blood cultures that grew MRSE - presumed to be a contaminate, no treatment indicated at this time.   ED Provider: Lonni Camp PA-C  Thank you for allowing pharmacy to be a part of this patients care.  Almarie Lunger, PharmD, BCPS, BCIDP Infectious Diseases Clinical Pharmacist 09/09/2024 8:10 AM   **Pharmacist phone directory can now be found on [email or web address] ([email or web address]).  Listed under [facility].

## 2024-09-09 NOTE — Telephone Encounter (Signed)
 Post ED Visit - Positive Culture Follow-up  Culture report reviewed by antimicrobial stewardship pharmacist: [facility] []  Rankin Dee, Pharm.D. []  Venetia Gully, Pharm.D., BCPS AQ-ID []  Garrel Crews, Pharm.D., BCPS [x]  Almarie Lunger, Pharm.D., BCPS []  [place], [address].D., BCPS, AAHIVP []  Rosaline Bihari, Pharm.D., BCPS, AAHIVP []  Vernell Meier, PharmD, BCPS []  Latanya Hint, PharmD, BCPS []  Donald Medley, PharmD, BCPS []  Rocky Bold, PharmD []  Dorothyann Alert, PharmD, BCPS []  Morene Babe, PharmD  [facility] []  Rosaline Edison, PharmD []  Romona Bliss, PharmD []  Dolphus Roller, PharmD []  Veva Seip, Rph []  Vernell Daunt) Leonce, PharmD []  Eva Allis, PharmD []  Rosaline Millet, PharmD []  Iantha Batch, PharmD []  Arvin Gauss, PharmD []  Wanda Hasting, PharmD []  Ronal Rav, PharmD []  Rocky Slade, PharmD []  Bard Jeans, PharmD   Positive blood culture   73 YOM recently admitted 12/29-1/1 for influenza and Afib with RVR who presented to the ED on 1/11 with rapid HR, afebrile (98.7), WBC wnl (9).    The patient had 1 of 4 blood cultures that grew MRSE - presumed to be a contaminate, no treatment indicated at this time.   No further patient follow-up is required at this time.  Ruth Camelia Elbe 09/09/2024, 1:02 PM

## 2024-09-10 ENCOUNTER — Encounter: Payer: Self-pay | Admitting: Hematology

## 2024-09-10 NOTE — Progress Notes (Signed)
 erroneous

## 2024-09-11 LAB — CULTURE, BLOOD (ROUTINE X 2): Culture: NO GROWTH

## 2024-09-12 ENCOUNTER — Ambulatory Visit (HOSPITAL_COMMUNITY)
Admission: RE | Admit: 2024-09-12 | Discharge: 2024-09-12 | Disposition: A | Discharge status: Home / Self Care | Source: Ambulatory Visit | Attending: Physician Assistant | Admitting: Physician Assistant

## 2024-09-12 VITALS — BP 150/84 | HR 71 | Ht 67.0 in | Wt 172.6 lb

## 2024-09-12 DIAGNOSIS — I484 Atypical atrial flutter: Secondary | ICD-10-CM | POA: Insufficient documentation

## 2024-09-12 DIAGNOSIS — D6869 Other thrombophilia: Secondary | ICD-10-CM | POA: Insufficient documentation

## 2024-09-12 DIAGNOSIS — I4891 Unspecified atrial fibrillation: Secondary | ICD-10-CM | POA: Insufficient documentation

## 2024-09-12 DIAGNOSIS — I480 Paroxysmal atrial fibrillation: Secondary | ICD-10-CM | POA: Insufficient documentation

## 2024-09-12 MED ORDER — DILTIAZEM HCL ER COATED BEADS 120 MG PO CP24: 120.0000 mg | ORAL_CAPSULE | Freq: Every day | ORAL | 1 refills | Status: AC

## 2024-09-12 MED ORDER — APIXABAN 5 MG PO TABS: 5.0000 mg | ORAL_TABLET | Freq: Two times a day (BID) | ORAL | 1 refills | Status: AC

## 2024-09-12 NOTE — Progress Notes (Signed)
 "   Primary Care Physician: Joe Cole, MD Primary Cardiologist: Joe Parchment, MD Electrophysiologist: None  Referring Physician: Dr Joe [name] Joe [name] is a 74 y.o. 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 He presented to the [facility] emergency department on 08/25/2024 for 3 days of flulike symptoms including increased shortness of breath, productive cough, poor oral intake and diarrhea x 1 day. He tested positive for flu A. ECG showed afib and he was started on IV diltiazem  but this was discontinued after just one dose do to bradycardia, pauses. He spontaneously converted to SR. He was started on oral diltiazem  for rate control and Eliquis  for stroke prevention.   He was seen at the ED 09/06/24 with rapid heart rates. ECG showed coarse afib vs atypical atrial flutter. Patient reported nausea, vomiting, and diarrhea along with the rest of his family. His heart rates slowed to ~100 bpm and he was discharged.    Patient presents today for follow up for atrial fibrillation. Patient remains in rate controlled atrial flutter today. He denies any cardiac awareness of his arrhythmia. No bleeding issues on anticoagulation.   Today, he denies symptoms of palpitations, chest pain, shortness of breath, orthopnea, PND, lower extremity edema, dizziness, presyncope, syncope, snoring, daytime somnolence, bleeding, or neurologic sequela. The patient is tolerating medications without difficulties and is otherwise without complaint today.    Atrial Fibrillation Risk Factors:  he does not have symptoms or diagnosis of sleep apnea. he does not have a history of rheumatic fever.   Atrial Fibrillation Management history:  Previous antiarrhythmic drugs: none Previous cardioversions: none Previous ablations: none Anticoagulation history:  Eliquis   ROS- All systems are reviewed and negative except as per the HPI above.  Past Medical History:  Diagnosis Date   Anxiety    Diabetes mellitus without complication (HCC)    Multiple myeloma (HCC) 04/08/2024    Current Outpatient Medications  Medication Sig Dispense Refill   acyclovir  (ZOVIRAX ) 400 MG tablet Take 1 tablet (400 mg total) by mouth 2 (two) times daily. 180 tablet 1   Calcium  Carb-Cholecalciferol (CALCIUM  + D3 PO) Take 1 tablet by mouth See admin instructions. Calcium  600 mg + Vitamin D -3 2,000 units - Take 1 tablet by mouth two to four times a day (Patient taking differently: Take 1 tablet by mouth in the morning, at noon, in the evening, and at bedtime. Calcium  600 mg + Vitamin D -3 2,000 units -)     dexamethasone  (DECADRON ) 4 MG tablet Take 5 tablets (20 mg total) by mouth once a week. Take on the week on Wednesday without Dara injection 30 tablet 1   esomeprazole (NEXIUM) 40 MG capsule Take 40 mg by mouth daily before breakfast.     famotidine  (PEPCID ) 20 MG tablet Take 20 mg by mouth at bedtime.     FLONASE ALLERGY RELIEF 50 MCG/ACT nasal spray Place 1-2 sprays into both nostrils 2 (two) times daily as needed for allergies or rhinitis. (Patient taking differently: Place 1-2 sprays into both nostrils as needed for allergies or rhinitis.)     JARDIANCE  10 MG TABS tablet Take 10 mg by mouth daily.     Multiple Vitamin (MULTIVITAMIN) tablet Take 1 tablet by mouth daily with breakfast.     ondansetron  (ZOFRAN ) 8 MG tablet Take 1 tablet (8 mg total) by mouth every 8 (eight)  hours as needed for nausea or vomiting. 30 tablet 1   ONETOUCH VERIO test strip      oxyCODONE  (OXY IR/ROXICODONE ) 5 MG immediate release tablet Take 1 tablet (5 mg total) by mouth every 8 (eight) hours as needed for severe pain (pain score 7-10). (Patient taking differently: Take 5 mg by mouth as needed for severe pain (pain score 7-10).) 60 tablet 0   rosuvastatin  (CRESTOR ) 10 MG tablet Take 10 mg by  mouth daily.     TURMERIC PO Take 4,800 mg by mouth in the morning and at bedtime.     apixaban  (ELIQUIS ) 5 MG TABS tablet Take 1 tablet (5 mg total) by mouth 2 (two) times daily. 180 tablet 1   diltiazem  (CARDIZEM  CD) 120 MG 24 hr capsule Take 1 capsule (120 mg total) by mouth daily. 90 capsule 1   lenalidomide  (REVLIMID ) 25 MG capsule Take 1 capsule (25 mg total) by mouth daily. For 21 days and off for 7 days for a 28 day cycle.  [name] #[id number]     Date Obtained 09/02/2024 (Patient not taking: Reported on 09/12/2024) 21 capsule 0   No current facility-administered medications for this encounter.    Physical Exam: BP (!) 150/84   Pulse 71   Ht 5' 7 (1.702 m)   Wt 78.3 kg   BMI 27.03 kg/m   GEN: Well nourished, well developed in no acute distress CARDIAC: Irregularly irregular rate and rhythm, no murmurs, rubs, gallops RESPIRATORY:  Clear to auscultation without rales, wheezing or rhonchi  ABDOMEN: Soft, non-tender, non-distended EXTREMITIES:  No edema; No deformity   Wt Readings from Last 3 Encounters:  09/12/24 78.3 kg  09/06/24 78 kg  08/25/24 78 kg     EKG Interpretation Date/Time:  Friday September 12 2024 08:41:59 EST Ventricular Rate:  71 PR Interval:    QRS Duration:  82 QT Interval:  354 QTC Calculation: 384 R Axis:   133  Text Interpretation: Atrial flutter with 4:1 A-V conduction Right axis deviation Septal infarct , age undetermined Abnormal ECG When compared with ECG of 07-Sep-2024 00:59, No significant change was found Confirmed by Quill Grinder ([id number]) on 09/12/2024 8:46:19 AM    Echo 08/26/24 demonstrated   1. Left ventricular ejection fraction, by estimation, is 60 to 65%. The  left ventricle has normal function. The left ventricle has no regional  wall motion abnormalities. Left ventricular diastolic function could not  be evaluated.   2. Right ventricular systolic function is normal. The right ventricular  size is normal.   3. The mitral valve is  normal in structure. No evidence of mitral valve  regurgitation. No evidence of mitral stenosis.   4. The aortic valve is tricuspid. There is mild thickening of the aortic  valve. Aortic valve regurgitation is not visualized. Mild aortic valve  stenosis. Aortic valve Vmax measures 2.03 m/s.   5. The inferior vena cava is normal in size with greater than 50%  respiratory variability, suggesting right atrial pressure of 3 mmHg.    CHA2DS2-VASc Score = 4  The patient's score is based upon: CHF History: 0 HTN History: 1 Diabetes History: 1 Stroke History: 0 Vascular Disease History: 0 Age Score: 2 Gender Score: 0       ASSESSMENT AND PLAN: Paroxysmal Atrial Fibrillation/atrial flutter (ICD10:  I48.0) The patient's CHA2DS2-VASc score is 4, indicating a 4.8% annual risk of stroke.   General education about afib and atrial flutter provided and questions answered. We also discussed his stroke  risk and the risks and benefits of anticoagulation. Patient is in rate controlled atrial flutter today. Will have him wear a 2 weeks Zio monitor to assess his arrhythmia burden. If persistent, can consider DCCV. If paroxysmal, can consider AAD. Amiodarone not ideal with h/o cirrhosis.  Continue Eliquis  5 mg BID Continue diltiazem  120 mg daily. Hesitant to increase AV nodal agent given bradycardia and pauses seen at hospital. His baseline resting HR prior to meds was in the 40s-50s bpm.   Secondary Hypercoagulable State (ICD10:  D68.69) The patient is at significant risk for stroke/thromboembolism based upon his CHA2DS2-VASc Score of 4.  Continue Apixaban  (Eliquis ). No bleeding issues.   HTN Mildly elevated today. Will reassess at follow up.   Multiple Myeloma Currently undergoing treatment He does not use Zofran  on a regular basis.  Followed by Dr Lanny   Follow up in the AF clinic in one month.     [facility] [address] [place], [place] [address] [phone] "

## 2024-09-16 NOTE — Assessment & Plan Note (Signed)
-  IgG kappa type, stage I - Patient with admitted to the hospital on March 29, 2023 for hypercalcemia and diffuse lytic bone lesions on CT and MRI. -Bone marrow biopsy showed plasmacytoma.  Due to the sample limitation, the accurate percentage of plasma cell cannot be determined -Multiple myeloma showed IgG kappa monoclonal protein 1.1g/dl, elevated serum IgG and kappa light chain level with ratio 4.2.  Beta-2 microglobulin 3.0, LDH normal. -Based on the above lab results, diffuse bone lesions, hypercalcemia, mild renal insufficiency and the bone marrow biopsy, patient meets the criteria for multiple myeloma -I recommended induction chemotherapy Dara, Revlimid  and dexa, he started on 04/23/2024

## 2024-09-17 ENCOUNTER — Inpatient Hospital Stay

## 2024-09-17 ENCOUNTER — Inpatient Hospital Stay: Admitting: Hematology

## 2024-09-17 ENCOUNTER — Encounter: Payer: Self-pay | Admitting: Nurse Practitioner

## 2024-09-17 VITALS — BP 130/80 | HR 71 | Temp 98.1°F | Resp 15 | Ht 67.0 in | Wt 174.8 lb

## 2024-09-17 VITALS — BP 141/80 | HR 69 | Temp 98.1°F | Resp 18

## 2024-09-17 DIAGNOSIS — Z515 Encounter for palliative care: Secondary | ICD-10-CM

## 2024-09-17 DIAGNOSIS — G893 Neoplasm related pain (acute) (chronic): Secondary | ICD-10-CM

## 2024-09-17 DIAGNOSIS — C9000 Multiple myeloma not having achieved remission: Secondary | ICD-10-CM

## 2024-09-17 DIAGNOSIS — Z79620 Long term (current) use of immunosuppressive biologic: Secondary | ICD-10-CM | POA: Diagnosis not present

## 2024-09-17 DIAGNOSIS — Z5112 Encounter for antineoplastic immunotherapy: Secondary | ICD-10-CM | POA: Diagnosis present

## 2024-09-17 DIAGNOSIS — R530 Neoplastic (malignant) related fatigue: Secondary | ICD-10-CM

## 2024-09-17 LAB — CBC WITH DIFFERENTIAL (CANCER CENTER ONLY)
Abs Immature Granulocytes: 0.01 K/uL (ref 0.00–0.07)
Basophils Absolute: 0 K/uL (ref 0.0–0.1)
Basophils Relative: 1 %
Eosinophils Absolute: 0.1 K/uL (ref 0.0–0.5)
Eosinophils Relative: 3 %
HCT: 43.5 % (ref 39.0–52.0)
Hemoglobin: 14.6 g/dL (ref 13.0–17.0)
Immature Granulocytes: 0 %
Lymphocytes Relative: 27 %
Lymphs Abs: 1.2 K/uL (ref 0.7–4.0)
MCH: 32.6 pg (ref 26.0–34.0)
MCHC: 33.6 g/dL (ref 30.0–36.0)
MCV: 97.1 fL (ref 80.0–100.0)
Monocytes Absolute: 0.3 K/uL (ref 0.1–1.0)
Monocytes Relative: 6 %
Neutro Abs: 2.7 K/uL (ref 1.7–7.7)
Neutrophils Relative %: 63 %
Platelet Count: 204 K/uL (ref 150–400)
RBC: 4.48 MIL/uL (ref 4.22–5.81)
RDW: 14.6 % (ref 11.5–15.5)
WBC Count: 4.2 K/uL (ref 4.0–10.5)
nRBC: 0 % (ref 0.0–0.2)

## 2024-09-17 LAB — COMPREHENSIVE METABOLIC PANEL WITH GFR
ALT: 12 U/L (ref 0–44)
AST: 16 U/L (ref 15–41)
Albumin: 4.3 g/dL (ref 3.5–5.0)
Alkaline Phosphatase: 83 U/L (ref 38–126)
Anion gap: 10 (ref 5–15)
BUN: 16 mg/dL (ref 8–23)
CO2: 29 mmol/L (ref 22–32)
Calcium: 9.7 mg/dL (ref 8.9–10.3)
Chloride: 103 mmol/L (ref 98–111)
Creatinine, Ser: 0.69 mg/dL (ref 0.61–1.24)
GFR, Estimated: >60 mL/min
Glucose, Bld: 91 mg/dL (ref 70–99)
Potassium: 4.2 mmol/L (ref 3.5–5.1)
Sodium: 142 mmol/L (ref 135–145)
Total Bilirubin: 0.5 mg/dL (ref 0.0–1.2)
Total Protein: 6.6 g/dL (ref 6.5–8.1)

## 2024-09-17 MED ORDER — ACETAMINOPHEN 325 MG PO TABS
650.0000 mg | ORAL_TABLET | Freq: Once | ORAL | Status: AC
  Administered 2024-09-17: 650 mg via ORAL
  Filled 2024-09-17: qty 2

## 2024-09-17 MED ORDER — DIPHENHYDRAMINE HCL 25 MG PO CAPS
25.0000 mg | ORAL_CAPSULE | Freq: Once | ORAL | Status: AC
  Administered 2024-09-17: 25 mg via ORAL
  Filled 2024-09-17: qty 1

## 2024-09-17 MED ORDER — DEXAMETHASONE 6 MG PO TABS
20.0000 mg | ORAL_TABLET | Freq: Once | ORAL | Status: AC
  Administered 2024-09-17: 20 mg via ORAL
  Filled 2024-09-17: qty 2

## 2024-09-17 MED ORDER — PROCHLORPERAZINE MALEATE 10 MG PO TABS: 10.0000 mg | ORAL_TABLET | Freq: Four times a day (QID) | ORAL | 0 refills | Status: AC | PRN

## 2024-09-17 MED ORDER — ZOLEDRONIC ACID 4 MG/100ML IV SOLN
4.0000 mg | Freq: Once | INTRAVENOUS | Status: AC
  Administered 2024-09-17: 4 mg via INTRAVENOUS
  Filled 2024-09-17: qty 100

## 2024-09-17 MED ORDER — DARATUMUMAB-HYALURONIDASE-FIHJ 1800-30000 MG-UT/15ML ~~LOC~~ SOLN
1800.0000 mg | Freq: Once | SUBCUTANEOUS | Status: AC
  Administered 2024-09-17: 1800 mg via SUBCUTANEOUS
  Filled 2024-09-17: qty 15

## 2024-09-17 MED ORDER — OXYCODONE HCL 5 MG PO TABS: 5.0000 mg | ORAL_TABLET | Freq: Three times a day (TID) | ORAL | 0 refills | Status: AC | PRN

## 2024-09-17 MED ORDER — SODIUM CHLORIDE 0.9 % IV SOLN: INTRAVENOUS | Status: DC

## 2024-09-17 NOTE — Progress Notes (Signed)
 "    Palliative Medicine [facility]  Telephone:([phone]) [phone] Fax:(336) [phone]   Name: Joe [name] Date: 09/17/2024 MRN: [id number]  DOB: 18-Mar-1951  Patient Care Team: [name], MD as PCP - General (Family Medicine) Jeffrie Oneil BROCKS, MD as PCP - Cardiology (Cardiology)    INTERVAL HISTORY: Joe [name] is a 74 y.o. male with oncologic medical history including multiple myeloma currently undergoing treatment with Revlimid , dexamethasone  and daratumumab  .  Palliative is seeing patient for symptom management and goals of care.   SOCIAL HISTORY:     reports that he has never smoked. He does not have any smokeless tobacco history on file. He reports that he does not drink alcohol and does not use drugs.  ADVANCE DIRECTIVES:  Patient has a documented directive. Reports his daughter, Joe [name] and niece Joe [name] is his medical decision make   CODE STATUS: Full code  PAST MEDICAL HISTORY: Past Medical History:  Diagnosis Date   Anxiety    Diabetes mellitus without complication (HCC)    Multiple myeloma (HCC) 04/08/2024    ALLERGIES:  is allergic to iodinated contrast media.  MEDICATIONS:  Current Outpatient Medications  Medication Sig Dispense Refill   acyclovir  (ZOVIRAX ) 400 MG tablet Take 1 tablet (400 mg total) by mouth 2 (two) times daily. 180 tablet 1   apixaban  (ELIQUIS ) 5 MG TABS tablet Take 1 tablet (5 mg total) by mouth 2 (two) times daily. 180 tablet 1   Calcium  Carb-Cholecalciferol (CALCIUM  + D3 PO) Take 1 tablet by mouth See admin instructions. Calcium  600 mg + Vitamin D -3 2,000 units - Take 1 tablet by mouth two to four times a day (Patient taking differently: Take 1 tablet by mouth in the morning, at noon, in the evening, and at bedtime. Calcium  600 mg + Vitamin D -3 2,000 units -)     dexamethasone  (DECADRON ) 4 MG tablet Take 5 tablets (20 mg total) by mouth once a week. Take on the week on Wednesday without Dara injection 30 tablet 1   diltiazem   (CARDIZEM  CD) 120 MG 24 hr capsule Take 1 capsule (120 mg total) by mouth daily. 90 capsule 1   esomeprazole (NEXIUM) 40 MG capsule Take 40 mg by mouth daily before breakfast.     famotidine  (PEPCID ) 20 MG tablet Take 20 mg by mouth at bedtime.     FLONASE ALLERGY RELIEF 50 MCG/ACT nasal spray Place 1-2 sprays into both nostrils 2 (two) times daily as needed for allergies or rhinitis. (Patient taking differently: Place 1-2 sprays into both nostrils as needed for allergies or rhinitis.)     JARDIANCE  10 MG TABS tablet Take 10 mg by mouth daily.     lenalidomide  (REVLIMID ) 25 MG capsule Take 1 capsule (25 mg total) by mouth daily. For 21 days and off for 7 days for a 28 day cycle.  [name] #[id number]     Date Obtained 09/02/2024 (Patient not taking: Reported on 09/12/2024) 21 capsule 0   Multiple Vitamin (MULTIVITAMIN) tablet Take 1 tablet by mouth daily with breakfast.     ondansetron  (ZOFRAN ) 8 MG tablet Take 1 tablet (8 mg total) by mouth every 8 (eight) hours as needed for nausea or vomiting. 30 tablet 1   ONETOUCH VERIO test strip      oxyCODONE  (OXY IR/ROXICODONE ) 5 MG immediate release tablet Take 1 tablet (5 mg total) by mouth every 8 (eight) hours as needed for severe pain (pain score 7-10). (Patient taking differently: Take 5 mg by mouth as  needed for severe pain (pain score 7-10).) 60 tablet 0   prochlorperazine  (COMPAZINE ) 10 MG tablet Take 1 tablet (10 mg total) by mouth every 6 (six) hours as needed for nausea or vomiting. 30 tablet 0   rosuvastatin  (CRESTOR ) 10 MG tablet Take 10 mg by mouth daily.     TURMERIC PO Take 4,800 mg by mouth in the morning and at bedtime.     No current facility-administered medications for this visit.    VITAL SIGNS: There were no vitals taken for this visit. There were no vitals filed for this visit.    Estimated body mass index is 27.38 kg/m as calculated from the following:   Height as of an earlier encounter on 09/17/24: 5' 7 (1.702 m).    Weight as of an earlier encounter on 09/17/24: 174 lb 12.8 oz (79.3 kg).   PERFORMANCE STATUS (ECOG) : 1 - Symptomatic but completely ambulatory  Physical Exam General: NAD Cardiovascular: regular rate and rhythm Pulmonary: normal breathing pattern Skin: no rashes Neurological: AAO x3  IMPRESSION: Discussed the use of AI scribe software for clinical note transcription with the patient, who gave verbal consent to proceed.  History of Present Illness  Joe [name] is a 74 year old male with multiple myeloma who was seen during infusion. No acute distress noted. He is accompanied by family. Doing well overall since recent hospitalization.   He has been experiencing atrial fibrillation and was hospitalized twice for this condition. He is currently wearing a heart monitor to track his heart rhythm. No noticeable changes in his condition have been observed, and he has not needed to use the button on his heart monitor yet. Joe [name] shares neither time during his atrial fibrillation event did he feel any significant symptoms of concerns to alert him of the change. He is now carrying a pulse oximeter with him to monitor his heart rate as needed. Now on Eliquis .   Domonick reports occasional back pain and discomfort. Current regimen includes oxycodone  as needed. Does not require daily or around-the-clock use however when he does experience significant discomfort the oxycodone  is effective in provided relief. Refill requested.   No issues with energy levels, constipation, or diarrhea, and his appetite is good. Weight is stable at 174lbs.   All questions answered and support provided.   Goals of Care 06/04/24: We discussed Joe [name] current illness and what it means in the larger context of his on-going co-morbidities. Natural disease trajectory and expectations were discussed.   Patient and his family are realistic in their understanding. He is clear in expressed wishes to continue to treat the  treatable allow him every opportunity to continue to thrive while managing his symptoms and minimizing any discomfort or distress.   Patient confirms he has a completed advanced directive in the home naming his daughter Joe [name] and his niece Joe [name] as his medical decision-maker in the event he is unable to speak for himself.  Mr. Shells would not want his life prolonged in the event of a terminal illness, irreversible causes, or vegetative state.  I encouraged patient to bring in his advance directive to have on file.   I discussed the importance of continued conversation with family and their medical providers regarding overall plan of care and treatment options, ensuring decisions are within the context of the patients values and GOCs. Assessment & Plan Atrial fibrillation Recent episodes requiring hospitalization. Currently asymptomatic with no palpitations or changes in heart rate. Wearing a heart monitor for continuous  monitoring. - Continue wearing heart monitor for atrial fibrillation monitoring -Use of portable finger oximeter to monitor heart rate   Nausea management Nausea previously managed with Zofran , switched to Compazine .. Currently not taking Compazine  but has it available. - Ensure Compazine  is available for nausea management  Cancer Related pain Pain is well controlled with use of oxycodone . Does not require daily use. No adjustments at this time.  -Prescribed oxycodone  5mg  eery 8 hours as needed.   Follow-Up I will plan to see patient back in 4-6 weeks. Sooner if needed.   Patient expressed understanding and was in agreement with this plan. He also understands that He can call the clinic at any time with any questions, concerns, or complaints.   Any controlled substances utilized were prescribed in the context of palliative care. PDMP has been reviewed.   I personally spent a total of 30 minutes in the care of the patient today including preparing to see the patient,  getting/reviewing separately obtained history, performing a medically appropriate exam/evaluation, counseling and educating, placing orders, and documenting clinical information in the EHR. Visit consisted of counseling and education dealing with the complex and emotionally intense issues of symptom management and palliative care in the setting of serious and potentially life-threatening illness.  Levon Borer, AGPCNP-BC  Palliative Medicine Team/[facility]    "

## 2024-09-17 NOTE — Progress Notes (Signed)
 " [facility]   [phone] [phone]) [phone]   Clinic Follow up Note   Patient Care Team: [name], MD as PCP - General (Family Medicine) Jeffrie Oneil BROCKS, MD as PCP - Cardiology (Cardiology)  Date of Service:  09/17/2024  CHIEF COMPLAINT: f/u of MM  CURRENT THERAPY:  Daratumumab  and dexamethasone  every 2 weeks, Revlimid  25mg  day 1-21 every 28 days   Oncology History   Multiple myeloma (HCC) -IgG kappa type, stage I - Patient with admitted to the hospital on March 29, 2023 for hypercalcemia and diffuse lytic bone lesions on CT and MRI. -Bone marrow biopsy showed plasmacytoma.  Due to the sample limitation, the accurate percentage of plasma cell cannot be determined -Multiple myeloma showed IgG kappa monoclonal protein 1.1g/dl, elevated serum IgG and kappa light chain level with ratio 4.2.  Beta-2 microglobulin 3.0, LDH normal. -Based on the above lab results, diffuse bone lesions, hypercalcemia, mild renal insufficiency and the bone marrow biopsy, patient meets the criteria for multiple myeloma -I recommended induction chemotherapy Dara, Revlimid  and dexa, he started on 04/23/2024  Assessment & Plan Multiple myeloma Diagnosed summer 2025, currently with near complete response to therapy as indicated by undetectable or very low M protein and normalized light chain levels. Therapy was briefly interrupted due to acute illness and atrial fibrillation hospitalization, but has resumed. Approaching six months of combination therapy and will soon transition to maintenance oral therapy. Bone marrow biopsy to confirm deep remission was discussed but deferred due to his preference and excellent serologic response. Future relapse will be monitored by blood markers, with options including CAR T therapy, transplant, or re-initiation of injectable agents if needed. - Restarted Revlimid  and arranged additional supply to complete current cycle. - Planned to complete one more  injection (scheduled for February 3rd), then transition to oral maintenance Revlimid . - Ordered blood work to monitor M protein and light chain levels. - Planned ongoing monthly follow-up with labs, with possible extension to every other month if stable. - Discussed and planned repeat 24-hour urine collection within the next month to monitor disease activity. - Deferred bone marrow biopsy at this time due to near complete response and patient preference. - Discussed future relapse management options, including CAR T therapy, transplant, and re-initiation of injectable agents such as Velcade.  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 Prior radiation to two sites for pain and disease control. Currently has mild, intermittent low back pain, primarily in the morning, improved with movement. No pain at rib fracture site. Receiving Zemaira every three months, calcium , and vitamin D  to reduce fracture risk. Kyphoplasty discussed for severe pain but deferred as pain is not currently severe. - Continue Zemaira every three months. - Continue calcium  and vitamin D  supplementation. - Monitor for worsening pain or new symptoms; consider kyphoplasty if vertebral pain becomes severe. - No intervention for healed rib fracture as asymptomatic. - Continue medical management of multiple myeloma to reduce further skeletal complications.   Plan - He is clinically doing well, has recovered well from recent episode of illness - Will proceed daratumumab , dexamethasone  and Zometa  today - Will return in 2 weeks for last dose of Dara/Dex -will check 24hr protein and light chain level  - Continue Revlimid , he will restarted today, plan to change to maintenance dose 10 mg in a month  SUMMARY OF ONCOLOGIC HISTORY: Oncology  History   Multiple myeloma (HCC)  04/08/2024 Initial Diagnosis   Multiple myeloma (HCC)   04/23/2024 - 04/23/2024 Chemotherapy   Patient is on Treatment Plan : MYELOMA NEWLY DIAGNOSED Daratumumab  IV + Lenalidomide  + Dexamethasone  Weekly (DaraRd) q28d     04/23/2024 -  Chemotherapy   Patient is on Treatment Plan : MYELOMA RELAPSED REFRACTORY Daratumumab  SQ + Lenalidomide  + Dexamethasone  (DaraRd) q28d        Discussed the use of AI scribe software for clinical note transcription with the patient, who gave verbal consent to proceed.  History of Present Illness Joe [name] is a 74 year old male with active multiple myeloma who presents for follow-up after recent interruption in therapy due to acute illness and new atrial fibrillation.  He was diagnosed with multiple myeloma in August 2025 with bone lesions including an L4 compression fracture and rib disease. He has been on oral Revlimid  with periodic injections and received prior radiation to the rib and lower back. He also receives Zemaira every three months and takes calcium  and vitamin D . Therapy was paused on August 23, 2024, due to acute illness and he missed his last injection, but he has restarted Revlimid  and is nearing completion of six months of therapy.  He was recently hospitalized with an acute flu-like illness and was found to have new atrial fibrillation. He received IV Cardizem  that caused bradycardia, then was transitioned to oral Cardizem  for rate control. He is now on Eliquis  and has stopped aspirin . He wears a heart monitor with heart rates ranging from 40-155 bpm but has no palpitations, chest pain, or syncope. He feels recovered from the acute illness.  He has intermittent low back pain on rising in the morning, sometimes with spasms that nearly cause falls. Pain improves with movement and is absent during the day. MRI on August 15, 2024, showed multiple bone lesions and an L4 compression fracture. Chest x-ray on August 06, 2024,  showed a healed left rib fracture and a pleurX-based soft tissue lesion previously treated with radiation. He has no current chest or rib pain.  He has Zofran  available but avoids it because of concern for interaction with Cardizem  and atrial fibrillation. He does not have Compazine  but is open to using it if needed. One of three prior blood cultures grew staph epidermidis and was considered a contaminant. Recent blood counts are normal. Kidney and liver function tests are pending.     All other systems were reviewed with the patient and are negative.  MEDICAL HISTORY:  Past Medical History:  Diagnosis Date   Anxiety    Diabetes mellitus without complication (HCC)    Multiple myeloma (HCC) 04/08/2024    SURGICAL HISTORY: No past surgical history on file.  I have reviewed the social history and family history with the patient and they are unchanged from previous note.  ALLERGIES:  is allergic to iodinated contrast media.  MEDICATIONS:  Current Outpatient Medications  Medication Sig Dispense Refill   prochlorperazine  (COMPAZINE ) 10 MG tablet Take 1 tablet (10 mg total) by mouth every 6 (six) hours as needed for nausea or vomiting. 30 tablet 0   acyclovir  (ZOVIRAX ) 400 MG tablet Take 1 tablet (400 mg total) by mouth 2 (two) times daily. 180 tablet 1   apixaban  (ELIQUIS ) 5 MG TABS tablet Take 1 tablet (5 mg total) by mouth 2 (two) times daily. 180 tablet 1   Calcium  Carb-Cholecalciferol (CALCIUM  + D3 PO) Take 1 tablet by mouth See admin instructions. Calcium  600 mg +  Vitamin D -3 2,000 units - Take 1 tablet by mouth two to four times a day (Patient taking differently: Take 1 tablet by mouth in the morning, at noon, in the evening, and at bedtime. Calcium  600 mg + Vitamin D -3 2,000 units -)     dexamethasone  (DECADRON ) 4 MG tablet Take 5 tablets (20 mg total) by mouth once a week. Take on the week on Wednesday without Dara injection 30 tablet 1   diltiazem  (CARDIZEM  CD) 120 MG 24 hr capsule  Take 1 capsule (120 mg total) by mouth daily. 90 capsule 1   esomeprazole (NEXIUM) 40 MG capsule Take 40 mg by mouth daily before breakfast.     famotidine  (PEPCID ) 20 MG tablet Take 20 mg by mouth at bedtime.     FLONASE ALLERGY RELIEF 50 MCG/ACT nasal spray Place 1-2 sprays into both nostrils 2 (two) times daily as needed for allergies or rhinitis. (Patient taking differently: Place 1-2 sprays into both nostrils as needed for allergies or rhinitis.)     JARDIANCE  10 MG TABS tablet Take 10 mg by mouth daily.     lenalidomide  (REVLIMID ) 25 MG capsule Take 1 capsule (25 mg total) by mouth daily. For 21 days and off for 7 days for a 28 day cycle.  [name] #[id number]     Date Obtained 09/02/2024 (Patient not taking: Reported on 09/12/2024) 21 capsule 0   Multiple Vitamin (MULTIVITAMIN) tablet Take 1 tablet by mouth daily with breakfast.     ondansetron  (ZOFRAN ) 8 MG tablet Take 1 tablet (8 mg total) by mouth every 8 (eight) hours as needed for nausea or vomiting. 30 tablet 1   ONETOUCH VERIO test strip      oxyCODONE  (OXY IR/ROXICODONE ) 5 MG immediate release tablet Take 1 tablet (5 mg total) by mouth every 8 (eight) hours as needed for severe pain (pain score 7-10). (Patient taking differently: Take 5 mg by mouth as needed for severe pain (pain score 7-10).) 60 tablet 0   rosuvastatin  (CRESTOR ) 10 MG tablet Take 10 mg by mouth daily.     TURMERIC PO Take 4,800 mg by mouth in the morning and at bedtime.     No current facility-administered medications for this visit.   Facility-Administered Medications Ordered in Other Visits  Medication Dose Route Frequency Provider Last Rate Last Admin   0.9 %  sodium chloride  infusion   Intravenous Continuous Lanny Callander, MD       daratumumab -hyaluronidase -fihj (DARZALEX  FASPRO) 1800-30000 MG-UT/15ML chemo SQ injection 1,800 mg  1,800 mg Subcutaneous Once Lanny Callander, MD       Zoledronic  Acid (ZOMETA ) IVPB 4 mg  4 mg Intravenous Once Lanny Callander, MD         PHYSICAL EXAMINATION: ECOG PERFORMANCE STATUS: 1 - Symptomatic but completely ambulatory  Vitals:   09/17/24 0855  BP: 130/80  Pulse: 71  Resp: 15  Temp: 98.1 F (36.7 C)  SpO2: 95%   Wt Readings from Last 3 Encounters:  09/17/24 174 lb 12.8 oz (79.3 kg)  09/12/24 172 lb 9.6 oz (78.3 kg)  09/06/24 171 lb 15.3 oz (78 kg)     GENERAL:alert, no distress and comfortable SKIN: skin color, texture, turgor are normal, no rashes or significant lesions EYES: normal, Conjunctiva are pink and non-injected, sclera clear NECK: supple, thyroid  normal size, non-tender, without nodularity LYMPH:  no palpable lymphadenopathy in the cervical, axillary  LUNGS: clear to auscultation and percussion with normal breathing effort HEART: regular rate & rhythm and no murmurs and  no lower extremity edema ABDOMEN:abdomen soft, non-tender and normal bowel sounds Musculoskeletal:no cyanosis of digits and no clubbing  NEURO: alert & oriented x 3 with fluent speech, no focal motor/sensory deficits  Physical Exam    LABORATORY DATA:  I have reviewed the data as listed    Latest Ref Rng & Units 09/17/2024    8:35 AM 09/06/2024   10:49 PM 08/27/2024    5:55 AM  CBC  WBC 4.0 - 10.5 K/uL 4.2  9.0  3.9   Hemoglobin 13.0 - 17.0 g/dL 85.3  85.8  86.7   Hematocrit 39.0 - 52.0 % 43.5  41.9  39.0   Platelets 150 - 400 K/uL 204  277  153         Latest Ref Rng & Units 09/17/2024    8:35 AM 09/06/2024   10:49 PM 08/27/2024    5:55 AM  CMP  Glucose 70 - 99 mg/dL 91  829  886   BUN 8 - 23 mg/dL 16  19  32   Creatinine 0.61 - 1.24 mg/dL 9.30  9.36  9.48   Sodium 135 - 145 mmol/L 142  142  142   Potassium 3.5 - 5.1 mmol/L 4.2  4.1  3.9   Chloride 98 - 111 mmol/L 103  109  112   CO2 22 - 32 mmol/L 29  23  21    Calcium  8.9 - 10.3 mg/dL 9.7  9.0  8.0   Total Protein 6.5 - 8.1 g/dL 6.6  5.6    Total Bilirubin 0.0 - 1.2 mg/dL 0.5  0.5    Alkaline Phos 38 - 126 U/L 83  89    AST 15 - 41 U/L 16  23     ALT 0 - 44 U/L 12  20        RADIOGRAPHIC STUDIES: I have personally reviewed the radiological images as listed and agreed with the findings in the report. No results found.    Orders Placed This Encounter  Procedures   24-Hr Ur UPEP/UIFE/Light Chains/TP    Standing Status:   Future    Expected Date:   10/01/2024    Expiration Date:   09/17/2025   All questions were answered. The patient knows to call the clinic with any problems, questions or concerns. No barriers to learning was detected. The total time spent in the appointment was 40 minutes, including review of chart and various tests results, discussions about plan of care and coordination of care plan     Onita Mattock, MD 09/17/2024     "

## 2024-09-17 NOTE — Patient Instructions (Signed)
 [facility] - A DEPT OF [facility]  Discharge Instructions: Thank you for choosing [facility] to provide your oncology and hematology care.   If you have a lab appointment with the Cancer Center, please go directly to the Cancer Center and check in at the registration area.   Wear comfortable clothing and clothing appropriate for easy access to any Portacath or PICC line.   We strive to give you quality time with your provider. You may need to reschedule your appointment if you arrive late (15 or more minutes).  Arriving late affects you and other patients whose appointments are after yours.  Also, if you miss three or more appointments without notifying the office, you may be dismissed from the clinic at the provider's discretion.      For prescription refill requests, have your pharmacy contact our office and allow 72 hours for refills to be completed.    Today you received the following chemotherapy and/or immunotherapy agents Darzalex faspro      To help prevent nausea and vomiting after your treatment, we encourage you to take your nausea medication as directed.  BELOW ARE SYMPTOMS THAT SHOULD BE REPORTED IMMEDIATELY: *FEVER GREATER THAN 100.4 F (38 C) OR HIGHER *CHILLS OR SWEATING *NAUSEA AND VOMITING THAT IS NOT CONTROLLED WITH YOUR NAUSEA MEDICATION *UNUSUAL SHORTNESS OF BREATH *UNUSUAL BRUISING OR BLEEDING *URINARY PROBLEMS (pain or burning when urinating, or frequent urination) *BOWEL PROBLEMS (unusual diarrhea, constipation, pain near the anus) TENDERNESS IN MOUTH AND THROAT WITH OR WITHOUT PRESENCE OF ULCERS (sore throat, sores in mouth, or a toothache) UNUSUAL RASH, SWELLING OR PAIN  UNUSUAL VAGINAL DISCHARGE OR ITCHING   Items with * indicate a potential emergency and should be followed up as soon as possible or go to the Emergency Department if any problems should occur.  Please show the CHEMOTHERAPY ALERT CARD or  IMMUNOTHERAPY ALERT CARD at check-in to the Emergency Department and triage nurse.  Should you have questions after your visit or need to cancel or reschedule your appointment, please contact [facility] - A DEPT OF [facility]  Dept: [phone]  and follow the prompts.  Office hours are 8:00 a.m. to 4:30 p.m. Monday - Friday. Please note that voicemails left after 4:00 p.m. may not be returned until the following business day.  We are closed weekends and major holidays. You have access to a nurse at all times for urgent questions. Please call the main number to the clinic Dept: [phone] and follow the prompts.   For any non-urgent questions, you may also contact your provider using MyChart. We now offer e-Visits for anyone 40 and older to request care online for non-urgent symptoms. For details visit [email or web address].   Also download the MyChart app! Go to the app store, search "MyChart", open the app, select [place], and log in with your MyChart username and password.

## 2024-09-18 LAB — KAPPA/LAMBDA LIGHT CHAINS
Kappa free light chain: 4.6 mg/L (ref 3.3–19.4)
Kappa, lambda light chain ratio: 1.53 (ref 0.26–1.65)
Lambda free light chains: 3 mg/L — ABNORMAL LOW (ref 5.7–26.3)

## 2024-09-20 LAB — MULTIPLE MYELOMA PANEL, SERUM
Albumin SerPl Elph-Mcnc: 3.6 g/dL (ref 2.9–4.4)
Albumin/Glob SerPl: 1.7 (ref 0.7–1.7)
Alpha 1: 0.3 g/dL (ref 0.0–0.4)
Alpha2 Glob SerPl Elph-Mcnc: 0.9 g/dL (ref 0.4–1.0)
B-Globulin SerPl Elph-Mcnc: 0.8 g/dL (ref 0.7–1.3)
Gamma Glob SerPl Elph-Mcnc: 0.3 g/dL — ABNORMAL LOW (ref 0.4–1.8)
Globulin, Total: 2.2 g/dL (ref 2.2–3.9)
IgA: 33 mg/dL — ABNORMAL LOW (ref 61–437)
IgG (Immunoglobin G), Serum: 386 mg/dL — ABNORMAL LOW (ref 603–1613)
IgM (Immunoglobulin M), Srm: 17 mg/dL (ref 15–143)
M Protein SerPl Elph-Mcnc: 0.1 g/dL — ABNORMAL HIGH
Total Protein ELP: 5.8 g/dL — ABNORMAL LOW (ref 6.0–8.5)

## 2024-09-30 ENCOUNTER — Inpatient Hospital Stay

## 2024-09-30 ENCOUNTER — Telehealth: Payer: Self-pay

## 2024-10-03 NOTE — Addendum Note (Signed)
 Encounter addended by: Franchot Glade RAMAN, RN on: 10/03/2024 1:58 PM  Actions taken: Imaging Exam ended

## 2024-10-04 ENCOUNTER — Inpatient Hospital Stay: Attending: Hematology

## 2024-10-04 ENCOUNTER — Inpatient Hospital Stay

## 2024-10-14 ENCOUNTER — Inpatient Hospital Stay

## 2024-10-15 ENCOUNTER — Ambulatory Visit (HOSPITAL_COMMUNITY): Admitting: Physician Assistant
